# Patient Record
Sex: Female | Born: 1975 | Race: Black or African American | Hispanic: No | Marital: Married | Smoking: Current some day smoker
Health system: Southern US, Community
[De-identification: ages and names within clinical notes are randomized; demographics above are authoritative.]

## PROBLEM LIST (undated history)

## (undated) ENCOUNTER — Inpatient Hospital Stay (HOSPITAL_COMMUNITY): Payer: Self-pay

## (undated) DIAGNOSIS — R4781 Slurred speech: Secondary | ICD-10-CM

## (undated) DIAGNOSIS — R739 Hyperglycemia, unspecified: Secondary | ICD-10-CM

## (undated) DIAGNOSIS — O24419 Gestational diabetes mellitus in pregnancy, unspecified control: Secondary | ICD-10-CM

## (undated) DIAGNOSIS — I639 Cerebral infarction, unspecified: Secondary | ICD-10-CM

## (undated) DIAGNOSIS — Z8632 Personal history of gestational diabetes: Secondary | ICD-10-CM

## (undated) DIAGNOSIS — L729 Follicular cyst of the skin and subcutaneous tissue, unspecified: Secondary | ICD-10-CM

## (undated) DIAGNOSIS — I10 Essential (primary) hypertension: Secondary | ICD-10-CM

## (undated) HISTORY — PX: TUBAL LIGATION: SHX77

## (undated) HISTORY — DX: Slurred speech: R47.81

## (undated) HISTORY — DX: Hyperglycemia, unspecified: R73.9

## (undated) HISTORY — DX: Cerebral infarction, unspecified: I63.9

## (undated) HISTORY — DX: Gestational diabetes mellitus in pregnancy, unspecified control: O24.419

---

## 2006-02-25 ENCOUNTER — Other Ambulatory Visit: Payer: Self-pay

## 2012-05-23 DIAGNOSIS — K219 Gastro-esophageal reflux disease without esophagitis: Secondary | ICD-10-CM | POA: Insufficient documentation

## 2012-05-23 DIAGNOSIS — E559 Vitamin D deficiency, unspecified: Secondary | ICD-10-CM | POA: Insufficient documentation

## 2012-09-05 ENCOUNTER — Emergency Department (HOSPITAL_COMMUNITY): Payer: Medicaid Other

## 2012-09-05 ENCOUNTER — Emergency Department (HOSPITAL_COMMUNITY)
Admission: EM | Admit: 2012-09-05 | Discharge: 2012-09-05 | Disposition: A | Discharge status: Home / Self Care | Payer: Medicaid Other | Attending: Emergency Medicine | Admitting: Emergency Medicine

## 2012-09-05 ENCOUNTER — Encounter (HOSPITAL_COMMUNITY): Payer: Self-pay | Admitting: Family Medicine

## 2012-09-05 DIAGNOSIS — N72 Inflammatory disease of cervix uteri: Secondary | ICD-10-CM

## 2012-09-05 DIAGNOSIS — O2390 Unspecified genitourinary tract infection in pregnancy, unspecified trimester: Secondary | ICD-10-CM | POA: Insufficient documentation

## 2012-09-05 DIAGNOSIS — O2690 Pregnancy related conditions, unspecified, unspecified trimester: Secondary | ICD-10-CM | POA: Insufficient documentation

## 2012-09-05 DIAGNOSIS — Z3490 Encounter for supervision of normal pregnancy, unspecified, unspecified trimester: Secondary | ICD-10-CM

## 2012-09-05 LAB — POCT I-STAT, CHEM 8
Chloride: 105 mEq/L (ref 96–112)
HCT: 40 % (ref 36.0–46.0)
Potassium: 4.4 mEq/L (ref 3.5–5.1)
Sodium: 137 mEq/L (ref 135–145)

## 2012-09-05 LAB — CBC
HCT: 38.9 % (ref 36.0–46.0)
MCH: 29.5 pg (ref 26.0–34.0)
MCV: 88.4 fL (ref 78.0–100.0)
RBC: 4.4 MIL/uL (ref 3.87–5.11)
WBC: 6.3 10*3/uL (ref 4.0–10.5)

## 2012-09-05 LAB — URINALYSIS, ROUTINE W REFLEX MICROSCOPIC
Hgb urine dipstick: NEGATIVE
Ketones, ur: 15 mg/dL — AB
Specific Gravity, Urine: 1.031 — ABNORMAL HIGH (ref 1.005–1.030)
Urobilinogen, UA: 1 mg/dL (ref 0.0–1.0)

## 2012-09-05 LAB — URINE MICROSCOPIC-ADD ON

## 2012-09-05 LAB — WET PREP, GENITAL
Trich, Wet Prep: NONE SEEN
Yeast Wet Prep HPF POC: NONE SEEN

## 2012-09-05 LAB — HCG, QUANTITATIVE, PREGNANCY: hCG, Beta Chain, Quant, S: 23216 m[IU]/mL — ABNORMAL HIGH (ref <5)

## 2012-09-05 MED ORDER — AZITHROMYCIN 250 MG PO TABS
1000.0000 mg | ORAL_TABLET | Freq: Once | ORAL | Status: AC
  Administered 2012-09-05: 1000 mg via ORAL
  Filled 2012-09-05: qty 4

## 2012-09-05 MED ORDER — CEFTRIAXONE SODIUM 250 MG IJ SOLR
250.0000 mg | Freq: Once | INTRAMUSCULAR | Status: AC
  Administered 2012-09-05: 250 mg via INTRAMUSCULAR
  Filled 2012-09-05: qty 250

## 2012-09-05 MED ORDER — LIDOCAINE HCL (PF) 1 % IJ SOLN
INTRAMUSCULAR | Status: AC
  Filled 2012-09-05: qty 5

## 2012-09-05 NOTE — ED Provider Notes (Signed)
History     CSN: [id number]  Arrival date & time 09/05/12  1224   First MD Initiated Contact with Patient 09/05/12 1253      Chief Complaint  Patient presents with  . Abdominal Cramping  . Pelvic Pain    (Consider location/radiation/quality/duration/timing/severity/associated sxs/prior treatment) Patient is a 36 y.o. female presenting with cramps. The history is provided by the patient.  Abdominal Cramping The primary symptoms of the illness include abdominal pain. The primary symptoms of the illness do not include fever, nausea, vomiting, diarrhea, dysuria, vaginal discharge or vaginal bleeding. The current episode started more than 2 days ago. The onset of the illness was gradual. The problem has been gradually worsening.  The patient has not had a change in bowel habit. Symptoms associated with the illness do not include chills, anorexia, constipation, urgency, hematuria, frequency or back pain.  Pt states pain is in lower abdomen, cramping. States missed period last month. Last normal menstrual cycle Aug 18th. Did not take any medications for this. Nothing makes pain better or worse.   History reviewed. No pertinent past medical history.  History reviewed. No pertinent past surgical history.  History reviewed. No pertinent family history.  History  Substance Use Topics  . Smoking status: Not on file  . Smokeless tobacco: Not on file  . Alcohol Use: Not on file    OB History    Grav Para Term Preterm Abortions TAB SAB Ect Mult Living                  Review of Systems  Constitutional: Negative for fever and chills.  Respiratory: Negative.   Cardiovascular: Negative.   Gastrointestinal: Positive for abdominal pain. Negative for nausea, vomiting, diarrhea, constipation and anorexia.  Genitourinary: Positive for pelvic pain. Negative for dysuria, urgency, frequency, hematuria, vaginal bleeding, vaginal discharge and difficulty urinating.  Musculoskeletal: Negative for  back pain.  Neurological: Negative for dizziness, weakness and headaches.  Hematological: Negative.     Allergies  Review of patient's allergies indicates no known allergies.  Home Medications  No current outpatient prescriptions on file.  BP 129/72  Pulse 73  Temp 97.1 F (36.2 C) (Oral)  Resp 20  Ht 6' (1.829 m)  Wt 270 lb (122.471 kg)  BMI 36.62 kg/m2  SpO2 97%  LMP 07/22/2012  Physical Exam  Nursing note and vitals reviewed. Constitutional: She is oriented to person, place, and time. She appears well-developed and well-nourished. No distress.  HENT:  Head: Normocephalic.  Eyes: Conjunctivae normal are normal.  Neck: Neck supple.  Cardiovascular: Normal rate, regular rhythm and normal heart sounds.   Pulmonary/Chest: Effort normal and breath sounds normal. No respiratory distress. She has no wheezes. She has no rales.  Abdominal: Soft. Bowel sounds are normal. She exhibits no distension. There is no rebound and no guarding.       Suprapubic tenderness  Genitourinary:       Normal external genitalia. Normal vaginal canal with white chin discharge. Cerivix normal, close. No CMT. Uterine tenderness and bilateral adnexal tenderness present.  Musculoskeletal: She exhibits no edema.  Neurological: She is alert and oriented to person, place, and time.  Skin: Skin is warm and dry.  Psychiatric: She has a normal mood and affect.    ED Course  Procedures (including critical care time)  Results for orders placed during the hospital encounter of 09/05/12  URINALYSIS, ROUTINE W REFLEX MICROSCOPIC      Component Value Range   Color, Urine YELLOW  YELLOW  APPearance CLOUDY (*) CLEAR   Specific Gravity, Urine 1.031 (*) 1.005 - 1.030   pH 6.0  5.0 - 8.0   Glucose, UA NEGATIVE  NEGATIVE mg/dL   Hgb urine dipstick NEGATIVE  NEGATIVE   Bilirubin Urine SMALL (*) NEGATIVE   Ketones, ur 15 (*) NEGATIVE mg/dL   Protein, ur 30 (*) NEGATIVE mg/dL   Urobilinogen, UA 1.0  0.0 - 1.0  mg/dL   Nitrite NEGATIVE  NEGATIVE   Leukocytes, UA NEGATIVE  NEGATIVE  POCT PREGNANCY, URINE      Component Value Range   Preg Test, Ur POSITIVE (*) NEGATIVE  CBC      Component Value Range   WBC 6.3  4.0 - 10.5 K/uL   RBC 4.40  3.87 - 5.11 MIL/uL   Hemoglobin 13.0  12.0 - 15.0 g/dL   HCT 16.1  09.6 - 04.5 %   MCV 88.4  78.0 - 100.0 fL   MCH 29.5  26.0 - 34.0 pg   MCHC 33.4  30.0 - 36.0 g/dL   RDW 40.9  81.1 - 91.4 %   Platelets 283  150 - 400 K/uL  ABO/RH      Component Value Range   ABO/RH(D) B POS     No rh immune globuloin NOT A RH IMMUNE GLOBULIN CANDIDATE, PT RH POSITIVE    WET PREP, GENITAL      Component Value Range   Yeast Wet Prep HPF POC NONE SEEN  NONE SEEN   Trich, Wet Prep NONE SEEN  NONE SEEN   Clue Cells Wet Prep HPF POC NONE SEEN  NONE SEEN   WBC, Wet Prep HPF POC MANY (*) NONE SEEN  URINE MICROSCOPIC-ADD ON      Component Value Range   Squamous Epithelial / LPF RARE  RARE   WBC, UA 0-2  <3 WBC/hpf   RBC / HPF 0-2  <3 RBC/hpf   Bacteria, UA FEW (*) RARE   Crystals CA OXALATE CRYSTALS (*) NEGATIVE   Urine-Other MUCOUS PRESENT    POCT I-STAT, CHEM 8      Component Value Range   Sodium 137  135 - 145 mEq/L   Potassium 4.4  3.5 - 5.1 mEq/L   Chloride 105  96 - 112 mEq/L   BUN 11  6 - 23 mg/dL   Creatinine, Ser 7.82  0.50 - 1.10 mg/dL   Glucose, Bld 84  70 - 99 mg/dL   Calcium, Ion 9.56  2.13 - 1.23 mmol/L   TCO2 22  0 - 100 mmol/L   Hemoglobin 13.6  12.0 - 15.0 g/dL   HCT 08.6  57.8 - 46.9 %    3:19 PM Pt with positive pregnancy test. She is having some abdominal cramping. Estimated gestation about 7wks. Will get [place] to rule out ectopic pregnancy. Pt's vs normal. She is in no distress. No vaginal bleeding.   US Ob Comp Less 14 Wks  09/05/2012  *RADIOLOGY REPORT*  Clinical Data: Early pregnancy.  Evaluate for ectopic.  OBSTETRIC <14 WK [place] AND TRANSVAGINAL OB US  Technique:  Both transabdominal and transvaginal ultrasound examinations were performed for  complete evaluation of the gestation as well as the maternal uterus, adnexal regions, and pelvic cul-de-sac.  Transvaginal technique was performed to assess early pregnancy.  Comparison:  None  Intrauterine gestational sac:  Visualized/normal in shape. Yolk sac: Visualized Embryo: Visualized Cardiac Activity: Visualized Heart Rate: 118 bpm  CRL: 6.9 mm  6 w  4 d  [place] EDC: 04/27/2013  Maternal uterus/adnexae: Both ovaries are within normal limits.  The left ovary contains a probable corpus luteum.  No adnexal mass identified.  No free fluid is seen.  IMPRESSION: Living intrauterine pregnancy with estimated gestational age of [redacted] weeks 4 days and an EDC of 04/27/2013.  Ultrasound for fetal anatomic evaluation at 18 to [redacted] weeks gestational age is recommended.   Original Report Authenticated By: Britta Mccreedy, M.D.    US Ob Transvaginal  09/05/2012  *RADIOLOGY REPORT*  Clinical Data: Early pregnancy.  Evaluate for ectopic.  OBSTETRIC <14 WK [place] AND TRANSVAGINAL OB US  Technique:  Both transabdominal and transvaginal ultrasound examinations were performed for complete evaluation of the gestation as well as the maternal uterus, adnexal regions, and pelvic cul-de-sac.  Transvaginal technique was performed to assess early pregnancy.  Comparison:  None  Intrauterine gestational sac:  Visualized/normal in shape. Yolk sac: Visualized Embryo: Visualized Cardiac Activity: Visualized Heart Rate: 118 bpm  CRL: 6.9 mm  6 w  4 d         [place] EDC: 04/27/2013  Maternal uterus/adnexae: Both ovaries are within normal limits.  The left ovary contains a probable corpus luteum.  No adnexal mass identified.  No free fluid is seen.  IMPRESSION: Living intrauterine pregnancy with estimated gestational age of [redacted] weeks 4 days and an EDC of 04/27/2013.  Ultrasound for fetal anatomic evaluation at 18 to [redacted] weeks gestational age is recommended.   Original Report Authenticated By: Britta Mccreedy, M.D.    4:02 PM Pt's [place] back showing a single  living intrauterine pregnancy at 6wk 4d, no other findings. Will treat for possible cervicitis. Follow up for further prenatal care with GYN.      1. Pregnancy   2. Cervicitis       MDM  Pt with lower abdominal cramping. Positive urine pregnancy. No vaginal bleeding on exam. Cervix closed. US obtained to r/o ectopic pregnancy, and appears normal with single living intrauterine pregnancy at 6wks 4 days. UA negative for infection. VS normal. Pt to d/c home with follow up outpatient.         Lottie Mussel, PA 09/05/12 1612

## 2012-09-05 NOTE — ED Notes (Signed)
Per pt sts lower abdominal pain and cramping. Denies vaginal discharge, bleeding, urinary symptoms. Denies N,V,D. sts LMP August 18th

## 2012-09-05 NOTE — ED Notes (Signed)
Pt transported to US via stretcher.  

## 2012-09-05 NOTE — ED Notes (Signed)
Phlebotomy at the bedside  

## 2012-09-05 NOTE — ED Provider Notes (Signed)
Medical screening examination/treatment/procedure(s) were performed by non-physician practitioner and as supervising physician I was immediately available for consultation/collaboration.  Cheri Guppy, MD 09/05/12 [phone]

## 2012-09-06 LAB — GC/CHLAMYDIA PROBE AMP, GENITAL: GC Probe Amp, Genital: NEGATIVE

## 2012-09-28 ENCOUNTER — Encounter (HOSPITAL_COMMUNITY): Payer: Self-pay

## 2012-09-28 ENCOUNTER — Inpatient Hospital Stay (HOSPITAL_COMMUNITY): Payer: Medicaid Other

## 2012-09-28 ENCOUNTER — Inpatient Hospital Stay (HOSPITAL_COMMUNITY)
Admission: AD | Admit: 2012-09-28 | Discharge: 2012-09-28 | Disposition: A | Discharge status: Home / Self Care | Payer: Medicaid Other | Source: Ambulatory Visit | Attending: Obstetrics & Gynecology | Admitting: Obstetrics & Gynecology

## 2012-09-28 DIAGNOSIS — N760 Acute vaginitis: Secondary | ICD-10-CM

## 2012-09-28 DIAGNOSIS — R109 Unspecified abdominal pain: Secondary | ICD-10-CM | POA: Insufficient documentation

## 2012-09-28 DIAGNOSIS — M549 Dorsalgia, unspecified: Secondary | ICD-10-CM | POA: Insufficient documentation

## 2012-09-28 DIAGNOSIS — B9689 Other specified bacterial agents as the cause of diseases classified elsewhere: Secondary | ICD-10-CM | POA: Insufficient documentation

## 2012-09-28 DIAGNOSIS — O2390 Unspecified genitourinary tract infection in pregnancy, unspecified trimester: Secondary | ICD-10-CM | POA: Insufficient documentation

## 2012-09-28 DIAGNOSIS — A499 Bacterial infection, unspecified: Secondary | ICD-10-CM | POA: Insufficient documentation

## 2012-09-28 DIAGNOSIS — Z9851 Tubal ligation status: Secondary | ICD-10-CM | POA: Insufficient documentation

## 2012-09-28 DIAGNOSIS — Z331 Pregnant state, incidental: Secondary | ICD-10-CM

## 2012-09-28 LAB — CBC
Hemoglobin: 12.4 g/dL (ref 12.0–15.0)
MCHC: 33.3 g/dL (ref 30.0–36.0)
RBC: 4.21 MIL/uL (ref 3.87–5.11)
WBC: 5.5 10*3/uL (ref 4.0–10.5)

## 2012-09-28 LAB — URINALYSIS, ROUTINE W REFLEX MICROSCOPIC
Ketones, ur: NEGATIVE mg/dL
Leukocytes, UA: NEGATIVE
Nitrite: NEGATIVE
Specific Gravity, Urine: 1.025 (ref 1.005–1.030)
pH: 6.5 (ref 5.0–8.0)

## 2012-09-28 LAB — WET PREP, GENITAL
Trich, Wet Prep: NONE SEEN
Yeast Wet Prep HPF POC: NONE SEEN

## 2012-09-28 MED ORDER — METRONIDAZOLE 500 MG PO TABS: 500.0000 mg | ORAL_TABLET | Freq: Two times a day (BID) | ORAL | Status: DC

## 2012-09-28 NOTE — MAU Provider Note (Signed)
History     CSN: [id number]  Arrival date and time: 09/28/12 1036   None     Chief Complaint  Patient presents with  . Abdominal Pain  . Back Pain   HPI Darlene Bennett is 36 y.o. [phone] [redacted]w[redacted]d weeks presenting with lower abdominal and back pain that began last night.  Hx of BTL 2007.  LMP 07/22/12.  Denies nausea, vomiting, diarrhea, constipation, abnormal bleeding or discharge.  Plans care at [facility]    History reviewed. No pertinent past medical history.  No past surgical history on file.  No family history on file.  History  Substance Use Topics  . Smoking status: Not on file  . Smokeless tobacco: Not on file  . Alcohol Use:     Allergies: No Known Allergies  Prescriptions prior to admission  Medication Sig Dispense Refill  . Prenatal Vit-Fe Fumarate-FA (PRENATAL MULTIVITAMIN) TABS Take 1 tablet by mouth daily.        Review of Systems  Constitutional: Negative.   HENT: Negative.   Gastrointestinal: Positive for abdominal pain (cramping). Negative for nausea, vomiting, diarrhea and constipation.  Genitourinary: Negative.        Neg for vaginal bleeding or discharge   Physical Exam   Blood pressure 139/76, pulse 74, temperature 98.5 F (36.9 C), temperature source Oral, resp. rate 18, height 5' 10.5" (1.791 m), weight 128.549 kg (283 lb 6.4 oz), last menstrual period 07/22/2012, SpO2 99.00%.  Physical Exam  Constitutional: She is oriented to person, place, and time. She appears well-developed and well-nourished. No distress.  HENT:  Head: Normocephalic.  Neck: Normal range of motion.  Cardiovascular: Normal rate.   Respiratory: Effort normal.  GI: Soft. She exhibits no distension and no mass. There is no tenderness. There is no rebound and no guarding.  Genitourinary: There is no tenderness or lesion on the right labia. There is no tenderness or lesion on the left labia. Uterus is enlarged (9-10 week size). Uterus is not tender. Right adnexum displays no mass,  no tenderness and no fullness. Left adnexum displays no mass, no tenderness and no fullness. No erythema or bleeding around the vagina. Vaginal discharge (small amount of white discharge with odor) found.  Neurological: She is alert and oriented to person, place, and time.  Skin: Skin is warm and dry.  Psychiatric: She has a normal mood and affect. Her behavior is normal.   Results for orders placed during the hospital encounter of 09/28/12 (from the past 24 hour(s))  URINALYSIS, ROUTINE W REFLEX MICROSCOPIC     Status: Normal   Collection Time   09/28/12 10:55 AM      Component Value Range   Color, Urine YELLOW  YELLOW   APPearance CLEAR  CLEAR   Specific Gravity, Urine 1.025  1.005 - 1.030   pH 6.5  5.0 - 8.0   Glucose, UA NEGATIVE  NEGATIVE mg/dL   Hgb urine dipstick NEGATIVE  NEGATIVE   Bilirubin Urine NEGATIVE  NEGATIVE   Ketones, ur NEGATIVE  NEGATIVE mg/dL   Protein, ur NEGATIVE  NEGATIVE mg/dL   Urobilinogen, UA 0.2  0.0 - 1.0 mg/dL   Nitrite NEGATIVE  NEGATIVE   Leukocytes, UA NEGATIVE  NEGATIVE  CBC     Status: Normal   Collection Time   09/28/12 11:27 AM      Component Value Range   WBC 5.5  4.0 - 10.5 K/uL   RBC 4.21  3.87 - 5.11 MIL/uL   Hemoglobin 12.4  12.0 - 15.0  g/dL   HCT 40.9  81.1 - 91.4 %   MCV 88.4  78.0 - 100.0 fL   MCH 29.5  26.0 - 34.0 pg   MCHC 33.3  30.0 - 36.0 g/dL   RDW 78.2  95.6 - 21.3 %   Platelets 277  150 - 400 K/uL  WET PREP, GENITAL     Status: Abnormal   Collection Time   09/28/12  1:35 PM      Component Value Range   Yeast Wet Prep HPF POC NONE SEEN  NONE SEEN   Trich, Wet Prep NONE SEEN  NONE SEEN   Clue Cells Wet Prep HPF POC FEW (*) NONE SEEN   WBC, Wet Prep HPF POC FEW (*) NONE SEEN     BLOOD TYPE FROM PREVIOUS RECORD IS B POSITIVE   MAU Course  Procedures  GC/CHL culture to lab  MDM   Assessment and Plan  A: Viable Intrauterine pregnancy at [redacted]w[redacted]d      Bacterial Vaginosis  P:  Flagyl 500mg  po bid X 1 week       Encouraged patient to begin prenatal care with doctor of her choice    Tylenol prn for cramping  Bernardine Langworthy,EVE M 09/28/2012, 2:06 PM

## 2012-09-28 NOTE — MAU Note (Signed)
Patient states she started having low back and lower abdominal pain last night that continues off and on. Denies bleeding, discharge, nausea or vomiting.

## 2012-09-29 LAB — GC/CHLAMYDIA PROBE AMP, GENITAL: GC Probe Amp, Genital: NEGATIVE

## 2012-10-02 ENCOUNTER — Other Ambulatory Visit (HOSPITAL_COMMUNITY): Payer: Self-pay | Admitting: Nurse Practitioner

## 2012-10-02 DIAGNOSIS — Z3682 Encounter for antenatal screening for nuchal translucency: Secondary | ICD-10-CM

## 2012-10-02 LAB — OB RESULTS CONSOLE HEPATITIS B SURFACE ANTIGEN: Hepatitis B Surface Ag: NEGATIVE

## 2012-10-02 LAB — OB RESULTS CONSOLE HGB/HCT, BLOOD: HCT: 40 %

## 2012-10-02 LAB — OB RESULTS CONSOLE RPR: RPR: NONREACTIVE

## 2012-10-02 LAB — OB RESULTS CONSOLE ANTIBODY SCREEN: Antibody Screen: NEGATIVE

## 2012-10-02 LAB — OB RESULTS CONSOLE ABO/RH: RH Type: POSITIVE

## 2012-10-02 LAB — OB RESULTS CONSOLE PLATELET COUNT: Platelets: 272 10*3/uL

## 2012-10-02 LAB — OB RESULTS CONSOLE HIV ANTIBODY (ROUTINE TESTING): HIV: NONREACTIVE

## 2012-10-11 DIAGNOSIS — O09529 Supervision of elderly multigravida, unspecified trimester: Secondary | ICD-10-CM

## 2012-10-11 DIAGNOSIS — O09899 Supervision of other high risk pregnancies, unspecified trimester: Secondary | ICD-10-CM

## 2012-10-18 ENCOUNTER — Ambulatory Visit (HOSPITAL_COMMUNITY): Admission: RE | Admit: 2012-10-18 | Payer: Medicaid Other | Source: Ambulatory Visit

## 2012-10-18 ENCOUNTER — Ambulatory Visit (HOSPITAL_COMMUNITY)
Admission: RE | Admit: 2012-10-18 | Discharge: 2012-10-18 | Disposition: A | Discharge status: Home / Self Care | Payer: Medicaid Other | Source: Ambulatory Visit | Attending: Nurse Practitioner | Admitting: Nurse Practitioner

## 2012-10-18 ENCOUNTER — Encounter (HOSPITAL_COMMUNITY): Payer: Self-pay

## 2012-10-18 DIAGNOSIS — O09299 Supervision of pregnancy with other poor reproductive or obstetric history, unspecified trimester: Secondary | ICD-10-CM | POA: Insufficient documentation

## 2012-10-18 DIAGNOSIS — IMO0002 Reserved for concepts with insufficient information to code with codable children: Secondary | ICD-10-CM

## 2012-10-18 DIAGNOSIS — Z8751 Personal history of pre-term labor: Secondary | ICD-10-CM | POA: Insufficient documentation

## 2012-10-18 DIAGNOSIS — Z3682 Encounter for antenatal screening for nuchal translucency: Secondary | ICD-10-CM

## 2012-10-18 DIAGNOSIS — O3510X0 Maternal care for (suspected) chromosomal abnormality in fetus, unspecified, not applicable or unspecified: Secondary | ICD-10-CM | POA: Insufficient documentation

## 2012-10-18 DIAGNOSIS — O09529 Supervision of elderly multigravida, unspecified trimester: Secondary | ICD-10-CM | POA: Insufficient documentation

## 2012-10-18 DIAGNOSIS — Z3689 Encounter for other specified antenatal screening: Secondary | ICD-10-CM

## 2012-10-18 DIAGNOSIS — O351XX0 Maternal care for (suspected) chromosomal abnormality in fetus, not applicable or unspecified: Secondary | ICD-10-CM | POA: Insufficient documentation

## 2012-10-18 NOTE — Progress Notes (Signed)
[facility]  [age] yr old [phone] at [redacted]w[redacted]d with likely chronic hypertension, history of preeclampsia, and previous preterm delivery, for first trimester screen.  Findings: 1. Single intrauterine pregnancy. 2. Fetal crown rump length is consistent with dating. 3. Normal uterus; no adnexal masses seen. 4. Evaluation of fetal anatomy is limited by early gestational age. 5. Normal nuchal translucency measuring 2.49mm. 6. The nasal bone is visualized.  Recommendations: 1. Advanced maternal age:  - meet with genetic counselor; see separate report - after counseling patient declined all aneuploidy screening 2. Hypertension: - no consult requested - recommend fetal growth every 4 weeks starting at [redacted] weeks gestation - recommend antenatal testing starting at [redacted] weeks gestation 3. History of preeclampsia: - no consult requested - recommend obtain baseline 24 hour urine, AST, ALT, CBC, uric acid - recommend close surveillance for the development of signs/symptoms of preeclampsia 4. Possible history of growth restricted baby: - patient is unsure - based on the records appears may have had 4lb14oz baby at 37 weeks - given history of preeclampsia and fetal growth restriction may consider low dose aspirin in this pregnancy to reduce the recurrence risks 5. Recommend fetal anatomic survey at 18-[redacted] weeks gestation  Eulis Foster, MD

## 2012-10-18 NOTE — Progress Notes (Signed)
Genetic Counseling  High-Risk Gestation Note  Appointment Date:  10/18/2012 Referred By: Arvella Nigh, NP Date of Birth:  15-Jul-1976 Partner:  Lonzo Candy     Pregnancy History: [id number] Estimated Date of Delivery: 04/28/13 Estimated Gestational Age: [redacted]w[redacted]d Attending: Eulis Foster, MD   Darlene Bennett and her partner, Mr. Lonzo Candy, were seen for genetic counseling regarding a maternal age of 36 y.o.Marland Kitchen  They were counseled regarding maternal age and the association with risk for chromosome conditions due to nondisjunction with aging of the ova.   We reviewed chromosomes, nondisjunction, and the associated 1 in 67 risk for fetal aneuploidy at [redacted]w[redacted]d gestation related to a maternal age of 36 y.o. at delivery.  They were counseled that the risk for aneuploidy decreases as gestational age increases, accounting for those pregnancies which spontaneously abort.  We specifically discussed Down syndrome (trisomy 14), trisomies 13 and 6, and sex chromosome aneuploidies (47,XXX and 47,XXY) including the common features and prognoses of each.     We reviewed other available screening options including first trimester screening, noninvasive prenatal testing (NIPT), Quad screening, and detailed ultrasound.  Specifically, we discussed that NIPT analyzes cell free fetal DNA found in the maternal circulation. This test is not diagnostic for chromosome conditions, but can provide information regarding the presence or absence of extra fetal DNA for chromosomes 13, 18, 21, X, and Y, and missing fetal DNA for chromosome X and Y (Turner syndrome). Thus, it would not identify or rule out all genetic conditions. The reported detection rate is greater than 99% for Trisomy 21, greater than 98% for Trisomy 18, and is approximately 80% (8 out of 10) for Trisomy 13. The false positive rate is reported to be less than 0.1% for any of these conditions.  In addition, we discussed that ~50-80% of fetuses with Down syndrome and  up to 90-95% of fetuses with trisomy 18/13, when well visualized, have detectable anomalies or soft markers by detailed ultrasound (~18+ weeks gestation).   This couple was also counseled regarding diagnostic testing via chorionic villus sampling (CVS) and amniocentesis.  We reviewed the approximate 1 in 100 risk for complications with CVS and the approximate 1 in 300-500 risk for complications with amniocentesis, including spontaneous pregnancy loss. After consideration of all the options, they elected to proceed with ultrasound only and declined additional screening for aneuploidy at this time including First trimester screening, NIPT, and Quad screening stating that they were comfortable with the age related chance for fetal chromosome conditions at this time. The couple declined CVS and amniocentesis given the associated risk of complications. Ultrasound performed at the time of today's visit visualized nuchal translucency measurement within normal range. Complete ultrasound results reported separately. Detailed ultrasound was scheduled for 12/03/12.   Ms. Claretha Townshend was provided with written information regarding sickle cell anemia (SCA) including the carrier frequency and incidence in the African-American population, the availability of carrier testing and prenatal diagnosis if indicated.  In addition, we discussed that hemoglobinopathies are routinely screened for as part of the [place] newborn screening panel.  She was unsure if this screening had been performed in the past and declined hemoglobin electrophoresis today.    Both family histories were reviewed and found to be noncontributory for birth defects, mental retardation, and known genetic conditions. Without further information regarding the provided family history, an accurate genetic risk cannot be calculated. Further genetic counseling is warranted if more information is obtained.  Ms. Cleere denied exposure to environmental toxins or chemical  agents.  She denied the use of alcohol or street drugs. She reported smoking approximately 1 cigarette per day. The associations of smoking in pregnancy were reviewed and cessation encouraged. She denied significant viral illnesses during the course of her pregnancy. Her medical and surgical histories were contributory for two previous infants with low birth weight.     I counseled this couple regarding the above risks and available options.  The approximate face-to-face time with the genetic counselor was 40 minutes.    Quinn Plowman, MS,  Certified Genetic Counselor 10/18/2012

## 2012-10-19 DIAGNOSIS — O09899 Supervision of other high risk pregnancies, unspecified trimester: Secondary | ICD-10-CM | POA: Insufficient documentation

## 2012-10-22 DIAGNOSIS — Z8632 Personal history of gestational diabetes: Secondary | ICD-10-CM | POA: Insufficient documentation

## 2012-10-22 DIAGNOSIS — O09529 Supervision of elderly multigravida, unspecified trimester: Secondary | ICD-10-CM | POA: Insufficient documentation

## 2012-10-22 DIAGNOSIS — O09299 Supervision of pregnancy with other poor reproductive or obstetric history, unspecified trimester: Secondary | ICD-10-CM | POA: Insufficient documentation

## 2012-10-25 ENCOUNTER — Encounter: Payer: Self-pay | Admitting: Obstetrics & Gynecology

## 2012-10-25 ENCOUNTER — Ambulatory Visit (INDEPENDENT_AMBULATORY_CARE_PROVIDER_SITE_OTHER): Payer: Medicaid Other | Admitting: Obstetrics & Gynecology

## 2012-10-25 VITALS — BP 133/84 | Temp 98.3°F | Wt 294.1 lb

## 2012-10-25 DIAGNOSIS — O0990 Supervision of high risk pregnancy, unspecified, unspecified trimester: Secondary | ICD-10-CM

## 2012-10-25 DIAGNOSIS — O09899 Supervision of other high risk pregnancies, unspecified trimester: Secondary | ICD-10-CM

## 2012-10-25 DIAGNOSIS — O09219 Supervision of pregnancy with history of pre-term labor, unspecified trimester: Secondary | ICD-10-CM

## 2012-10-25 LAB — COMPREHENSIVE METABOLIC PANEL
AST: 33 U/L (ref 0–37)
Albumin: 3.8 g/dL (ref 3.5–5.2)
BUN: 9 mg/dL (ref 6–23)
Calcium: 9.8 mg/dL (ref 8.4–10.5)
Chloride: 104 mEq/L (ref 96–112)
Glucose, Bld: 77 mg/dL (ref 70–99)
Potassium: 4.7 mEq/L (ref 3.5–5.3)
Sodium: 138 mEq/L (ref 135–145)
Total Protein: 7.1 g/dL (ref 6.0–8.3)

## 2012-10-25 LAB — POCT URINALYSIS DIP (DEVICE)
Leukocytes, UA: NEGATIVE
Protein, ur: 100 mg/dL — AB
Urobilinogen, UA: 0.2 mg/dL (ref 0.0–1.0)

## 2012-10-25 NOTE — Progress Notes (Signed)
Pulse- 87 Weight gain 11-20 lbs New ob packet given, cessation material given

## 2012-10-25 NOTE — Patient Instructions (Addendum)
Sterilization Information, Female Female sterilization is a procedure to permanently prevent pregnancy. There are different ways to perform sterilization, but all either block or close the fallopian tubes so that your eggs cannot reach your uterus. If your egg cannot reach your uterus, sperm cannot fertilize the egg, and you cannot get pregnant.  Sterilization is performed by a surgical procedure. Sometimes these procedures are performed in a hospital while a patient is asleep. Sometimes they can be done in a clinic setting with the patient awake. The fallopian tubes can be surgically cut, tied, or sealed through a procedure called tubal ligation. The fallopian tubes can also be closed with clips or rings. Sterilization can also be done by placing a tiny coil into each fallopian tube, which causes scar tissue to grow inside the tube. The scar tissue then blocks the tubes.  Discuss sterilization with your caregiver to answer any concerns you or your partner may have. You may want to ask what type of sterilization your caregiver performs. Some caregivers may not perform all the various options. Sterilization is permanent and should only be done if you are sure you do not want children or do not want any more children. Having a sterilization reversed may not be successful.  STERILIZATION PROCEDURES  Laparoscopic sterilization. This is a surgical method performed at a time other than right after childbirth. Two incisions are made in the lower abdomen. A thin, lighted tube (laparoscope) is inserted into one of the incisions and is used to perform the procedure. The fallopian tubes are closed with a ring or a clip. An instrument that uses heat could be used to seal the tubes closed (electrocautery).   Mini-laparotomy. This is a surgical method done 1 or 2 days after giving birth. Typically, a small incision is made just below the belly button (umbilicus) and the fallopian tubes are exposed. The tubes can then be  sealed, tied, or cut.   Hysteroscopic sterilization. This is performed at a time other than right after childbirth. A tiny, spring-like coil is inserted through the cervix and uterus and placed into the fallopian tubes. The coil causes scaring and blocks the tubes. Other forms of contraception should be used for 3 months after the procedure to allow the scar tissue to form completely. Additionally, it is required hysterosalpingography be done 3 months later to ensure that the procedure was successful. Hysterosalpingography is a procedure that uses X-rays to look at your uterus and fallopian tubes after a material to make them show up better has been inserted. IS STERILIZATION SAFE? Sterilization is considered safe with very rare complications. Risks depend on the type of procedure you have. As with any surgical procedure, there are risks. Some risks of sterilization by any means include:   Bleeding.  Infection.  Reaction to anesthesia medicine.  Injury to surrounding organs. Risks specific to having hysteroscopic coils placed include:  The coils may not be placed correctly the first time.   The coils may move out of place.   The tubes may not get completely blocked after 3 months.   Injury to surrounding organs when placing the coil.  HOW EFFECTIVE IS FEMALE STERILIZATION? Sterilization is nearly 100% effective, but it can fail. Depending on the type of sterilization, the rate of failure can be as high as 3%. After hysteroscopic sterilization with placement of fallopian tube coils, you will need back-up birth control for 3 months after the procedure. Sterilization is effective for a lifetime.  BENEFITS OF STERILIZATION  It does   not affect your hormones, and therefore will not affect your menstrual periods, sexual desire, or performance.   It is effective for a lifetime.   It is safe.   You do not need to worry about getting pregnant. Keep in mind that if you had the  hysteroscopic placement procedure, you must wait 3 months after the procedure (or until your caregiver confirms) before pregnancy is not considered possible.   There are no side effects unlike other types of birth control (contraception).  DRAWBACKS OF STERILIZATION  You must be sure you do not want children or any more children. The procedure is permanent.   It does not provide protection against sexually transmitted infections (STIs).   The tubes can grow back together. If this happens, there is a risk of pregnancy. There is also an increased risk (50%) of pregnancy being an ectopic pregnancy. This is a pregnancy that happens outside of the uterus. Document Released: 05/09/2008 Document Revised: 05/22/2012 Document Reviewed: 03/08/2012 [facility] Patient Information 2013 [place], [place]. Vasovasostomy The sterility from a vasectomy should be thought of as permanent. However, vasovasostomy is a reversal procedure that may restore fertility in some men. It can also relieve chronic pain from vasectomies. This pain occurs very rarely. The procedure may offer some chance for pregnancy even in women over 35. PROCEDURE   The procedure involves sewing together the two tiny ends of two tubes. Each tube has a pinhead-sized opening. This would be the same as you splicing your garden hose, but a very small one.  It can usually be done on an outpatient basis. Patients can usually return to work within one to two weeks. It is more difficult and expensive than a vasectomy. It is even costlier if it involves connecting the vas to the epididymis (another one of the tubes which carries the sperm). This procedure usually takes about three hours.  Reversal surgery is usually not covered by health insurance.  The results may not be known for some time. The surgeon may view the surgical site using magnification instruments. This is called microscopic vasovasostomy. Microscopic techniques may improve the chance  of a reversal's success. Macroscopic vasovasostomy has a slightly lower success rate. However pregnancy rates can still be over 50% with a macroscopic procedure. A macroscopic procedure:  Is less expensive.  Has a shorter operating time. The microscopic approach is preferable for repeat surgery when an initial procedure failed. SUCCESS IS MORE LIKELY WHEN:  The section removed during vasectomy was not long.  The original procedure was performed on straight sections of the vas deferens.  The pieces being joined together are of equal size.  The vasectomy was recent.  The female partner does not change female partners after the procedure. CAUSES OF FAILURE  Various factors play a role in the failure of reversal surgery.  If the sperm count does not recover, it is often due to blockage from scarring in the epididymis. This may be corrected with a second procedure.  The body develops immune system responses that kill its own sperm after a vasectomy. This cannot be reversed.  Immune system responses may injure sperm DNA, contributing to infertility.  Inadequate technology available or specialty care not available.  Remarriage seems to make the procedure less likely to be successful. FREEZING SPERM PRIOR TO VASECTOMY Freezing sperm prior to vasectomy or during vasovasostomy may help if the reversal process fails. This sperm can be used later with artificial methods. Men should discuss these options with their doctor. REVERSAL SURGERY VERSUS  ASSISTED REPRODUCTION  Vasovasostomy is still a better choice than assisted reproductive technologies (ART). Success rates with reversal surgeries are improving. The costs are lower than with ART. In addition, the reversal process does not pose a risk for multiple births. Even for men who have failed reversal procedure, a repeat procedure appears to be less expensive than starting fertility treatments. ART may be a better approach than reversal for men  whose immune system attacks sperm due to vasectomy.  Document Released: 02/11/2003 Document Revised: 02/13/2012 Document Reviewed: 06/11/2008 [facility] Patient Information 2013 [place], [place].

## 2012-10-25 NOTE — Progress Notes (Signed)
Nutrition Note: 1st visit consult Pt with h/o GDM & preeclampsia in previous pregnancies & obesity.  Pt has gained 21.1# @ [redacted]w[redacted]d, which is > expected. Pt reports eating 'all throughout day' & is unsure of how many times. Pt reports drinking water, juice & milk daily and tea & Pepsi occ. Pt reports taking PNV. Pt reports no N&V but heartburn occ. Pt reports NKFA. Pt reports walking some days. Provided pt with verbal & written education on general nutrition during pregnancy & disc tips to decrease heartburn. Disc BF & importance of exclusive BF for as long as she can before introducing formula. Disc wt gain goals of 11-20# or 0.5#/wk. Pt agrees to cont PNV & try to decrease soda/ tea. Pt receives [facility] & plans to BF. F/u if referred Blondell Reveal, MS, RD, LDN

## 2012-10-25 NOTE — Progress Notes (Signed)
Referred from [facility] due to SGA babies. Had GDM and preeclampsia in the past. Negative 1 hr GTT this preg.  Subjective:    Darlene Bennett is a [id number] [redacted]w[redacted]d being seen today for her first obstetrical visit.  Her obstetrical history is significant for advanced maternal age, obesity and history of severe preeclampsia, LBW, gestational diabetes. Failed BTL last pregnancy. Patient does intend to breast feed. Pregnancy history fully reviewed.  Patient reports no complaints.  Filed Vitals:   10/25/12 0918  BP: 133/84  Temp: 98.3 F (36.8 C)  Weight: 294 lb 1.6 oz (133.403 kg)    HISTORY: OB History    Grav Para Term Preterm Abortions TAB SAB Ect Mult Living   4 3 2 1  0 0 0 0 0 3     # Outc Date GA Lbr Len/2nd Wgt Sex Del Anes PTL Lv   1 TRM 2/90 [redacted]w[redacted]d  7lb(3.175kg) F SVD EPI  Yes   2 PRE 3/00 [redacted]w[redacted]d 14:00 4lb5.5oz(1.97kg) M SVD EPI Yes Yes   Comments: Gestational diabetic   3 TRM 3/07 [redacted]w[redacted]d 15:00 5lb0.2oz(2.274kg) M SVD EPI No    4 CUR              Past Medical History  Diagnosis Date  . History of gestational diabetes in prior pregnancy, currently pregnant   . Gestational diabetes    History reviewed. No pertinent past surgical history. Family History  Problem Relation Age of Onset  . Heart disease Mother   . Hypertension Mother   . Diabetes Mother   . Hypertension Father   . Diabetes Father      Exam    Uterus:  Fundal Height: 14 cm  Pelvic Exam:                               System:     Skin: normal coloration and turgor, no rashes    Neurologic: oriented, normal   Extremities: normal strength, tone, and muscle mass       Mouth/Teeth mucous membranes moist, pharynx normal without lesions and dental hygiene good   Neck supple       Respiratory:  appears well, vitals normal, no respiratory distress, acyanotic, normal RR   Abdomen: soft, non-tender; bowel sounds normal; no masses,  no organomegaly          Assessment:    Pregnancy: [id number] Patient Active  Problem List  Diagnosis  . History of preterm delivery, currently pregnant  . AMA (advanced maternal age) multigravida 35+  . History of gestational diabetes in prior pregnancy, currently pregnant  . Supervision of high-risk pregnancy  H/O LBW Normal NT , declines further testing    Plan:     Initial labs drawn. Prenatal vitamins. Problem list reviewed and updated. Genetic Screening discussed First Screen: declined.  Ultrasound discussed; fetal survey: requested.  Follow up in 3 weeks. 50% of 30 min visit spent on counseling and coordination of care.  CMP and 24 hr urine   Darlene Bennett 10/25/2012

## 2012-10-27 LAB — PROTEIN, URINE, 24 HOUR: Protein, Urine: 17 mg/dL

## 2012-11-15 ENCOUNTER — Ambulatory Visit (INDEPENDENT_AMBULATORY_CARE_PROVIDER_SITE_OTHER): Payer: Medicaid Other | Admitting: Obstetrics & Gynecology

## 2012-11-15 VITALS — BP 138/83 | Temp 97.4°F | Wt 299.8 lb

## 2012-11-15 DIAGNOSIS — O09529 Supervision of elderly multigravida, unspecified trimester: Secondary | ICD-10-CM

## 2012-11-15 DIAGNOSIS — O09219 Supervision of pregnancy with history of pre-term labor, unspecified trimester: Secondary | ICD-10-CM

## 2012-11-15 DIAGNOSIS — O09899 Supervision of other high risk pregnancies, unspecified trimester: Secondary | ICD-10-CM

## 2012-11-15 DIAGNOSIS — O09299 Supervision of pregnancy with other poor reproductive or obstetric history, unspecified trimester: Secondary | ICD-10-CM | POA: Insufficient documentation

## 2012-11-15 DIAGNOSIS — O10019 Pre-existing essential hypertension complicating pregnancy, unspecified trimester: Secondary | ICD-10-CM

## 2012-11-15 DIAGNOSIS — O0990 Supervision of high risk pregnancy, unspecified, unspecified trimester: Secondary | ICD-10-CM

## 2012-11-15 DIAGNOSIS — I10 Essential (primary) hypertension: Secondary | ICD-10-CM | POA: Insufficient documentation

## 2012-11-15 LAB — POCT URINALYSIS DIP (DEVICE)
Ketones, ur: NEGATIVE mg/dL
Leukocytes, UA: NEGATIVE
Nitrite: NEGATIVE
Protein, ur: NEGATIVE mg/dL

## 2012-11-15 MED ORDER — ASPIRIN 81 MG PO TBEC: 81.0000 mg | DELAYED_RELEASE_TABLET | Freq: Every day | ORAL | Status: DC

## 2012-11-15 NOTE — Progress Notes (Signed)
p-87 

## 2012-11-15 NOTE — Progress Notes (Signed)
Discussed all recommendations of MFM to patient, patient wants to start Aspirin 81 mg po daily, this was prescribed.  No other complaints or concerns. Preeclampsia and obstetric precautions reviewed.

## 2012-11-15 NOTE — Patient Instructions (Signed)

## 2012-12-03 ENCOUNTER — Ambulatory Visit (HOSPITAL_COMMUNITY)
Admission: RE | Admit: 2012-12-03 | Discharge: 2012-12-03 | Disposition: A | Discharge status: Home / Self Care | Payer: Medicaid Other | Source: Ambulatory Visit | Attending: Obstetrics & Gynecology | Admitting: Obstetrics & Gynecology

## 2012-12-03 VITALS — BP 134/82 | HR 89 | Wt 297.0 lb

## 2012-12-03 DIAGNOSIS — O99210 Obesity complicating pregnancy, unspecified trimester: Secondary | ICD-10-CM | POA: Insufficient documentation

## 2012-12-03 DIAGNOSIS — IMO0002 Reserved for concepts with insufficient information to code with codable children: Secondary | ICD-10-CM

## 2012-12-03 DIAGNOSIS — Z3689 Encounter for other specified antenatal screening: Secondary | ICD-10-CM

## 2012-12-03 DIAGNOSIS — O09299 Supervision of pregnancy with other poor reproductive or obstetric history, unspecified trimester: Secondary | ICD-10-CM | POA: Insufficient documentation

## 2012-12-03 DIAGNOSIS — Z1389 Encounter for screening for other disorder: Secondary | ICD-10-CM | POA: Insufficient documentation

## 2012-12-03 DIAGNOSIS — Z8751 Personal history of pre-term labor: Secondary | ICD-10-CM | POA: Insufficient documentation

## 2012-12-03 DIAGNOSIS — Z363 Encounter for antenatal screening for malformations: Secondary | ICD-10-CM | POA: Insufficient documentation

## 2012-12-03 DIAGNOSIS — E669 Obesity, unspecified: Secondary | ICD-10-CM | POA: Insufficient documentation

## 2012-12-03 DIAGNOSIS — I10 Essential (primary) hypertension: Secondary | ICD-10-CM

## 2012-12-03 DIAGNOSIS — O10019 Pre-existing essential hypertension complicating pregnancy, unspecified trimester: Secondary | ICD-10-CM

## 2012-12-03 DIAGNOSIS — O358XX0 Maternal care for other (suspected) fetal abnormality and damage, not applicable or unspecified: Secondary | ICD-10-CM | POA: Insufficient documentation

## 2012-12-03 DIAGNOSIS — O09529 Supervision of elderly multigravida, unspecified trimester: Secondary | ICD-10-CM | POA: Insufficient documentation

## 2012-12-03 DIAGNOSIS — O09899 Supervision of other high risk pregnancies, unspecified trimester: Secondary | ICD-10-CM

## 2012-12-03 NOTE — Progress Notes (Signed)
Darlene Bennett  was seen today for an ultrasound appointment.  See full report in [facility].  Impression: Single IUP at 19 1/7 weeks Normal detailed fetal anatomy; somewhat limited views of the fetal heart were obtained due to fetal position (ductus, aortic arch) No markers associated with Down syndrome noted Normal amniotic fluid volume  Recommendations: Recommend follow-up ultrasound examination in 4 weeks for interval growth and to reevaluate fetal heart anatomy  Alpha Gula, MD

## 2012-12-05 NOTE — L&D Delivery Note (Signed)
Delivery Note At 1:11 PM a viable female was delivered via Vaginal, Spontaneous Delivery (Presentation: Occiput Anterior).  APGAR: 8, 9; weight .   Placenta status: Intact, Spontaneous.  Cord: 3 vessels with the following complications: loose nuchal, delivered through.  Cord pH: n/a  Anesthesia: Epidural  Episiotomy: None Lacerations: None Suture Repair: n/a Est. Blood Loss (mL): 250 cc  Mom to postpartum.  Baby to nursery-stable.  Levert Feinstein 04/10/2013, 1:33 PM

## 2012-12-05 NOTE — L&D Delivery Note (Signed)
Attended delivery and I agree with note  04/10/2013 4:15 PM Adam Phenix, MD

## 2012-12-06 ENCOUNTER — Ambulatory Visit (INDEPENDENT_AMBULATORY_CARE_PROVIDER_SITE_OTHER): Payer: Medicaid Other | Admitting: Obstetrics & Gynecology

## 2012-12-06 VITALS — BP 134/82 | Temp 97.6°F | Wt 295.8 lb

## 2012-12-06 DIAGNOSIS — O09299 Supervision of pregnancy with other poor reproductive or obstetric history, unspecified trimester: Secondary | ICD-10-CM

## 2012-12-06 DIAGNOSIS — O10019 Pre-existing essential hypertension complicating pregnancy, unspecified trimester: Secondary | ICD-10-CM

## 2012-12-06 DIAGNOSIS — Z8632 Personal history of gestational diabetes: Secondary | ICD-10-CM

## 2012-12-06 DIAGNOSIS — O09529 Supervision of elderly multigravida, unspecified trimester: Secondary | ICD-10-CM

## 2012-12-06 DIAGNOSIS — I10 Essential (primary) hypertension: Secondary | ICD-10-CM

## 2012-12-06 DIAGNOSIS — O0990 Supervision of high risk pregnancy, unspecified, unspecified trimester: Secondary | ICD-10-CM

## 2012-12-06 LAB — POCT URINALYSIS DIP (DEVICE)
Hgb urine dipstick: NEGATIVE
Leukocytes, UA: NEGATIVE
Nitrite: NEGATIVE
Protein, ur: NEGATIVE mg/dL
Urobilinogen, UA: 0.2 mg/dL (ref 0.0–1.0)
pH: 7 (ref 5.0–8.0)

## 2012-12-06 NOTE — Patient Instructions (Signed)
Return to clinic for any obstetric concerns or go to [facility] for evaluation  

## 2012-12-06 NOTE — Progress Notes (Signed)
Pulse: 88

## 2012-12-06 NOTE — Progress Notes (Signed)
Normal anatomy scan but inadequate cardiac visualization, scheduled for rescan at 23 weeks. No other complaints or concerns.   Obstetric precautions reviewed.

## 2012-12-27 ENCOUNTER — Ambulatory Visit (INDEPENDENT_AMBULATORY_CARE_PROVIDER_SITE_OTHER): Payer: Medicaid Other | Admitting: Obstetrics & Gynecology

## 2012-12-27 VITALS — BP 130/84 | Wt 294.6 lb

## 2012-12-27 DIAGNOSIS — O09899 Supervision of other high risk pregnancies, unspecified trimester: Secondary | ICD-10-CM

## 2012-12-27 DIAGNOSIS — O09219 Supervision of pregnancy with history of pre-term labor, unspecified trimester: Secondary | ICD-10-CM

## 2012-12-27 LAB — POCT URINALYSIS DIP (DEVICE)
Leukocytes, UA: NEGATIVE
Protein, ur: 30 mg/dL — AB
Urobilinogen, UA: 1 mg/dL (ref 0.0–1.0)
pH: 6.5 (ref 5.0–8.0)

## 2012-12-27 NOTE — Progress Notes (Signed)
BP nml today.  Not on meds.  Has f/u US next week with MFM to follow up anatomy.  Discussed brith control (pt had failed BTL at [facility]).  Maybe vasectomy for FOB.

## 2012-12-27 NOTE — Progress Notes (Signed)
Pulse 83 Has a tingling feeling in her hands in the morning. Concerned with why she doesn't feel movement yet.

## 2012-12-27 NOTE — Patient Instructions (Signed)
Sterilization Information, Female Female sterilization is a procedure to permanently prevent pregnancy. There are different ways to perform sterilization, but all either block or close the fallopian tubes so that your eggs cannot reach your uterus. If your egg cannot reach your uterus, sperm cannot fertilize the egg, and you cannot get pregnant.  Sterilization is performed by a surgical procedure. Sometimes these procedures are performed in a hospital while a patient is asleep. Sometimes they can be done in a clinic setting with the patient awake. The fallopian tubes can be surgically cut, tied, or sealed through a procedure called tubal ligation. The fallopian tubes can also be closed with clips or rings. Sterilization can also be done by placing a tiny coil into each fallopian tube, which causes scar tissue to grow inside the tube. The scar tissue then blocks the tubes.  Discuss sterilization with your caregiver to answer any concerns you or your partner may have. You may want to ask what type of sterilization your caregiver performs. Some caregivers may not perform all the various options. Sterilization is permanent and should only be done if you are sure you do not want children or do not want any more children. Having a sterilization reversed may not be successful.  STERILIZATION PROCEDURES  Laparoscopic sterilization. This is a surgical method performed at a time other than right after childbirth. Two incisions are made in the lower abdomen. A thin, lighted tube (laparoscope) is inserted into one of the incisions and is used to perform the procedure. The fallopian tubes are closed with a ring or a clip. An instrument that uses heat could be used to seal the tubes closed (electrocautery).   Mini-laparotomy. This is a surgical method done 1 or 2 days after giving birth. Typically, a small incision is made just below the belly button (umbilicus) and the fallopian tubes are exposed. The tubes can then be  sealed, tied, or cut.   Hysteroscopic sterilization. This is performed at a time other than right after childbirth. A tiny, spring-like coil is inserted through the cervix and uterus and placed into the fallopian tubes. The coil causes scaring and blocks the tubes. Other forms of contraception should be used for 3 months after the procedure to allow the scar tissue to form completely. Additionally, it is required hysterosalpingography be done 3 months later to ensure that the procedure was successful. Hysterosalpingography is a procedure that uses X-rays to look at your uterus and fallopian tubes after a material to make them show up better has been inserted. IS STERILIZATION SAFE? Sterilization is considered safe with very rare complications. Risks depend on the type of procedure you have. As with any surgical procedure, there are risks. Some risks of sterilization by any means include:   Bleeding.  Infection.  Reaction to anesthesia medicine.  Injury to surrounding organs. Risks specific to having hysteroscopic coils placed include:  The coils may not be placed correctly the first time.   The coils may move out of place.   The tubes may not get completely blocked after 3 months.   Injury to surrounding organs when placing the coil.  HOW EFFECTIVE IS FEMALE STERILIZATION? Sterilization is nearly 100% effective, but it can fail. Depending on the type of sterilization, the rate of failure can be as high as 3%. After hysteroscopic sterilization with placement of fallopian tube coils, you will need back-up birth control for 3 months after the procedure. Sterilization is effective for a lifetime.  BENEFITS OF STERILIZATION  It does   not affect your hormones, and therefore will not affect your menstrual periods, sexual desire, or performance.   It is effective for a lifetime.   It is safe.   You do not need to worry about getting pregnant. Keep in mind that if you had the  hysteroscopic placement procedure, you must wait 3 months after the procedure (or until your caregiver confirms) before pregnancy is not considered possible.   There are no side effects unlike other types of birth control (contraception).  DRAWBACKS OF STERILIZATION  You must be sure you do not want children or any more children. The procedure is permanent.   It does not provide protection against sexually transmitted infections (STIs).   The tubes can grow back together. If this happens, there is a risk of pregnancy. There is also an increased risk (50%) of pregnancy being an ectopic pregnancy. This is a pregnancy that happens outside of the uterus. Document Released: 05/09/2008 Document Revised: 05/22/2012 Document Reviewed: 03/08/2012 [facility] Patient Information 2013 [place], [place]. Contraception Choices Contraception (birth control) is the use of any methods or devices to prevent pregnancy. Below are some methods to help avoid pregnancy. HORMONAL METHODS   Contraceptive implant. This is a thin, plastic tube containing progesterone hormone. It does not contain estrogen hormone. Your caregiver inserts the tube in the inner part of the upper arm. The tube can remain in place for up to 3 years. After 3 years, the implant must be removed. The implant prevents the ovaries from releasing an egg (ovulation), thickens the cervical mucus which prevents sperm from entering the uterus, and thins the lining of the inside of the uterus.  Progesterone-only injections. These injections are given every 3 months by your caregiver to prevent pregnancy. This synthetic progesterone hormone stops the ovaries from releasing eggs. It also thickens cervical mucus and changes the uterine lining. This makes it harder for sperm to survive in the uterus.  Birth control pills. These pills contain estrogen and progesterone hormone. They work by stopping the egg from forming in the ovary (ovulation). Birth control  pills are prescribed by a caregiver.Birth control pills can also be used to treat heavy periods.  Minipill. This type of birth control pill contains only the progesterone hormone. They are taken every day of each month and must be prescribed by your caregiver.  Birth control patch. The patch contains hormones similar to those in birth control pills. It must be changed once a week and is prescribed by a caregiver.  Vaginal ring. The ring contains hormones similar to those in birth control pills. It is left in the vagina for 3 weeks, removed for 1 week, and then a new one is put back in place. The patient must be comfortable inserting and removing the ring from the vagina.A caregiver's prescription is necessary.  Emergency contraception. Emergency contraceptives prevent pregnancy after unprotected sexual intercourse. This pill can be taken right after sex or up to 5 days after unprotected sex. It is most effective the sooner you take the pills after having sexual intercourse. Emergency contraceptive pills are available without a prescription. Check with your pharmacist. Do not use emergency contraception as your only form of birth control. BARRIER METHODS   Female condom. This is a thin sheath (latex or rubber) that is worn over the penis during sexual intercourse. It can be used with spermicide to increase effectiveness.  Female condom. This is a soft, loose-fitting sheath that is put into the vagina before sexual intercourse.  Diaphragm. This is  a soft, latex, dome-shaped barrier that must be fitted by a caregiver. It is inserted into the vagina, along with a spermicidal jelly. It is inserted before intercourse. The diaphragm should be left in the vagina for 6 to 8 hours after intercourse.  Cervical cap. This is a round, soft, latex or plastic cup that fits over the cervix and must be fitted by a caregiver. The cap can be left in place for up to 48 hours after intercourse.  Sponge. This is a soft,  circular piece of polyurethane foam. The sponge has spermicide in it. It is inserted into the vagina after wetting it and before sexual intercourse.  Spermicides. These are chemicals that kill or block sperm from entering the cervix and uterus. They come in the form of creams, jellies, suppositories, foam, or tablets. They do not require a prescription. They are inserted into the vagina with an applicator before having sexual intercourse. The process must be repeated every time you have sexual intercourse. INTRAUTERINE CONTRACEPTION  Intrauterine device (IUD). This is a T-shaped device that is put in a woman's uterus during a menstrual period to prevent pregnancy. There are 2 types:  Copper IUD. This type of IUD is wrapped in copper wire and is placed inside the uterus. Copper makes the uterus and fallopian tubes produce a fluid that kills sperm. It can stay in place for 10 years.  Hormone IUD. This type of IUD contains the hormone progestin (synthetic progesterone). The hormone thickens the cervical mucus and prevents sperm from entering the uterus, and it also thins the uterine lining to prevent implantation of a fertilized egg. The hormone can weaken or kill the sperm that get into the uterus. It can stay in place for 5 years. PERMANENT METHODS OF CONTRACEPTION  Female tubal ligation. This is when the woman's fallopian tubes are surgically sealed, tied, or blocked to prevent the egg from traveling to the uterus.  Female sterilization. This is when the female has the tubes that carry sperm tied off (vasectomy).This blocks sperm from entering the vagina during sexual intercourse. After the procedure, the man can still ejaculate fluid (semen). NATURAL PLANNING METHODS  Natural family planning. This is not having sexual intercourse or using a barrier method (condom, diaphragm, cervical cap) on days the woman could become pregnant.  Calendar method. This is keeping track of the length of each menstrual  cycle and identifying when you are fertile.  Ovulation method. This is avoiding sexual intercourse during ovulation.  Symptothermal method. This is avoiding sexual intercourse during ovulation, using a thermometer and ovulation symptoms.  Post-ovulation method. This is timing sexual intercourse after you have ovulated. Regardless of which type or method of contraception you choose, it is important that you use condoms to protect against the transmission of sexually transmitted diseases (STDs). Talk with your caregiver about which form of contraception is most appropriate for you. Document Released: 11/21/2005 Document Revised: 02/13/2012 Document Reviewed: 03/30/2011 [facility] Patient Information 2013 [place], [place].

## 2012-12-31 ENCOUNTER — Ambulatory Visit (HOSPITAL_COMMUNITY)
Admission: RE | Admit: 2012-12-31 | Discharge: 2012-12-31 | Disposition: A | Discharge status: Home / Self Care | Payer: Medicaid Other | Source: Ambulatory Visit | Attending: Obstetrics & Gynecology | Admitting: Obstetrics & Gynecology

## 2012-12-31 ENCOUNTER — Encounter (HOSPITAL_COMMUNITY): Payer: Self-pay

## 2012-12-31 VITALS — BP 126/84 | HR 113 | Wt 294.0 lb

## 2012-12-31 DIAGNOSIS — O10019 Pre-existing essential hypertension complicating pregnancy, unspecified trimester: Secondary | ICD-10-CM

## 2012-12-31 DIAGNOSIS — IMO0002 Reserved for concepts with insufficient information to code with codable children: Secondary | ICD-10-CM

## 2012-12-31 DIAGNOSIS — O09899 Supervision of other high risk pregnancies, unspecified trimester: Secondary | ICD-10-CM

## 2012-12-31 DIAGNOSIS — I10 Essential (primary) hypertension: Secondary | ICD-10-CM

## 2012-12-31 DIAGNOSIS — E669 Obesity, unspecified: Secondary | ICD-10-CM | POA: Insufficient documentation

## 2012-12-31 DIAGNOSIS — O09529 Supervision of elderly multigravida, unspecified trimester: Secondary | ICD-10-CM

## 2012-12-31 DIAGNOSIS — Z8751 Personal history of pre-term labor: Secondary | ICD-10-CM | POA: Insufficient documentation

## 2012-12-31 DIAGNOSIS — O09299 Supervision of pregnancy with other poor reproductive or obstetric history, unspecified trimester: Secondary | ICD-10-CM

## 2013-01-17 ENCOUNTER — Encounter: Payer: Medicaid Other | Admitting: Obstetrics & Gynecology

## 2013-01-23 ENCOUNTER — Telehealth: Payer: Self-pay | Admitting: General Practice

## 2013-01-23 NOTE — Telephone Encounter (Signed)
Patient called and left message stating she would like a nurse to call her back. Called patient back and patient states she has been having tingling in her hands for the past couple weeks. Upon chart review patient voiced this concern at last prenatal visit, but don't see where the provider made a note. Told patient I didn't think it was anything urgent and that she should bring it up to the provider herself at her prenatal visit next week on 2/27. Patient verbalized understanding and had no further questions

## 2013-01-24 ENCOUNTER — Encounter (HOSPITAL_COMMUNITY): Payer: Self-pay

## 2013-01-24 ENCOUNTER — Inpatient Hospital Stay (HOSPITAL_COMMUNITY)
Admission: AD | Admit: 2013-01-24 | Discharge: 2013-01-24 | Disposition: A | Discharge status: Home / Self Care | Payer: Medicaid Other | Source: Ambulatory Visit | Attending: Obstetrics & Gynecology | Admitting: Obstetrics & Gynecology

## 2013-01-24 DIAGNOSIS — G5601 Carpal tunnel syndrome, right upper limb: Secondary | ICD-10-CM | POA: Diagnosis present

## 2013-01-24 DIAGNOSIS — O09899 Supervision of other high risk pregnancies, unspecified trimester: Secondary | ICD-10-CM

## 2013-01-24 DIAGNOSIS — O99891 Other specified diseases and conditions complicating pregnancy: Secondary | ICD-10-CM | POA: Insufficient documentation

## 2013-01-24 DIAGNOSIS — O09529 Supervision of elderly multigravida, unspecified trimester: Secondary | ICD-10-CM

## 2013-01-24 DIAGNOSIS — O09299 Supervision of pregnancy with other poor reproductive or obstetric history, unspecified trimester: Secondary | ICD-10-CM

## 2013-01-24 DIAGNOSIS — O10019 Pre-existing essential hypertension complicating pregnancy, unspecified trimester: Secondary | ICD-10-CM | POA: Insufficient documentation

## 2013-01-24 DIAGNOSIS — G5600 Carpal tunnel syndrome, unspecified upper limb: Secondary | ICD-10-CM | POA: Insufficient documentation

## 2013-01-24 DIAGNOSIS — R209 Unspecified disturbances of skin sensation: Secondary | ICD-10-CM | POA: Insufficient documentation

## 2013-01-24 NOTE — MAU Note (Signed)
Pt states r hand has numbness and tingling intermittently. At times will feel whole hand numbness, wrist pain earlier, no hx carpal tunnel. Denies abnormal vaginal discharge or bleeding.

## 2013-01-24 NOTE — MAU Provider Note (Signed)
History     CSN: [id number]  Arrival date and time: 01/24/13 1738   None     Chief Complaint  Patient presents with  . Numbness   HPI  37 year old F [id number] @ [redacted]w[redacted]d by LMP who is seen in high risk clinic for AMA, chronic HTN and history of pre-eclampsia who presents with right hand numbness and tingling. It has been present for about one month and is worsening. It effects her volar thumb, pointer and middle fingers. It is not associated with swelling or erythema of the hand. She denies a history of trauma or surgery to the RUE. The patient notes regular fetal movement and denies vaginal bleeding, LOF and contractions.    OB History   Grav Para Term Preterm Abortions TAB SAB Ect Mult Living   4 3 2 1  0 0 0 0 0 3      Patient Active Problem List  Diagnosis  . History of preterm delivery, currently pregnant  . AMA (advanced maternal age) multigravida 35+  . History of gestational diabetes in prior pregnancy, currently pregnant  . Supervision of high-risk pregnancy  . Benign essential hypertension antepartum  . Chronic hypertension  . Hx of preeclampsia, prior pregnancy, currently pregnant     Past Medical History  Diagnosis Date  . History of gestational diabetes in prior pregnancy, currently pregnant   . Gestational diabetes     Past Surgical History  Procedure Laterality Date  . Tubal ligation      Family History  Problem Relation Age of Onset  . Heart disease Mother   . Hypertension Mother   . Diabetes Mother   . Hypertension Father   . Diabetes Father     History  Substance Use Topics  . Smoking status: Current Every Day Smoker    Types: Cigarettes  . Smokeless tobacco: Never Used  . Alcohol Use: No    Allergies: No Known Allergies  Prescriptions prior to admission  Medication Sig Dispense Refill  . acetaminophen (TYLENOL) 500 MG tablet Take 500 mg by mouth every 6 (six) hours as needed.      Marland Kitchen aspirin 81 MG EC tablet Take 1 tablet (81 mg total)  by mouth daily. Swallow whole.  30 tablet  12  . Prenatal Vit-Fe Fumarate-FA (PRENATAL MULTIVITAMIN) TABS Take 1 tablet by mouth daily.        Review of Systems  Constitutional: Negative.   HENT: Negative.   Eyes: Negative.   Respiratory: Negative.   Cardiovascular: Negative.   Gastrointestinal: Negative.   Neurological: Positive for tingling.   Physical Exam   Blood pressure 129/77, pulse 99, temperature 98 F (36.7 C), temperature source Oral, resp. rate 18, height 6' (1.829 m), weight 135.796 kg (299 lb 6 oz), last menstrual period 07/22/2012.  Physical Exam  Constitutional: She is oriented to person, place, and time. She appears well-developed and well-nourished.  Obese; poor hygiene   HENT:  Head: Normocephalic and atraumatic.  Eyes: Pupils are equal, round, and reactive to light.  Cardiovascular: Normal rate and regular rhythm.   Respiratory: Effort normal and breath sounds normal.  GI: Soft. There is no tenderness.  Gravid  Neurological: She is alert and oriented to person, place, and time. She has normal strength.  Positive Phalen's sign of right wrist; no edema or erythema of right wrist; no atrophy of thenar comparment    MAU Course  Procedures  MDM Toco - no contractions FHM - Cat I tracing  Assessment and Plan  37 year old F [phone] @ [redacted]w[redacted]d by LMP who is seen in high risk clinic for AMA, chronic HTN and history of pre-eclampsia who presents with right hand numbness and tingling that is carpal tunnel syndrome. The patient was explained the diagnosis and will use a wrist splint on the right. Regular follow up in high risk clinic.    Mat Carne 01/24/2013, 8:25 PM   I have seen and examined this patient and agree the above assessment. CRESENZO-DISHMAN,Hema Lanza 01/25/2013 12:58 AM

## 2013-01-31 ENCOUNTER — Ambulatory Visit (INDEPENDENT_AMBULATORY_CARE_PROVIDER_SITE_OTHER): Payer: Medicaid Other | Admitting: Obstetrics & Gynecology

## 2013-01-31 ENCOUNTER — Other Ambulatory Visit: Payer: Self-pay | Admitting: Obstetrics & Gynecology

## 2013-01-31 VITALS — BP 134/84 | Temp 97.4°F | Wt 292.7 lb

## 2013-01-31 DIAGNOSIS — O09529 Supervision of elderly multigravida, unspecified trimester: Secondary | ICD-10-CM

## 2013-01-31 LAB — CBC
MCH: 30.3 pg (ref 26.0–34.0)
MCHC: 35.8 g/dL (ref 30.0–36.0)
MCV: 84.6 fL (ref 78.0–100.0)
Platelets: 325 10*3/uL (ref 150–400)

## 2013-01-31 LAB — POCT URINALYSIS DIP (DEVICE)
Ketones, ur: NEGATIVE mg/dL
Leukocytes, UA: NEGATIVE
Protein, ur: 30 mg/dL — AB
Specific Gravity, Urine: 1.025 (ref 1.005–1.030)
Urobilinogen, UA: 0.2 mg/dL (ref 0.0–1.0)
pH: 7 (ref 5.0–8.0)

## 2013-01-31 LAB — HIV ANTIBODY (ROUTINE TESTING W REFLEX): HIV: NONREACTIVE

## 2013-01-31 MED ORDER — PANTOPRAZOLE SODIUM 40 MG PO TBEC: 40.0000 mg | DELAYED_RELEASE_TABLET | Freq: Every day | ORAL | Status: DC

## 2013-01-31 NOTE — Progress Notes (Signed)
Heartburn, good movement.Rx Protonix

## 2013-01-31 NOTE — Patient Instructions (Signed)
Pregnancy - Third Trimester  The third trimester of pregnancy (the last 3 months) is a period of the most rapid growth for you and your baby. The baby approaches a length of 20 inches and a weight of 6 to 10 pounds. The baby is adding on fat and getting ready for life outside your body. While inside, babies have periods of sleeping and waking, suck their thumbs, and hiccups. You can often feel small contractions of the uterus. This is false labor. It is also called Braxton-Hicks contractions. This is like a practice for labor. The usual problems in this stage of pregnancy include more difficulty breathing, swelling of the hands and feet from water retention, and having to urinate more often because of the uterus and baby pressing on your bladder.   PRENATAL EXAMS  · Blood work may continue to be done during prenatal exams. These tests are done to check on your health and the probable health of your baby. Blood work is used to follow your blood levels (hemoglobin). Anemia (low hemoglobin) is common during pregnancy. Iron and vitamins are given to help prevent this. You may also continue to be checked for diabetes. Some of the past blood tests may be done again.  · The size of the uterus is measured during each visit. This makes sure your baby is growing properly according to your pregnancy dates.  · Your blood pressure is checked every prenatal visit. This is to make sure you are not getting toxemia.  · Your urine is checked every prenatal visit for infection, diabetes and protein.  · Your weight is checked at each visit. This is done to make sure gains are happening at the suggested rate and that you and your baby are growing normally.  · Sometimes, an ultrasound is performed to confirm the position and the proper growth and development of the baby. This is a test done that bounces harmless sound waves off the baby so your caregiver can more accurately determine due dates.  · Discuss the type of pain medication and  anesthesia you will have during your labor and delivery.  · Discuss the possibility and anesthesia if a Cesarean Section might be necessary.  · Inform your caregiver if there is any mental or physical violence at home.  Sometimes, a specialized non-stress test, contraction stress test and biophysical profile are done to make sure the baby is not having a problem. Checking the amniotic fluid surrounding the baby is called an amniocentesis. The amniotic fluid is removed by sticking a needle into the belly (abdomen). This is sometimes done near the end of pregnancy if an early delivery is required. In this case, it is done to help make sure the baby's lungs are mature enough for the baby to live outside of the womb. If the lungs are not mature and it is unsafe to deliver the baby, an injection of cortisone medication is given to the mother 1 to 2 days before the delivery. This helps the baby's lungs mature and makes it safer to deliver the baby.  CHANGES OCCURING IN THE THIRD TRIMESTER OF PREGNANCY  Your body goes through many changes during pregnancy. They vary from person to person. Talk to your caregiver about changes you notice and are concerned about.  · During the last trimester, you have probably had an increase in your appetite. It is normal to have cravings for certain foods. This varies from person to person and pregnancy to pregnancy.  · You may begin to   get stretch marks on your hips, abdomen, and breasts. These are normal changes in the body during pregnancy. There are no exercises or medications to take which prevent this change.  · Constipation may be treated with a stool softener or adding bulk to your diet. Drinking lots of fluids, fiber in vegetables, fruits, and whole grains are helpful.  · Exercising is also helpful. If you have been very active up until your pregnancy, most of these activities can be continued during your pregnancy. If you have been less active, it is helpful to start an exercise  program such as walking. Consult your caregiver before starting exercise programs.  · Avoid all smoking, alcohol, un-prescribed drugs, herbs and "street drugs" during your pregnancy. These chemicals affect the formation and growth of the baby. Avoid chemicals throughout the pregnancy to ensure the delivery of a healthy infant.  · Backache, varicose veins and hemorrhoids may develop or get worse.  · You will tire more easily in the third trimester, which is normal.  · The baby's movements may be stronger and more often.  · You may become short of breath easily.  · Your belly button may stick out.  · A yellow discharge may leak from your breasts called colostrum.  · You may have a bloody mucus discharge. This usually occurs a few days to a week before labor begins.  HOME CARE INSTRUCTIONS   · Keep your caregiver's appointments. Follow your caregiver's instructions regarding medication use, exercise, and diet.  · During pregnancy, you are providing food for you and your baby. Continue to eat regular, well-balanced meals. Choose foods such as meat, fish, milk and other low fat dairy products, vegetables, fruits, and whole-grain breads and cereals. Your caregiver will tell you of the ideal weight gain.  · A physical sexual relationship may be continued throughout pregnancy if there are no other problems such as early (premature) leaking of amniotic fluid from the membranes, vaginal bleeding, or belly (abdominal) pain.  · Exercise regularly if there are no restrictions. Check with your caregiver if you are unsure of the safety of your exercises. Greater weight gain will occur in the last 2 trimesters of pregnancy. Exercising helps:  · Control your weight.  · Get you in shape for labor and delivery.  · You lose weight after you deliver.  · Rest a lot with legs elevated, or as needed for leg cramps or low back pain.  · Wear a good support or jogging bra for breast tenderness during pregnancy. This may help if worn during  sleep. Pads or tissues may be used in the bra if you are leaking colostrum.  · Do not use hot tubs, steam rooms, or saunas.  · Wear your seat belt when driving. This protects you and your baby if you are in an accident.  · Avoid raw meat, cat litter boxes and soil used by cats. These carry germs that can cause birth defects in the baby.  · It is easier to loose urine during pregnancy. Tightening up and strengthening the pelvic muscles will help with this problem. You can practice stopping your urination while you are going to the bathroom. These are the same muscles you need to strengthen. It is also the muscles you would use if you were trying to stop from passing gas. You can practice tightening these muscles up 10 times a set and repeating this about 3 times per day. Once you know what muscles to tighten up, do not perform these   exercises during urination. It is more likely to cause an infection by backing up the urine.  · Ask for help if you have financial, counseling or nutritional needs during pregnancy. Your caregiver will be able to offer counseling for these needs as well as refer you for other special needs.  · Make a list of emergency phone numbers and have them available.  · Plan on getting help from family or friends when you go home from the hospital.  · Make a trial run to the hospital.  · Take prenatal classes with the father to understand, practice and ask questions about the labor and delivery.  · Prepare the baby's room/nursery.  · Do not travel out of the city unless it is absolutely necessary and with the advice of your caregiver.  · Wear only low or no heal shoes to have better balance and prevent falling.  MEDICATIONS AND DRUG USE IN PREGNANCY  · Take prenatal vitamins as directed. The vitamin should contain 1 milligram of folic acid. Keep all vitamins out of reach of children. Only a couple vitamins or tablets containing iron may be fatal to a baby or young child when ingested.  · Avoid use  of all medications, including herbs, over-the-counter medications, not prescribed or suggested by your caregiver. Only take over-the-counter or prescription medicines for pain, discomfort, or fever as directed by your caregiver. Do not use aspirin, ibuprofen (Motrin®, Advil®, Nuprin®) or naproxen (Aleve®) unless OK'd by your caregiver.  · Let your caregiver also know about herbs you may be using.  · Alcohol is related to a number of birth defects. This includes fetal alcohol syndrome. All alcohol, in any form, should be avoided completely. Smoking will cause low birth rate and premature babies.  · Street/illegal drugs are very harmful to the baby. They are absolutely forbidden. A baby born to an addicted mother will be addicted at birth. The baby will go through the same withdrawal an adult does.  SEEK MEDICAL CARE IF:  You have any concerns or worries during your pregnancy. It is better to call with your questions if you feel they cannot wait, rather than worry about them.  DECISIONS ABOUT CIRCUMCISION  You may or may not know the sex of your baby. If you know your baby is a boy, it may be time to think about circumcision. Circumcision is the removal of the foreskin of the penis. This is the skin that covers the sensitive end of the penis. There is no proven medical need for this. Often this decision is made on what is popular at the time or based upon religious beliefs and social issues. You can discuss these issues with your caregiver or pediatrician.  SEEK IMMEDIATE MEDICAL CARE IF:   · An unexplained oral temperature above 102° F (38.9° C) develops, or as your caregiver suggests.  · You have leaking of fluid from the vagina (birth canal). If leaking membranes are suspected, take your temperature and tell your caregiver of this when you call.  · There is vaginal spotting, bleeding or passing clots. Tell your caregiver of the amount and how many pads are used.  · You develop a bad smelling vaginal discharge with  a change in the color from clear to white.  · You develop vomiting that lasts more than 24 hours.  · You develop chills or fever.  · You develop shortness of breath.  · You develop burning on urination.  · You loose more than 2 pounds of weight   or gain more than 2 pounds of weight or as suggested by your caregiver.  · You notice sudden swelling of your face, hands, and feet or legs.  · You develop belly (abdominal) pain. Round ligament discomfort is a common non-cancerous (benign) cause of abdominal pain in pregnancy. Your caregiver still must evaluate you.  · You develop a severe headache that does not go away.  · You develop visual problems, blurred or double vision.  · If you have not felt your baby move for more than 1 hour. If you think the baby is not moving as much as usual, eat something with sugar in it and lie down on your left side for an hour. The baby should move at least 4 to 5 times per hour. Call right away if your baby moves less than that.  · You fall, are in a car accident or any kind of trauma.  · There is mental or physical violence at home.  Document Released: 11/15/2001 Document Revised: 02/13/2012 Document Reviewed: 05/20/2009  [employer]® Patient Information ©2013 [employer].

## 2013-01-31 NOTE — Progress Notes (Signed)
Pulse- 82  Edema-legs

## 2013-02-01 LAB — GLUCOSE TOLERANCE, 1 HOUR (50G) W/O FASTING: Glucose, 1 Hour GTT: 175 mg/dL — ABNORMAL HIGH (ref 70–140)

## 2013-02-06 ENCOUNTER — Telehealth: Payer: Self-pay | Admitting: General Practice

## 2013-02-06 NOTE — Telephone Encounter (Signed)
Patient called back, I informed her of results and appt time. Patient said she would be there. Reminded patient nothing to eat or drink after midnight and to plan to be here for 3 hours. Patient verbalized understanding & had no further questions

## 2013-02-06 NOTE — Telephone Encounter (Signed)
Called patient, no answer- left message to call us back at the clinics for appt information

## 2013-02-06 NOTE — Telephone Encounter (Signed)
Message copied by Kathee Delton on Wed Feb 06, 2013  1:55 PM ------      Message from: Odelia Gage A      Created: Tue Feb 05, 2013 10:28 AM      Regarding:  3 Hr appt        Appointment is for 02/12/13 @ 8:15                  ----- Message -----         From: Adam Phenix, MD         Sent: 02/04/2013   9:03 AM           To: [facility] Admin Pool            abnl 1 hr, needs 3 hr GTT       ------

## 2013-02-07 ENCOUNTER — Other Ambulatory Visit: Payer: Self-pay | Admitting: Obstetrics & Gynecology

## 2013-02-07 DIAGNOSIS — O09529 Supervision of elderly multigravida, unspecified trimester: Secondary | ICD-10-CM

## 2013-02-11 ENCOUNTER — Ambulatory Visit (HOSPITAL_COMMUNITY)
Admission: RE | Admit: 2013-02-11 | Discharge: 2013-02-11 | Disposition: A | Discharge status: Home / Self Care | Payer: Medicaid Other | Source: Ambulatory Visit | Attending: Obstetrics & Gynecology | Admitting: Obstetrics & Gynecology

## 2013-02-11 VITALS — BP 123/83 | HR 106 | Wt 292.0 lb

## 2013-02-11 DIAGNOSIS — O10019 Pre-existing essential hypertension complicating pregnancy, unspecified trimester: Secondary | ICD-10-CM | POA: Insufficient documentation

## 2013-02-11 DIAGNOSIS — O09299 Supervision of pregnancy with other poor reproductive or obstetric history, unspecified trimester: Secondary | ICD-10-CM | POA: Insufficient documentation

## 2013-02-11 DIAGNOSIS — E669 Obesity, unspecified: Secondary | ICD-10-CM | POA: Insufficient documentation

## 2013-02-11 DIAGNOSIS — Z8751 Personal history of pre-term labor: Secondary | ICD-10-CM | POA: Insufficient documentation

## 2013-02-11 DIAGNOSIS — I10 Essential (primary) hypertension: Secondary | ICD-10-CM

## 2013-02-11 DIAGNOSIS — O09529 Supervision of elderly multigravida, unspecified trimester: Secondary | ICD-10-CM

## 2013-02-11 NOTE — Progress Notes (Addendum)
[facility] ultrasound  37 yr old [phone] at [redacted]w[redacted]d with chronic hypertension, history of preeclampsia, and previous preterm delivery, for fetal growth ultrasound.  Findings: 1. Single intrauterine pregnancy. 2. Estimated fetal weight is in the 54th%. 3. Posterior placenta without evidence of previa. 4. Normal amniotic fluid volume. 5. The limited anatomy survey is normal.  Recommendations: 1. Advanced maternal age:  - previously counseled - patient declined all aneuploidy screening 2. Hypertension: - well controlled off of medication - recommend fetal growth every 4 weeks  - recommend antenatal testing starting at [redacted] weeks gestation - recommend delivery by estimated due date but not prior to 38 weeks in the absence of other complications 3. History of preeclampsia: - no consult requested - normal baseline labs - recommend close surveillance for the development of signs/symptoms of preeclampsia 4. Possible history of growth restricted baby: - patient is unsure - based on the records appears may have had 4lb14oz baby at 37 weeks - given history of preeclampsia and fetal growth restriction may consider low dose aspirin in this pregnancy to reduce the recurrence risks 5. Had elevated 1 hour glucola: is getting 3 hr gtt tomorrow  Eulis Foster, MD

## 2013-02-12 ENCOUNTER — Other Ambulatory Visit: Payer: Medicaid Other

## 2013-02-12 DIAGNOSIS — R7309 Other abnormal glucose: Secondary | ICD-10-CM

## 2013-02-13 LAB — GLUCOSE TOLERANCE, 3 HOURS
Glucose Tolerance, Fasting: 130 mg/dL — ABNORMAL HIGH (ref 70–104)
Glucose, GTT - 3 Hour: 183 mg/dL — ABNORMAL HIGH (ref 70–144)

## 2013-02-14 ENCOUNTER — Encounter: Payer: Self-pay | Admitting: Obstetrics & Gynecology

## 2013-02-14 ENCOUNTER — Encounter: Payer: Self-pay | Admitting: Advanced Practice Midwife

## 2013-02-14 ENCOUNTER — Ambulatory Visit (INDEPENDENT_AMBULATORY_CARE_PROVIDER_SITE_OTHER): Payer: Medicaid Other | Admitting: Advanced Practice Midwife

## 2013-02-14 VITALS — BP 138/83 | Temp 96.8°F | Wt 293.5 lb

## 2013-02-14 DIAGNOSIS — O24419 Gestational diabetes mellitus in pregnancy, unspecified control: Secondary | ICD-10-CM

## 2013-02-14 DIAGNOSIS — O10019 Pre-existing essential hypertension complicating pregnancy, unspecified trimester: Secondary | ICD-10-CM

## 2013-02-14 HISTORY — DX: Gestational diabetes mellitus in pregnancy, unspecified control: O24.419

## 2013-02-14 LAB — POCT URINALYSIS DIP (DEVICE)
Glucose, UA: NEGATIVE mg/dL
Ketones, ur: NEGATIVE mg/dL
Specific Gravity, Urine: 1.025 (ref 1.005–1.030)
Urobilinogen, UA: 0.2 mg/dL (ref 0.0–1.0)

## 2013-02-14 NOTE — Progress Notes (Signed)
Pulse- 85  Edema-ankles

## 2013-02-14 NOTE — Patient Instructions (Signed)
Gestational Diabetes Mellitus Gestational diabetes mellitus (GDM) is diabetes that occurs only during pregnancy. This happens when the body cannot properly handle the glucose (sugar) that increases in the blood after eating. During pregnancy, insulin resistance (reduced sensitivity to insulin) occurs because of the release of hormones from the placenta. Usually, the pancreas of pregnant women produces enough insulin to overcome the resistance that occurs. However, in gestational diabetes, the insulin is there but it does not work effectively. If the resistance is severe enough that the pancreas does not produce enough insulin, extra glucose builds up in the blood.  WHO IS AT RISK FOR DEVELOPING GESTATIONAL DIABETES?  Women with a history of diabetes in the family.  Women over age 69.  Women who are overweight. Women in certain ethnic groups (Hispanic, African AmerPregnancy - Third Trimester The third trimester begins at the 28th week of pregnancy and ends at birth. It is important to follow your doctor's instructions. HOME CARE   Go to your doctor's visits.  Do not smoke.  Do not drink alcohol or use drugs.  Only take medicine as told by your doctor.  Take prenatal vitamins as told. The vitamin should contain 1 milligram of folic acid.  Exercise.  Eat healthy foods. Eat regular, well-balanced meals.  You can have sex (intercourse) if there are no other problems with the pregnancy.  Do not use hot tubs, steam rooms, or saunas.  Wear a seat belt while driving.  Avoid raw meat, uncooked cheese, and litter boxes and soil used by cats.  Rest with your legs raised (elevated).  Make a list of emergency phone numbers. Keep this list with you.  Arrange for help when you come back home after delivering the baby.  Make a trial run to the hospital.  Take prenatal classes.  Prepare the baby's nursery.  Do not travel out of the city. If you absolutely have to, get permission from  your doctor first.  Wear flat shoes. Do not wear high heels. GET HELP RIGHT AWAY IF:   You have a temperature by mouth above 102 F (38.9 C), not controlled by medicine.  You have not felt the baby move for more than 1 hour. If you think the baby is not moving as much as normal, eat something with sugar in it or lie down on your left side for an hour. The baby should move at least 4 to 5 times per hour.  Fluid is coming from the vagina.  Blood is coming from the vagina. Light spotting is common, especially after sex (intercourse).  You have belly (abdominal) pain.  You have a bad smelling fluid (discharge) coming from the vagina. The fluid changes from clear to white.  You still feel sick to your stomach (nauseous).  You throw up (vomit) for more than 24 hours.  You have the chills.  You have shortness of breath.  You have a burning feeling when you pee (urinate).  You lose or gain more than 2 pounds (0.9 kilograms) of weight over a week, or as told by your doctor.  Your face, hands, feet, or legs get puffy (swell).  You have a bad headache that will not go away.  You start to have problems seeing (blurry or double vision).  You fall, are in a car accident, or have any kind of trauma.  There is mental or physical violence at home.  You have any concerns or worries during your pregnancy. MAKE SURE YOU:   Understand these instructions.  Will watch your condition.  Will get help right away if you are not doing well or get worse. Document Released: 02/15/2010 Document Revised: 02/13/2012 Document Reviewed: 02/15/2010 [facility] Patient Information 2013 [place], [place].  ican, Native American, [place] and [place] Islander). WHAT CAN HAPPEN TO THE BABY? If the mother's blood glucose is too high while she is pregnant, the extra sugar will travel through the umbilical cord to the baby. Some of the problems the baby may have are:  Large Baby - If the baby receives too much  sugar, the baby will gain more weight. This may cause the baby to be too large to be born normally (vaginally) and a Cesarean section (C-section) may be needed.  Low Blood Glucose (hypoglycemia)  The baby makes extra insulin, in response to the extra sugar its gets from its mother. When the baby is born and no longer needs this extra insulin, the baby's blood glucose level may drop.  Jaundice (yellow coloring of the skin and eyes)  This is fairly common in babies. It is caused from a build-up of the chemical called bilirubin. This is rarely serious, but is seen more often in babies whose mothers had gestational diabetes. RISKS TO THE MOTHER Women who have had gestational diabetes may be at higher risk for some problems, including:  Preeclampsia or toxemia, which includes problems with high blood pressure. Blood pressure and protein levels in the urine must be checked frequently.  Infections.  Cesarean section (C-section) for delivery.  Developing Type 2 diabetes later in life. About 30-50% will develop diabetes later, especially if obese. DIAGNOSIS  The hormones that cause insulin resistance are highest at about 24-28 weeks of pregnancy. If symptoms are experienced, they are much like symptoms you would normally expect during pregnancy.  GDM is often diagnosed using a two part method: 1. After 24-28 weeks of pregnancy, the woman drinks a glucose solution and takes a blood test. If the glucose level is high, a second test will be given. 2. Oral Glucose Tolerance Test (OGTT) which is 3 hours long  After not eating overnight, the blood glucose is checked. The woman drinks a glucose solution, and hourly blood glucose tests are taken. If the woman has risk factors for GDM, the caregiver may test earlier than 24 weeks of pregnancy. TREATMENT  Treatment of GDM is directed at keeping the mother's blood glucose level normal, and may include:  Meal planning.  Taking insulin or other medicine to  control your blood glucose level.  Exercise.  Keeping a daily record of the foods you eat.  Blood glucose monitoring and keeping a record of your blood glucose levels.  May monitor ketone levels in the urine, although this is no longer considered necessary in most pregnancies. HOME CARE INSTRUCTIONS  While you are pregnant:  Follow your caregiver's advice regarding your prenatal appointments, meal planning, exercise, medicines, vitamins, blood and other tests, and physical activities.  Keep a record of your meals, blood glucose tests, and the amount of insulin you are taking (if any). Show this to your caregiver at every prenatal visit.  If you have GDM, you may have problems with hypoglycemia (low blood glucose). You may suspect this if you become suddenly dizzy, feel shaky, and/or weak. If you think this is happening and you have a glucose meter, try to test your blood glucose level. Follow your caregiver's advice for when and how to treat your low blood glucose. Generally, the 15:15 rule is followed: Treat by consuming 15 grams  of carbohydrates, wait 15 minutes, and recheck blood glucose. Examples of 15 grams of carbohydrates are:  1 cup skim or low-fat milk.   cup juice.  3-4 glucose tablets.  5-6 hard candies.  1 small box raisins.   cup regular soda pop.  Practice good hygiene, to avoid infections.  Do not smoke. SEEK MEDICAL CARE IF:   You develop abnormal vaginal discharge, with or without itching.  You become weak and tired more than expected.  You seem to sweat a lot.  You have a sudden increase in weight, 5 pounds or more in one week.  You are losing weight, 3 pounds or more in a week.  Your blood glucose level is high, and you need instructions on what to do about it. SEEK IMMEDIATE MEDICAL CARE IF:   You develop a severe headache.  You faint or pass out.  You develop nausea and vomiting.  You become disoriented or confused.  You have a  convulsion.  You develop vision problems.  You develop stomach pain.  You develop vaginal bleeding.  You develop uterine contractions.  You have leaking or a gush of fluid from the vagina. AFTER YOU HAVE THE BABY:  Go to all of your follow-up appointments, and have blood tests as advised by your caregiver.  Maintain a healthy lifestyle, to prevent diabetes in the future. This includes:  Following a healthy meal plan.  Controlling your weight.  Getting enough exercise and proper rest.  Do not smoke.  Breastfeed your baby if you can. This will lower the chance of you and your baby developing diabetes later in life. For more information about diabetes, go to the American Diabetes Association at: PMFashions.com.cy. For more information about gestational diabetes, go to the [employer] at: RentRule.com.au. Document Released: 02/27/2001 Document Revised: 02/13/2012 Document Reviewed: 09/21/2009 [facility] Patient Information 2013 [place], [place].

## 2013-02-14 NOTE — Progress Notes (Signed)
Abnormal 3 hour GTT. GDM teaching Monday, prior GDM, diet controlled, will start diet now. Note given for light activity during community service. Has MFM f/u u/s on 4/7.

## 2013-02-16 ENCOUNTER — Encounter (HOSPITAL_COMMUNITY): Payer: Self-pay | Admitting: Obstetrics and Gynecology

## 2013-02-16 ENCOUNTER — Inpatient Hospital Stay (HOSPITAL_COMMUNITY)
Admission: AD | Admit: 2013-02-16 | Discharge: 2013-02-16 | Disposition: A | Discharge status: Home / Self Care | Payer: Medicaid Other | Source: Ambulatory Visit | Attending: Family Medicine | Admitting: Family Medicine

## 2013-02-16 DIAGNOSIS — O99810 Abnormal glucose complicating pregnancy: Secondary | ICD-10-CM | POA: Insufficient documentation

## 2013-02-16 DIAGNOSIS — O24419 Gestational diabetes mellitus in pregnancy, unspecified control: Secondary | ICD-10-CM

## 2013-02-16 DIAGNOSIS — O09893 Supervision of other high risk pregnancies, third trimester: Secondary | ICD-10-CM

## 2013-02-16 DIAGNOSIS — O368190 Decreased fetal movements, unspecified trimester, not applicable or unspecified: Secondary | ICD-10-CM | POA: Insufficient documentation

## 2013-02-16 DIAGNOSIS — Z3689 Encounter for other specified antenatal screening: Secondary | ICD-10-CM

## 2013-02-16 NOTE — MAU Note (Signed)
Darlene Bennett is [redacted]w[redacted]d; here with no fetal movement times 1 day. She felt her baby move last night but no movement today.

## 2013-02-16 NOTE — MAU Provider Note (Signed)
  History     CSN: [id number]  Arrival date and time: 02/16/13 0865   First Provider Initiated Contact with Patient 02/16/13 1850      Chief Complaint  Patient presents with  . Decreased Fetal Movement   HPI  Pt is a G4P2103 at 29.6 wks IUP here with report of decreased fetal movement today.  Current pt in [facility] clinic for history of IUGR, also newly diagnosed GDM, appt with Maggie on Monday.  Taking PO aspirin due to IUGR history.  No other complaints at this visit.  Last growth ultrasound: 02/11/13 54%.  Past Medical History  Diagnosis Date  . History of gestational diabetes in prior pregnancy, currently pregnant   . Gestational diabetes   . Gestational diabetes mellitus, antepartum 02/14/2013    Past Surgical History  Procedure Laterality Date  . Tubal ligation      2007    Family History  Problem Relation Age of Onset  . Heart disease Mother   . Hypertension Mother   . Diabetes Mother   . Hypertension Father   . Diabetes Father     History  Substance Use Topics  . Smoking status: Current Every Day Smoker    Types: Cigarettes  . Smokeless tobacco: Never Used  . Alcohol Use: No    Allergies: No Known Allergies  Prescriptions prior to admission  Medication Sig Dispense Refill  . aspirin 81 MG EC tablet Take 1 tablet (81 mg total) by mouth daily. Swallow whole.  30 tablet  12  . pantoprazole (PROTONIX) 40 MG tablet Take 1 tablet (40 mg total) by mouth daily.  30 tablet  3  . Prenatal Vit-Fe Fumarate-FA (PRENATAL MULTIVITAMIN) TABS Take 1 tablet by mouth daily.        Review of Systems  Constitutional: Negative.   HENT: Negative.   Eyes: Negative for blurred vision.  Gastrointestinal: Negative for abdominal pain.  Genitourinary:       No vaginal bleeding or leaking of fluid.    Neurological: Negative for headaches.   Physical Exam   Blood pressure 127/72, pulse 95, temperature 98 F (36.7 C), temperature source Oral, resp. rate 18, last menstrual period  07/22/2012.  Physical Exam  Constitutional: She is oriented to person, place, and time. She appears well-developed and well-nourished. No distress.  HENT:  Head: Normocephalic.  Neck: Normal range of motion. Neck supple.  Cardiovascular: Normal rate, regular rhythm and normal heart sounds.   Respiratory: Effort normal and breath sounds normal.  GI: Soft. There is no tenderness.  Genitourinary: No bleeding around the vagina.  Neurological: She is alert and oriented to person, place, and time.  Skin: Skin is warm and dry.  FHR 140's, +accels, +fetal movement heard on monitor and with palpation Toco - none   Consulted with Dr. Shawnie Pons > may discharge home, keep scheduled appt in high risk on Monday  MAU Course  Procedures  No results found for this or any previous visit (from the past 24 hour(s)).   Assessment and Plan  NST Reassuring  Plan: DC to home Keep appt on Monday  [facility] 02/16/2013, 7:06 PM

## 2013-02-17 NOTE — MAU Provider Note (Signed)
Chart reviewed and agree with management and plan.  

## 2013-02-18 ENCOUNTER — Encounter: Payer: Medicaid Other | Admitting: Obstetrics & Gynecology

## 2013-02-18 ENCOUNTER — Encounter: Payer: Medicaid Other | Attending: Obstetrics & Gynecology | Admitting: Dietician

## 2013-02-18 ENCOUNTER — Ambulatory Visit (INDEPENDENT_AMBULATORY_CARE_PROVIDER_SITE_OTHER): Payer: Medicaid Other | Admitting: Obstetrics & Gynecology

## 2013-02-18 DIAGNOSIS — Z713 Dietary counseling and surveillance: Secondary | ICD-10-CM | POA: Insufficient documentation

## 2013-02-18 DIAGNOSIS — O24919 Unspecified diabetes mellitus in pregnancy, unspecified trimester: Secondary | ICD-10-CM

## 2013-02-18 DIAGNOSIS — E119 Type 2 diabetes mellitus without complications: Secondary | ICD-10-CM

## 2013-02-18 DIAGNOSIS — O99810 Abnormal glucose complicating pregnancy: Secondary | ICD-10-CM | POA: Insufficient documentation

## 2013-02-18 MED ORDER — ACCU-CHEK FASTCLIX LANCETS MISC: 1.0000 | Freq: Four times a day (QID) | Status: DC

## 2013-02-18 MED ORDER — GLUCOSE BLOOD VI STRP: ORAL_STRIP | Status: DC

## 2013-02-18 NOTE — Progress Notes (Signed)
Diabetes Education:  Seen for DM education today.  This is her 4th child.  3 hr OGGT: Fasting:130, 1 hr: 294,  2 Hr: 207, 3 hr: 183.  Had history of GDM with her 37 year old.  Reviewed the diet and monitoring.  Provided handout Nutrition, Diabetes and Pregnancy and a Carb Counting Guide.  Provided an [employer] Lot: [id number] EXp: 04/03/2014.  Instructed to monitor fasting and 2 hr post-meal glucose levels.  On return demonstration, today, her post meal at 10:00 was 197 mg/dl.  Spoke with nursing and they will bring back to clinic in 1 week instead of 2 weeks.  MD to call in prescription for meter to the [facility].  Instructed to bring meter and glucose log to all clinic appointments.  Maggie Lucah Petta, RN, RD, CDE.

## 2013-02-25 ENCOUNTER — Encounter: Payer: Self-pay | Admitting: Obstetrics and Gynecology

## 2013-02-25 ENCOUNTER — Other Ambulatory Visit: Payer: Self-pay | Admitting: Obstetrics and Gynecology

## 2013-02-25 ENCOUNTER — Ambulatory Visit (INDEPENDENT_AMBULATORY_CARE_PROVIDER_SITE_OTHER): Payer: Medicaid Other | Admitting: Obstetrics and Gynecology

## 2013-02-25 VITALS — BP 134/90 | Temp 97.6°F | Wt 293.0 lb

## 2013-02-25 DIAGNOSIS — O09529 Supervision of elderly multigravida, unspecified trimester: Secondary | ICD-10-CM

## 2013-02-25 DIAGNOSIS — O10019 Pre-existing essential hypertension complicating pregnancy, unspecified trimester: Secondary | ICD-10-CM

## 2013-02-25 DIAGNOSIS — G5601 Carpal tunnel syndrome, right upper limb: Secondary | ICD-10-CM

## 2013-02-25 DIAGNOSIS — O10013 Pre-existing essential hypertension complicating pregnancy, third trimester: Secondary | ICD-10-CM

## 2013-02-25 DIAGNOSIS — O09523 Supervision of elderly multigravida, third trimester: Secondary | ICD-10-CM

## 2013-02-25 DIAGNOSIS — O09299 Supervision of pregnancy with other poor reproductive or obstetric history, unspecified trimester: Secondary | ICD-10-CM

## 2013-02-25 DIAGNOSIS — G5600 Carpal tunnel syndrome, unspecified upper limb: Secondary | ICD-10-CM

## 2013-02-25 DIAGNOSIS — O0993 Supervision of high risk pregnancy, unspecified, third trimester: Secondary | ICD-10-CM

## 2013-02-25 DIAGNOSIS — O99810 Abnormal glucose complicating pregnancy: Secondary | ICD-10-CM

## 2013-02-25 DIAGNOSIS — O24419 Gestational diabetes mellitus in pregnancy, unspecified control: Secondary | ICD-10-CM

## 2013-02-25 DIAGNOSIS — O09293 Supervision of pregnancy with other poor reproductive or obstetric history, third trimester: Secondary | ICD-10-CM

## 2013-02-25 DIAGNOSIS — I10 Essential (primary) hypertension: Secondary | ICD-10-CM

## 2013-02-25 LAB — POCT URINALYSIS DIP (DEVICE)
Bilirubin Urine: NEGATIVE
Glucose, UA: NEGATIVE mg/dL
Hgb urine dipstick: NEGATIVE
Specific Gravity, Urine: 1.025 (ref 1.005–1.030)
pH: 7 (ref 5.0–8.0)

## 2013-02-25 MED ORDER — GLYBURIDE 2.5 MG PO TABS: 2.5000 mg | ORAL_TABLET | Freq: Two times a day (BID) | ORAL | Status: DC

## 2013-02-25 NOTE — Progress Notes (Signed)
Diabetes Education:  Seen today for review of testing schedule and dietary intervention.  Has obtained her strips and lancets but has only checked her glucose and recorded it for 8 times since her visit last Monday.    Fasting levels : 132, 133, 146.  2hr pc BK: 147, 167,   No PC lunch readings, and 2 hr PC dinner: 142, 175, 126.  Reviewed need for testing, the times to test and use of her meter and lancing device.  Need to f/u at next visit.  To stat a pill in the AM and at [facility] she tells me.  Darlene Kenae Lindquist, RN, RD, CDE.

## 2013-02-25 NOTE — Patient Instructions (Signed)
Contraception Choices Birth control (contraception) can stop pregnancy from happening. Different types of birth control work in different ways. Some can:  Make the mucus in the cervix thick. This makes it hard for sperm to get into the uterus.  Thin the lining of the uterus. This makes it hard for an egg to attach to the wall of the uterus.  Stop the ovaries from releasing an egg.  Block the sperm from reaching the egg. Certain types of surgery can stop pregnancy from happening. For women, the sugery closes the fallopian tubes (tubal ligation). For men, the surgery stops sperm from releasing during sex (vasectomy). HORMONAL BIRTH CONTROL Hormonal birth control stops pregnancy by putting hormones into your body. Types of birth control include:  A small tube put under the skin of the upper arm (implant). The tube can stay in place for 3 years.  Shots given every 3 months.  Pills taken every day or once after sex (intercourse).  Patches that are changed once a week.  A ring put into the vagina (vaginal ring). The ring is left in place for 3 weeks and removed for 1 week. Then, a new ring is put in the vagina. BARRIER BIRTH CONTROL  Barrier birth control blocks sperm from reaching the egg. Types of birth control include:   A thin covering worn on the penis (female condom) during sex.  A soft, loose covering put into the vagina (female condom) before sex.  A rubber bowl that sits over the cervix (diaphragm). The bowl must be made for you. The bowl is put into the vagina before sex. The bowl is left in place for 6 to 8 hours after sex.  A small, soft cup that fits over the cervix (cervical cap). The cup must be made for you. The cup can be left in place for 48 hours after sex.  A sponge that is put into the vagina before sex.  A chemical that kills or blocks sperm from getting into the cervix and uterus (spermicide). The chemical may be a cream, jelly, foam, or pill. INTRAUTERINE (IUD)  BIRTH CONTROL  IUD birth control is a small, T-shaped piece of plastic. The plastic is put inside the uterus. There are 2 types of IUD:  Copper IUD. The IUD is covered in copper wire. The copper makes a fluid that kills sperm. It can stay in place for 10 years.  Hormone IUD. The hormone stops pregnancy from happening. It can stay in place for 5 years. NATURAL FAMILY PLANNING BIRTH CONTROL  Natural family planning means not having sex or using barrier birth control when the woman is fertile. A woman can:  Use a calendar to keep track of when she is fertile.  Use a thermometer to measure her body temperature. Protect yourself against sexual diseases no matter what type of birth control you use. Talk to your doctor about which type of birth control is best for you. Document Released: 09/18/2009 Document Revised: 02/13/2012 Document Reviewed: 03/30/2011 [employer] Patient Information 2013 [place], [place].

## 2013-02-25 NOTE — Progress Notes (Signed)
Patient doing well without complaints. cbg f 123-146 2hr pp 147-197 2hr p [id number]. Patient admits to not always adhering to dietWill start glyburide 2.5mg  BID. Patient to meet Maggie today. Will start twice weekly NST next visit

## 2013-02-26 ENCOUNTER — Encounter: Payer: Medicaid Other | Admitting: Dietician

## 2013-03-04 ENCOUNTER — Ambulatory Visit (INDEPENDENT_AMBULATORY_CARE_PROVIDER_SITE_OTHER): Payer: Medicaid Other | Admitting: Family Medicine

## 2013-03-04 ENCOUNTER — Other Ambulatory Visit: Payer: Self-pay | Admitting: Obstetrics and Gynecology

## 2013-03-04 ENCOUNTER — Encounter: Payer: Medicaid Other | Admitting: Obstetrics and Gynecology

## 2013-03-04 VITALS — BP 132/86 | Temp 96.8°F | Wt 285.6 lb

## 2013-03-04 DIAGNOSIS — O99810 Abnormal glucose complicating pregnancy: Secondary | ICD-10-CM

## 2013-03-04 DIAGNOSIS — O24419 Gestational diabetes mellitus in pregnancy, unspecified control: Secondary | ICD-10-CM

## 2013-03-04 DIAGNOSIS — O10019 Pre-existing essential hypertension complicating pregnancy, unspecified trimester: Secondary | ICD-10-CM

## 2013-03-04 DIAGNOSIS — O10013 Pre-existing essential hypertension complicating pregnancy, third trimester: Secondary | ICD-10-CM

## 2013-03-04 LAB — POCT URINALYSIS DIP (DEVICE)
Glucose, UA: NEGATIVE mg/dL
Ketones, ur: NEGATIVE mg/dL
Nitrite: NEGATIVE
Protein, ur: 100 mg/dL — AB
Specific Gravity, Urine: 1.025 (ref 1.005–1.030)
Urobilinogen, UA: 1 mg/dL (ref 0.0–1.0)
pH: 7 (ref 5.0–8.0)

## 2013-03-04 MED ORDER — INSULIN REGULAR HUMAN 100 UNIT/ML IJ SOLN: 20.0000 [IU] | Freq: Two times a day (BID) | INTRAMUSCULAR | Status: DC

## 2013-03-04 MED ORDER — INSULIN NPH (HUMAN) (ISOPHANE) 100 UNIT/ML ~~LOC~~ SUSP: 20.0000 [IU] | Freq: Two times a day (BID) | SUBCUTANEOUS | Status: DC

## 2013-03-04 MED ORDER — "INSULIN SYRINGE 31G X 5/16"" 1 ML MISC": 1.0000 [IU] | Freq: Three times a day (TID) | Status: DC

## 2013-03-04 NOTE — Patient Instructions (Addendum)
Insulin NPH -cloudy 50 u in the morning and 20 units at bedtime. Insulin Regular-clear 30 u in the morning and 20 units prior to dinner. Check BS, take insulin and eat in morning. Always eat within 30 mins of taking insulin. Gestational Diabetes Mellitus Gestational diabetes mellitus (GDM) is diabetes that occurs only during pregnancy. This happens when the body cannot properly handle the glucose (sugar) that increases in the blood after eating. During pregnancy, insulin resistance (reduced sensitivity to insulin) occurs because of the release of hormones from the placenta. Usually, the pancreas of pregnant women produces enough insulin to overcome the resistance that occurs. However, in gestational diabetes, the insulin is there but it does not work effectively. If the resistance is severe enough that the pancreas does not produce enough insulin, extra glucose builds up in the blood.  WHO IS AT RISK FOR DEVELOPING GESTATIONAL DIABETES?  Women with a history of diabetes in the family.  Women over age 15.  Women who are overweight.  Women in certain ethnic groups (Hispanic, African American, Native American, [place] and [place] Islander). WHAT CAN HAPPEN TO THE BABY? If the mother's blood glucose is too high while she is pregnant, the extra sugar will travel through the umbilical cord to the baby. Some of the problems the baby may have are:  Large Baby - If the baby receives too much sugar, the baby will gain more weight. This may cause the baby to be too large to be born normally (vaginally) and a Cesarean section (C-section) may be needed.  Low Blood Glucose (hypoglycemia)  The baby makes extra insulin, in response to the extra sugar its gets from its mother. When the baby is born and no longer needs this extra insulin, the baby's blood glucose level may drop.  Jaundice (yellow coloring of the skin and eyes)  This is fairly common in babies. It is caused from a build-up of the chemical called  bilirubin. This is rarely serious, but is seen more often in babies whose mothers had gestational diabetes. RISKS TO THE MOTHER Women who have had gestational diabetes may be at higher risk for some problems, including:  Preeclampsia or toxemia, which includes problems with high blood pressure. Blood pressure and protein levels in the urine must be checked frequently.  Infections.  Cesarean section (C-section) for delivery.  Developing Type 2 diabetes later in life. About 30-50% will develop diabetes later, especially if obese. DIAGNOSIS  The hormones that cause insulin resistance are highest at about 24-28 weeks of pregnancy. If symptoms are experienced, they are much like symptoms you would normally expect during pregnancy.  GDM is often diagnosed using a two part method: 1. After 24-28 weeks of pregnancy, the woman drinks a glucose solution and takes a blood test. If the glucose level is high, a second test will be given. 2. Oral Glucose Tolerance Test (OGTT) which is 3 hours long  After not eating overnight, the blood glucose is checked. The woman drinks a glucose solution, and hourly blood glucose tests are taken. If the woman has risk factors for GDM, the caregiver may test earlier than 24 weeks of pregnancy. TREATMENT  Treatment of GDM is directed at keeping the mother's blood glucose level normal, and may include:  Meal planning.  Taking insulin or other medicine to control your blood glucose level.  Exercise.  Keeping a daily record of the foods you eat.  Blood glucose monitoring and keeping a record of your blood glucose levels.  May monitor ketone  levels in the urine, although this is no longer considered necessary in most pregnancies. HOME CARE INSTRUCTIONS  While you are pregnant:  Follow your caregiver's advice regarding your prenatal appointments, meal planning, exercise, medicines, vitamins, blood and other tests, and physical activities.  Keep a record of your  meals, blood glucose tests, and the amount of insulin you are taking (if any). Show this to your caregiver at every prenatal visit.  If you have GDM, you may have problems with hypoglycemia (low blood glucose). You may suspect this if you become suddenly dizzy, feel shaky, and/or weak. If you think this is happening and you have a glucose meter, try to test your blood glucose level. Follow your caregiver's advice for when and how to treat your low blood glucose. Generally, the 15:15 rule is followed: Treat by consuming 15 grams of carbohydrates, wait 15 minutes, and recheck blood glucose. Examples of 15 grams of carbohydrates are:  1 cup skim or low-fat milk.   cup juice.  3-4 glucose tablets.  5-6 hard candies.  1 small box raisins.   cup regular soda pop.  Practice good hygiene, to avoid infections.  Do not smoke. SEEK MEDICAL CARE IF:   You develop abnormal vaginal discharge, with or without itching.  You become weak and tired more than expected.  You seem to sweat a lot.  You have a sudden increase in weight, 5 pounds or more in one week.  You are losing weight, 3 pounds or more in a week.  Your blood glucose level is high, and you need instructions on what to do about it. SEEK IMMEDIATE MEDICAL CARE IF:   You develop a severe headache.  You faint or pass out.  You develop nausea and vomiting.  You become disoriented or confused.  You have a convulsion.  You develop vision problems.  You develop stomach pain.  You develop vaginal bleeding.  You develop uterine contractions.  You have leaking or a gush of fluid from the vagina. AFTER YOU HAVE THE BABY:  Go to all of your follow-up appointments, and have blood tests as advised by your caregiver.  Maintain a healthy lifestyle, to prevent diabetes in the future. This includes:  Following a healthy meal plan.  Controlling your weight.  Getting enough exercise and proper rest.  Do not  smoke.  Breastfeed your baby if you can. This will lower the chance of you and your baby developing diabetes later in life. For more information about diabetes, go to the American Diabetes Association at: PMFashions.com.cy. For more information about gestational diabetes, go to the [employer] at: RentRule.com.au. Document Released: 02/27/2001 Document Revised: 02/13/2012 Document Reviewed: 09/21/2009 [facility] Patient Information 2013 [place], [place].  Breastfeeding Deciding to breastfeed is one of the best choices you can make for you and your baby. The information that follows gives a brief overview of the benefits of breastfeeding as well as common topics surrounding breastfeeding. BENEFITS OF BREASTFEEDING For the baby  The first milk (colostrum) helps the baby's digestive system function better.   There are antibodies in the mother's milk that help the baby fight off infections.   The baby has a lower incidence of asthma, allergies, and sudden infant death syndrome (SIDS).   The nutrients in breast milk are better for the baby than infant formulas, and breast milk helps the baby's brain grow better.   Babies who breastfeed have less gas, colic, and constipation.  For the mother  Breastfeeding helps develop  a very special bond between the mother and her baby.   Breastfeeding is convenient, always available at the correct temperature, and costs nothing.   Breastfeeding burns calories in the mother and helps her lose weight that was gained during pregnancy.   Breastfeeding makes the uterus contract back down to normal size faster and slows bleeding following delivery.   Breastfeeding mothers have a lower risk of developing breast cancer.  BREASTFEEDING FREQUENCY  A healthy, full-term baby may breastfeed as often as every hour or space his or her feedings to every 3 hours.   Watch your baby for signs of hunger.  Nurse your baby if he or she shows signs of hunger. How often you nurse will vary from baby to baby.   Nurse as often as the baby requests, or when you feel the need to reduce the fullness of your breasts.   Awaken the baby if it has been 3 4 hours since the last feeding.   Frequent feeding will help the mother make more milk and will help prevent problems, such as sore nipples and engorgement of the breasts.  BABY'S POSITION AT THE BREAST  Whether lying down or sitting, be sure that the baby's tummy is facing your tummy.   Support the breast with 4 fingers underneath the breast and the thumb above. Make sure your fingers are well away from the nipple and baby's mouth.   Stroke the baby's lips gently with your finger or nipple.   When the baby's mouth is open wide enough, place all of your nipple and as much of the areola as possible into your baby's mouth.   Pull the baby in close so the tip of the nose and the baby's cheeks touch the breast during the feeding.  FEEDINGS AND SUCTION  The length of each feeding varies from baby to baby and from feeding to feeding.   The baby must suck about 2 3 minutes for your milk to get to him or her. This is called a "let down." For this reason, allow the baby to feed on each breast as long as he or she wants. Your baby will end the feeding when he or she has received the right balance of nutrients.   To break the suction, put your finger into the corner of the baby's mouth and slide it between his or her gums before removing your breast from his or her mouth. This will help prevent sore nipples.  HOW TO TELL WHETHER YOUR BABY IS GETTING ENOUGH BREAST MILK. Wondering whether or not your baby is getting enough milk is a common concern among mothers. You can be assured that your baby is getting enough milk if:   Your baby is actively sucking and you hear swallowing.   Your baby seems relaxed and satisfied after a feeding.   Your baby  nurses at least 8 12 times in a 24 hour time period. Nurse your baby until he or she unlatches or falls asleep at the first breast (at least 10 20 minutes), then offer the second side.   Your baby is wetting 5 6 disposable diapers (6 8 cloth diapers) in a 24 hour period by 8 60 days of age.   Your baby is having at least 3 4 stools every 24 hours for the first 6 weeks. The stool should be soft and yellow.   Your baby should gain 4 7 ounces per week after he or she is 42 days old.   Your breasts  feel softer after nursing.  REDUCING BREAST ENGORGEMENT  In the first week after your baby is born, you may experience signs of breast engorgement. When breasts are engorged, they feel heavy, warm, full, and may be tender to the touch. You can reduce engorgement if you:   Nurse frequently, every 2 3 hours. Mothers who breastfeed early and often have fewer problems with engorgement.   Place light ice packs on your breasts for 10 20 minutes between feedings. This reduces swelling. Wrap the ice packs in a lightweight towel to protect your skin. Bags of frozen vegetables work well for this purpose.   Take a warm shower or apply warm, moist heat to your breast for 5 10 minutes just before each feeding. This increases circulation and helps the milk flow.   Gently massage your breast before and during the feeding. Using your finger tips, massage from the chest wall towards your nipple in a circular motion.   Make sure that the baby empties at least one breast at every feeding before switching sides.   Use a breast pump to empty the breasts if your baby is sleepy or not nursing well. You may also want to pump if you are returning to work oryou feel you are getting engorged.   Avoid bottle feeds, pacifiers, or supplemental feedings of water or juice in place of breastfeeding. Breast milk is all the food your baby needs. It is not necessary for your baby to have water or formula. In fact, to help  your breasts make more milk, it is best not to give your baby supplemental feedings during the early weeks.   Be sure the baby is latched on and positioned properly while breastfeeding.   Wear a supportive bra, avoiding underwire styles.   Eat a balanced diet with enough fluids.   Rest often, relax, and take your prenatal vitamins to prevent fatigue, stress, and anemia.  If you follow these suggestions, your engorgement should improve in 24 48 hours. If you are still experiencing difficulty, call your lactation consultant or caregiver.  CARING FOR YOURSELF Take care of your breasts  Bathe or shower daily.   Avoid using soap on your nipples.   Start feedings on your left breast at one feeding and on your right breast at the next feeding.   You will notice an increase in your milk supply 2 5 days after delivery. You may feel some discomfort from engorgement, which makes your breasts very firm and often tender. Engorgement "peaks" out within 24 48 hours. In the meantime, apply warm moist towels to your breasts for 5 10 minutes before feeding. Gentle massage and expression of some milk before feeding will soften your breasts, making it easier for your baby to latch on.   Wear a well-fitting nursing bra, and air dry your nipples for a 3 after each feeding.   Only use cotton bra pads.   Only use pure lanolin on your nipples after nursing. You do not need to wash it off before feeding the baby again. Another option is to express a few drops of breast milk and gently massage it into your nipples.  Take care of yourself  Eat well-balanced meals and nutritious snacks.   Drinking milk, fruit juice, and water to satisfy your thirst (about 8 glasses a day).   Get plenty of rest.  Avoid foods that you notice affect the baby in a bad way.  SEEK MEDICAL CARE IF:   You have difficulty with breastfeeding  and need help.   You have a hard, red, sore area on your breast  that is accompanied by a fever.   Your baby is too sleepy to eat well or is having trouble sleeping.   Your baby is wetting less than 6 diapers a day, by 75 days of age.   Your baby's skin or white part of his or her eyes is more yellow than it was in the hospital.   You feel depressed.  Document Released: 11/21/2005 Document Revised: 05/22/2012 Document Reviewed: 02/19/2012 [facility] Patient Information 2013 [place], [place].

## 2013-03-04 NOTE — Progress Notes (Signed)
No book--reports BS are still too high--fasting was 140.  Medication is not working.  265 the other evening.  Will start insulin. NST reviewed and reactive. BP stable--will check urine culture.

## 2013-03-04 NOTE — Progress Notes (Signed)
Diabetes Education:  Seen today for insulin instruction.  Does not have glucose log or meter with her today.  Relates that "the numbers are always high.  This morning the fasting was 140 mg".  Reviewed the procedure for mixing insulins.  She is on NPH 50 units and Regular 30 units AC breakfast, 20 units of Regular AC dinner and 20 units NPH at HS.  She completed a return demonstration drawing up and mixing the insulins and giving herself an injection of sterile NaCl into her RT thigh.  Instructed to obtain insulin and syringes at pharmacy.  She can keep the insulin bottles that she is currently using in their boxes at room temperature and store the 2 other vials in the refrigeration.  Instructed to bring meter and glucose log to next appointment.  Reviewed the procedure for the treatment of low blood glucose.  Maggie Pearce Littlefield, RN, RD, CDE

## 2013-03-04 NOTE — Progress Notes (Signed)
Pulse: 104 Pt states that glyburide is not working blood sugars are still high.

## 2013-03-05 ENCOUNTER — Encounter: Payer: Medicaid Other | Attending: Obstetrics and Gynecology | Admitting: Dietician

## 2013-03-05 DIAGNOSIS — Z713 Dietary counseling and surveillance: Secondary | ICD-10-CM | POA: Insufficient documentation

## 2013-03-05 DIAGNOSIS — O99810 Abnormal glucose complicating pregnancy: Secondary | ICD-10-CM | POA: Insufficient documentation

## 2013-03-07 ENCOUNTER — Telehealth: Payer: Self-pay | Admitting: *Deleted

## 2013-03-07 LAB — CULTURE, OB URINE

## 2013-03-07 NOTE — Telephone Encounter (Addendum)
Message copied by Jill Side on Thu Mar 07, 2013  9:36 AM ------      Message from: Reva Bores      Created: Thu Mar 07, 2013  9:10 AM       Needs keflex 500 mg qid x 7 days ------ Called pt @ home tel #- not a valid #. Then called significant other tel # [phone] and left message to have Darlene Bennett call us and leave message of when she can be reached or if we can leave detailed information on the voice mail.  4/4  0930  Pt informed of UTI and Rx needed while at scheduled clinic visit today. Pt voiced understanding.

## 2013-03-08 ENCOUNTER — Ambulatory Visit (INDEPENDENT_AMBULATORY_CARE_PROVIDER_SITE_OTHER): Payer: Medicaid Other | Admitting: *Deleted

## 2013-03-08 VITALS — BP 137/83 | Wt 285.9 lb

## 2013-03-08 DIAGNOSIS — O99810 Abnormal glucose complicating pregnancy: Secondary | ICD-10-CM

## 2013-03-08 MED ORDER — CEPHALEXIN 500 MG PO CAPS: 500.0000 mg | ORAL_CAPSULE | Freq: Four times a day (QID) | ORAL | Status: AC

## 2013-03-08 NOTE — Progress Notes (Signed)
P - 97 

## 2013-03-09 NOTE — Progress Notes (Signed)
4/4 NST reviewed and reactive 

## 2013-03-11 ENCOUNTER — Encounter (HOSPITAL_COMMUNITY): Payer: Self-pay

## 2013-03-11 ENCOUNTER — Other Ambulatory Visit: Payer: Self-pay | Admitting: Family Medicine

## 2013-03-11 ENCOUNTER — Ambulatory Visit (HOSPITAL_COMMUNITY)
Admission: RE | Admit: 2013-03-11 | Discharge: 2013-03-11 | Disposition: A | Discharge status: Home / Self Care | Payer: Medicaid Other | Source: Ambulatory Visit | Attending: Obstetrics and Gynecology | Admitting: Obstetrics and Gynecology

## 2013-03-11 ENCOUNTER — Ambulatory Visit (INDEPENDENT_AMBULATORY_CARE_PROVIDER_SITE_OTHER): Payer: Medicaid Other | Admitting: Family Medicine

## 2013-03-11 VITALS — BP 129/81 | HR 82 | Wt 296.0 lb

## 2013-03-11 VITALS — BP 139/78 | Wt 296.0 lb

## 2013-03-11 DIAGNOSIS — O24419 Gestational diabetes mellitus in pregnancy, unspecified control: Secondary | ICD-10-CM

## 2013-03-11 DIAGNOSIS — O09299 Supervision of pregnancy with other poor reproductive or obstetric history, unspecified trimester: Secondary | ICD-10-CM | POA: Insufficient documentation

## 2013-03-11 DIAGNOSIS — O09523 Supervision of elderly multigravida, third trimester: Secondary | ICD-10-CM

## 2013-03-11 DIAGNOSIS — O99810 Abnormal glucose complicating pregnancy: Secondary | ICD-10-CM | POA: Insufficient documentation

## 2013-03-11 DIAGNOSIS — O09529 Supervision of elderly multigravida, unspecified trimester: Secondary | ICD-10-CM

## 2013-03-11 DIAGNOSIS — I10 Essential (primary) hypertension: Secondary | ICD-10-CM

## 2013-03-11 DIAGNOSIS — Z8751 Personal history of pre-term labor: Secondary | ICD-10-CM | POA: Insufficient documentation

## 2013-03-11 DIAGNOSIS — O10019 Pre-existing essential hypertension complicating pregnancy, unspecified trimester: Secondary | ICD-10-CM | POA: Insufficient documentation

## 2013-03-11 DIAGNOSIS — O1002 Pre-existing essential hypertension complicating childbirth: Secondary | ICD-10-CM

## 2013-03-11 DIAGNOSIS — O10013 Pre-existing essential hypertension complicating pregnancy, third trimester: Secondary | ICD-10-CM

## 2013-03-11 DIAGNOSIS — E669 Obesity, unspecified: Secondary | ICD-10-CM | POA: Insufficient documentation

## 2013-03-11 LAB — POCT URINALYSIS DIP (DEVICE)
Bilirubin Urine: NEGATIVE
Glucose, UA: NEGATIVE mg/dL
Hgb urine dipstick: NEGATIVE
Ketones, ur: NEGATIVE mg/dL
Specific Gravity, Urine: 1.025 (ref 1.005–1.030)
Urobilinogen, UA: 0.2 mg/dL (ref 0.0–1.0)

## 2013-03-11 MED ORDER — INSULIN NPH (HUMAN) (ISOPHANE) 100 UNIT/ML ~~LOC~~ SUSP: 22.0000 [IU] | Freq: Two times a day (BID) | SUBCUTANEOUS | Status: DC

## 2013-03-11 NOTE — Progress Notes (Signed)
FBS 85-108 2 hour pp 95-155 Increase NPH by 2 units in am to 52 and 2 units at hs to 22 NST reviewed and reactive.

## 2013-03-11 NOTE — Progress Notes (Signed)
Pulse: 97

## 2013-03-11 NOTE — Patient Instructions (Signed)
Gestational Diabetes Mellitus Gestational diabetes mellitus (GDM) is diabetes that occurs only during pregnancy. This happens when the body cannot properly handle the glucose (sugar) that increases in the blood after eating. During pregnancy, insulin resistance (reduced sensitivity to insulin) occurs because of the release of hormones from the placenta. Usually, the pancreas of pregnant women produces enough insulin to overcome the resistance that occurs. However, in gestational diabetes, the insulin is there but it does not work effectively. If the resistance is severe enough that the pancreas does not produce enough insulin, extra glucose builds up in the blood.  WHO IS AT RISK FOR DEVELOPING GESTATIONAL DIABETES?  Women with a history of diabetes in the family.  Women over age 25.  Women who are overweight.  Women in certain ethnic groups (Hispanic, African American, Native American, Asian and Pacific Islander). WHAT CAN HAPPEN TO THE BABY? If the mother's blood glucose is too high while she is pregnant, the extra sugar will travel through the umbilical cord to the baby. Some of the problems the baby may have are:  Large Baby - If the baby receives too much sugar, the baby will gain more weight. This may cause the baby to be too large to be born normally (vaginally) and a Cesarean section (C-section) may be needed.  Low Blood Glucose (hypoglycemia)  The baby makes extra insulin, in response to the extra sugar its gets from its mother. When the baby is born and no longer needs this extra insulin, the baby's blood glucose level may drop.  Jaundice (yellow coloring of the skin and eyes)  This is fairly common in babies. It is caused from a build-up of the chemical called bilirubin. This is rarely serious, but is seen more often in babies whose mothers had gestational diabetes. RISKS TO THE MOTHER Women who have had gestational diabetes may be at higher risk for some problems,  including:  Preeclampsia or toxemia, which includes problems with high blood pressure. Blood pressure and protein levels in the urine must be checked frequently.  Infections.  Cesarean section (C-section) for delivery.  Developing Type 2 diabetes later in life. About 30-50% will develop diabetes later, especially if obese. DIAGNOSIS  The hormones that cause insulin resistance are highest at about 24-28 weeks of pregnancy. If symptoms are experienced, they are much like symptoms you would normally expect during pregnancy.  GDM is often diagnosed using a two part method: 1. After 24-28 weeks of pregnancy, the woman drinks a glucose solution and takes a blood test. If the glucose level is high, a second test will be given. 2. Oral Glucose Tolerance Test (OGTT) which is 3 hours long  After not eating overnight, the blood glucose is checked. The woman drinks a glucose solution, and hourly blood glucose tests are taken. If the woman has risk factors for GDM, the caregiver may test earlier than 24 weeks of pregnancy. TREATMENT  Treatment of GDM is directed at keeping the mother's blood glucose level normal, and may include:  Meal planning.  Taking insulin or other medicine to control your blood glucose level.  Exercise.  Keeping a daily record of the foods you eat.  Blood glucose monitoring and keeping a record of your blood glucose levels.  May monitor ketone levels in the urine, although this is no longer considered necessary in most pregnancies. HOME CARE INSTRUCTIONS  While you are pregnant:  Follow your caregiver's advice regarding your prenatal appointments, meal planning, exercise, medicines, vitamins, blood and other tests, and physical   activities.  Keep a record of your meals, blood glucose tests, and the amount of insulin you are taking (if any). Show this to your caregiver at every prenatal visit.  If you have GDM, you may have problems with hypoglycemia (low blood glucose).  You may suspect this if you become suddenly dizzy, feel shaky, and/or weak. If you think this is happening and you have a glucose meter, try to test your blood glucose level. Follow your caregiver's advice for when and how to treat your low blood glucose. Generally, the 15:15 rule is followed: Treat by consuming 15 grams of carbohydrates, wait 15 minutes, and recheck blood glucose. Examples of 15 grams of carbohydrates are:  1 cup skim or low-fat milk.   cup juice.  3-4 glucose tablets.  5-6 hard candies.  1 small box raisins.   cup regular soda pop.  Practice good hygiene, to avoid infections.  Do not smoke. SEEK MEDICAL CARE IF:   You develop abnormal vaginal discharge, with or without itching.  You become weak and tired more than expected.  You seem to sweat a lot.  You have a sudden increase in weight, 5 pounds or more in one week.  You are losing weight, 3 pounds or more in a week.  Your blood glucose level is high, and you need instructions on what to do about it. SEEK IMMEDIATE MEDICAL CARE IF:   You develop a severe headache.  You faint or pass out.  You develop nausea and vomiting.  You become disoriented or confused.  You have a convulsion.  You develop vision problems.  You develop stomach pain.  You develop vaginal bleeding.  You develop uterine contractions.  You have leaking or a gush of fluid from the vagina. AFTER YOU HAVE THE BABY:  Go to all of your follow-up appointments, and have blood tests as advised by your caregiver.  Maintain a healthy lifestyle, to prevent diabetes in the future. This includes:  Following a healthy meal plan.  Controlling your weight.  Getting enough exercise and proper rest.  Do not smoke.  Breastfeed your baby if you can. This will lower the chance of you and your baby developing diabetes later in life. For more information about diabetes, go to the American Diabetes Association at:  www.americandiabetesassociation.org. For more information about gestational diabetes, go to the American Congress of Obstetricians and Gynecologists at: www.acog.org. Document Released: 02/27/2001 Document Revised: 02/13/2012 Document Reviewed: 09/21/2009 [employer] Patient Information 2013 [employer].  Breastfeeding Deciding to breastfeed is one of the best choices you can make for you and your baby. The information that follows gives a brief overview of the benefits of breastfeeding as well as common topics surrounding breastfeeding. BENEFITS OF BREASTFEEDING For the baby  The first milk (colostrum) helps the baby's digestive system function better.   There are antibodies in the mother's milk that help the baby fight off infections.   The baby has a lower incidence of asthma, allergies, and sudden infant death syndrome (SIDS).   The nutrients in breast milk are better for the baby than infant formulas, and breast milk helps the baby's brain grow better.   Babies who breastfeed have less gas, colic, and constipation.  For the mother  Breastfeeding helps develop a very special bond between the mother and her baby.   Breastfeeding is convenient, always available at the correct temperature, and costs nothing.   Breastfeeding burns calories in the mother and helps her lose weight that was gained during pregnancy.     Breastfeeding makes the uterus contract back down to normal size faster and slows bleeding following delivery.   Breastfeeding mothers have a lower risk of developing breast cancer.  BREASTFEEDING FREQUENCY  A healthy, full-term baby may breastfeed as often as every hour or space his or her feedings to every 3 hours.   Watch your baby for signs of hunger. Nurse your baby if he or she shows signs of hunger. How often you nurse will vary from baby to baby.   Nurse as often as the baby requests, or when you feel the need to reduce the fullness of your breasts.    Awaken the baby if it has been 3 4 hours since the last feeding.   Frequent feeding will help the mother make more milk and will help prevent problems, such as sore nipples and engorgement of the breasts.  BABY'S POSITION AT THE BREAST  Whether lying down or sitting, be sure that the baby's tummy is facing your tummy.   Support the breast with 4 fingers underneath the breast and the thumb above. Make sure your fingers are well away from the nipple and baby's mouth.   Stroke the baby's lips gently with your finger or nipple.   When the baby's mouth is open wide enough, place all of your nipple and as much of the areola as possible into your baby's mouth.   Pull the baby in close so the tip of the nose and the baby's cheeks touch the breast during the feeding.  FEEDINGS AND SUCTION  The length of each feeding varies from baby to baby and from feeding to feeding.   The baby must suck about 2 3 minutes for your milk to get to him or her. This is called a "let down." For this reason, allow the baby to feed on each breast as long as he or she wants. Your baby will end the feeding when he or she has received the right balance of nutrients.   To break the suction, put your finger into the corner of the baby's mouth and slide it between his or her gums before removing your breast from his or her mouth. This will help prevent sore nipples.  HOW TO TELL WHETHER YOUR BABY IS GETTING ENOUGH BREAST MILK. Wondering whether or not your baby is getting enough milk is a common concern among mothers. You can be assured that your baby is getting enough milk if:   Your baby is actively sucking and you hear swallowing.   Your baby seems relaxed and satisfied after a feeding.   Your baby nurses at least 8 12 times in a 24 hour time period. Nurse your baby until he or she unlatches or falls asleep at the first breast (at least 10 20 minutes), then offer the second side.   Your baby is wetting  5 6 disposable diapers (6 8 cloth diapers) in a 24 hour period by 5 6 days of age.   Your baby is having at least 3 4 stools every 24 hours for the first 6 weeks. The stool should be soft and yellow.   Your baby should gain 4 7 ounces per week after he or she is 4 days old.   Your breasts feel softer after nursing.  REDUCING BREAST ENGORGEMENT  In the first week after your baby is born, you may experience signs of breast engorgement. When breasts are engorged, they feel heavy, warm, full, and may be tender to the touch. You can reduce   engorgement if you:   Nurse frequently, every 2 3 hours. Mothers who breastfeed early and often have fewer problems with engorgement.   Place light ice packs on your breasts for 10 20 minutes between feedings. This reduces swelling. Wrap the ice packs in a lightweight towel to protect your skin. Bags of frozen vegetables work well for this purpose.   Take a warm shower or apply warm, moist heat to your breast for 5 10 minutes just before each feeding. This increases circulation and helps the milk flow.   Gently massage your breast before and during the feeding. Using your finger tips, massage from the chest wall towards your nipple in a circular motion.   Make sure that the baby empties at least one breast at every feeding before switching sides.   Use a breast pump to empty the breasts if your baby is sleepy or not nursing well. You may also want to pump if you are returning to work oryou feel you are getting engorged.   Avoid bottle feeds, pacifiers, or supplemental feedings of water or juice in place of breastfeeding. Breast milk is all the food your baby needs. It is not necessary for your baby to have water or formula. In fact, to help your breasts make more milk, it is best not to give your baby supplemental feedings during the early weeks.   Be sure the baby is latched on and positioned properly while breastfeeding.   Wear a supportive  bra, avoiding underwire styles.   Eat a balanced diet with enough fluids.   Rest often, relax, and take your prenatal vitamins to prevent fatigue, stress, and anemia.  If you follow these suggestions, your engorgement should improve in 24 48 hours. If you are still experiencing difficulty, call your lactation consultant or caregiver.  CARING FOR YOURSELF Take care of your breasts  Bathe or shower daily.   Avoid using soap on your nipples.   Start feedings on your left breast at one feeding and on your right breast at the next feeding.   You will notice an increase in your milk supply 2 5 days after delivery. You may feel some discomfort from engorgement, which makes your breasts very firm and often tender. Engorgement "peaks" out within 24 48 hours. In the meantime, apply warm moist towels to your breasts for 5 10 minutes before feeding. Gentle massage and expression of some milk before feeding will soften your breasts, making it easier for your baby to latch on.   Wear a well-fitting nursing bra, and air dry your nipples for a 3 4minutes after each feeding.   Only use cotton bra pads.   Only use pure lanolin on your nipples after nursing. You do not need to wash it off before feeding the baby again. Another option is to express a few drops of breast milk and gently massage it into your nipples.  Take care of yourself  Eat well-balanced meals and nutritious snacks.   Drinking milk, fruit juice, and water to satisfy your thirst (about 8 glasses a day).   Get plenty of rest.  Avoid foods that you notice affect the baby in a bad way.  SEEK MEDICAL CARE IF:   You have difficulty with breastfeeding and need help.   You have a hard, red, sore area on your breast that is accompanied by a fever.   Your baby is too sleepy to eat well or is having trouble sleeping.   Your baby is wetting less than   6 diapers a day, by 5 days of age.   Your baby's skin or white part of  his or her eyes is more yellow than it was in the hospital.   You feel depressed.  Document Released: 11/21/2005 Document Revised: 05/22/2012 Document Reviewed: 02/19/2012 [employer] Patient Information 2013 [employer].  

## 2013-03-11 NOTE — Progress Notes (Signed)
Growth [place] @ [facility] today

## 2013-03-15 ENCOUNTER — Ambulatory Visit (INDEPENDENT_AMBULATORY_CARE_PROVIDER_SITE_OTHER): Payer: Medicaid Other | Admitting: *Deleted

## 2013-03-15 VITALS — BP 138/73

## 2013-03-15 DIAGNOSIS — O99810 Abnormal glucose complicating pregnancy: Secondary | ICD-10-CM

## 2013-03-15 DIAGNOSIS — O09529 Supervision of elderly multigravida, unspecified trimester: Secondary | ICD-10-CM

## 2013-03-15 NOTE — Progress Notes (Signed)
P-84  

## 2013-03-15 NOTE — Progress Notes (Signed)
NST reviewed and reactive.  Dorisann Schwanke L. Harraway-Greb, M.D., FACOG    

## 2013-03-18 ENCOUNTER — Encounter: Payer: Self-pay | Admitting: Obstetrics & Gynecology

## 2013-03-18 ENCOUNTER — Ambulatory Visit (INDEPENDENT_AMBULATORY_CARE_PROVIDER_SITE_OTHER): Payer: Medicaid Other | Admitting: Obstetrics and Gynecology

## 2013-03-18 VITALS — BP 131/89 | Temp 95.9°F | Wt 293.0 lb

## 2013-03-18 DIAGNOSIS — O10019 Pre-existing essential hypertension complicating pregnancy, unspecified trimester: Secondary | ICD-10-CM

## 2013-03-18 DIAGNOSIS — O99810 Abnormal glucose complicating pregnancy: Secondary | ICD-10-CM

## 2013-03-18 DIAGNOSIS — O09529 Supervision of elderly multigravida, unspecified trimester: Secondary | ICD-10-CM

## 2013-03-18 DIAGNOSIS — O09523 Supervision of elderly multigravida, third trimester: Secondary | ICD-10-CM

## 2013-03-18 DIAGNOSIS — O24419 Gestational diabetes mellitus in pregnancy, unspecified control: Secondary | ICD-10-CM

## 2013-03-18 DIAGNOSIS — O10013 Pre-existing essential hypertension complicating pregnancy, third trimester: Secondary | ICD-10-CM

## 2013-03-18 LAB — POCT URINALYSIS DIP (DEVICE)
Glucose, UA: NEGATIVE mg/dL
Hgb urine dipstick: NEGATIVE
Ketones, ur: NEGATIVE mg/dL
Nitrite: NEGATIVE

## 2013-03-18 NOTE — Progress Notes (Signed)
NST reviewed and reactive. CBG for the most part well controlled; last 2 days with elevated values post prandial of 150-200. Patient reports has run out of NPH and is going to pick it up today. Fasting this am 126. 4/7 [place] EFW 62%tile. F/U growth ultrasound on 5/5. FM/PTL precautions reviewed

## 2013-03-18 NOTE — Progress Notes (Signed)
Pulse 81 Edema trace in feet.

## 2013-03-21 ENCOUNTER — Ambulatory Visit (INDEPENDENT_AMBULATORY_CARE_PROVIDER_SITE_OTHER): Payer: Medicaid Other | Admitting: *Deleted

## 2013-03-21 VITALS — BP 130/65

## 2013-03-21 DIAGNOSIS — O10013 Pre-existing essential hypertension complicating pregnancy, third trimester: Secondary | ICD-10-CM

## 2013-03-21 DIAGNOSIS — O24419 Gestational diabetes mellitus in pregnancy, unspecified control: Secondary | ICD-10-CM

## 2013-03-21 DIAGNOSIS — O09529 Supervision of elderly multigravida, unspecified trimester: Secondary | ICD-10-CM

## 2013-03-21 DIAGNOSIS — O99810 Abnormal glucose complicating pregnancy: Secondary | ICD-10-CM

## 2013-03-21 DIAGNOSIS — O10019 Pre-existing essential hypertension complicating pregnancy, unspecified trimester: Secondary | ICD-10-CM

## 2013-03-21 NOTE — Progress Notes (Signed)
NST performed today was reviewed and was found to be reactive.  Continue recommended antenatal testing and prenatal care.  

## 2013-03-21 NOTE — Progress Notes (Signed)
P - 97 

## 2013-03-25 ENCOUNTER — Ambulatory Visit (INDEPENDENT_AMBULATORY_CARE_PROVIDER_SITE_OTHER): Payer: Medicaid Other | Admitting: Obstetrics & Gynecology

## 2013-03-25 ENCOUNTER — Other Ambulatory Visit: Payer: Self-pay | Admitting: Obstetrics & Gynecology

## 2013-03-25 VITALS — BP 130/70 | Wt 295.8 lb

## 2013-03-25 DIAGNOSIS — O10013 Pre-existing essential hypertension complicating pregnancy, third trimester: Secondary | ICD-10-CM

## 2013-03-25 DIAGNOSIS — O9989 Other specified diseases and conditions complicating pregnancy, childbirth and the puerperium: Secondary | ICD-10-CM

## 2013-03-25 DIAGNOSIS — L299 Pruritus, unspecified: Secondary | ICD-10-CM

## 2013-03-25 DIAGNOSIS — O24419 Gestational diabetes mellitus in pregnancy, unspecified control: Secondary | ICD-10-CM

## 2013-03-25 DIAGNOSIS — O99810 Abnormal glucose complicating pregnancy: Secondary | ICD-10-CM

## 2013-03-25 DIAGNOSIS — O09523 Supervision of elderly multigravida, third trimester: Secondary | ICD-10-CM

## 2013-03-25 DIAGNOSIS — O09529 Supervision of elderly multigravida, unspecified trimester: Secondary | ICD-10-CM

## 2013-03-25 DIAGNOSIS — O10019 Pre-existing essential hypertension complicating pregnancy, unspecified trimester: Secondary | ICD-10-CM

## 2013-03-25 LAB — COMPREHENSIVE METABOLIC PANEL
ALT: 13 U/L (ref 0–35)
AST: 15 U/L (ref 0–37)
CO2: 19 mEq/L (ref 19–32)
Calcium: 9.1 mg/dL (ref 8.4–10.5)
Chloride: 105 mEq/L (ref 96–112)
Potassium: 4 mEq/L (ref 3.5–5.3)
Sodium: 132 mEq/L — ABNORMAL LOW (ref 135–145)
Total Protein: 6.5 g/dL (ref 6.0–8.3)

## 2013-03-25 LAB — POCT URINALYSIS DIP (DEVICE)
Bilirubin Urine: NEGATIVE
Glucose, UA: NEGATIVE mg/dL
Ketones, ur: NEGATIVE mg/dL
Leukocytes, UA: NEGATIVE
Nitrite: NEGATIVE
Protein, ur: 100 mg/dL — AB
Specific Gravity, Urine: 1.025 (ref 1.005–1.030)
Urobilinogen, UA: 0.2 mg/dL (ref 0.0–1.0)
pH: 6.5 (ref 5.0–8.0)

## 2013-03-25 LAB — CBC
Platelets: 359 10*3/uL (ref 150–400)
RBC: 4.17 MIL/uL (ref 3.87–5.11)
RDW: 13.9 % (ref 11.5–15.5)
WBC: 8.2 10*3/uL (ref 4.0–10.5)

## 2013-03-25 MED ORDER — HYDROCORTISONE 2.5 % EX OINT: TOPICAL_OINTMENT | Freq: Two times a day (BID) | CUTANEOUS | Status: DC

## 2013-03-25 MED ORDER — HYDROXYZINE HCL 25 MG PO TABS: 25.0000 mg | ORAL_TABLET | Freq: Four times a day (QID) | ORAL | Status: DC | PRN

## 2013-03-25 NOTE — Progress Notes (Signed)
Fasting CBGs elevated above 110 for 4/7 days.  Postprandials have many values in 130s and 140s.  Increased insulin to NPH 55/25 and Reg 35/25.  Will monitor response.   Patient reports diffuse pruritus, showed areas of excoriation on knuckles and ankles, no rash identified.  She has been using HC cream, prescribed Atarax as needed.  Will also check CMET, bile acids. NST performed today was reviewed and was found to be reactive, AFI 14.1 cm.  4/7 EFW 62%, scheduled for next growth scan in about 2 weeks. Continue recommended antenatal testing and prenatal care. No other complaints or concerns.  Fetal movement and labor precautions reviewed.  Pelvic cultures next visit.

## 2013-03-25 NOTE — Progress Notes (Signed)
P-83 

## 2013-03-25 NOTE — Patient Instructions (Signed)
Return to clinic for any obstetric concerns or go to [facility] for evaluation  

## 2013-03-27 LAB — BILE ACIDS, TOTAL: Bile Acids Total: 16 umol/L (ref 0–19)

## 2013-03-29 ENCOUNTER — Ambulatory Visit (INDEPENDENT_AMBULATORY_CARE_PROVIDER_SITE_OTHER): Payer: Medicaid Other | Admitting: *Deleted

## 2013-03-29 VITALS — BP 132/79

## 2013-03-29 DIAGNOSIS — O99810 Abnormal glucose complicating pregnancy: Secondary | ICD-10-CM

## 2013-03-29 DIAGNOSIS — K831 Obstruction of bile duct: Secondary | ICD-10-CM | POA: Insufficient documentation

## 2013-03-29 DIAGNOSIS — O26619 Liver and biliary tract disorders in pregnancy, unspecified trimester: Secondary | ICD-10-CM

## 2013-03-29 DIAGNOSIS — O10019 Pre-existing essential hypertension complicating pregnancy, unspecified trimester: Secondary | ICD-10-CM

## 2013-03-29 MED ORDER — URSODIOL 500 MG PO TABS: 500.0000 mg | ORAL_TABLET | Freq: Two times a day (BID) | ORAL | Status: DC

## 2013-03-29 NOTE — Addendum Note (Signed)
Addended by: Jaynie Collins A on: 03/29/2013 11:34 AM   Modules accepted: Orders

## 2013-03-29 NOTE — Progress Notes (Signed)
NST performed today was reviewed and was found to be reactive.  Continue recommended antenatal testing and prenatal care. Ursodiol prescribed, problem list updated for cholestasis.  Will discuss diagnosis and implications in detail at next visit.

## 2013-03-29 NOTE — Patient Instructions (Addendum)
  Cholestasis of Pregnancy Cholestasis is a stopping or restriction of bile flow from the gallbladder into the intestine. It can be mild to severe. This problem usually happens during the final trimester of pregnancy. This condition occurs because the liver is not getting rid of its bile. The bile is part of the breakdown products of red blood cells. When these breakdown products (bilirubin) become elevated in the liver and spill into the blood, they produce jaundice. Jaundice is a yellowing of the skin. It is often seen first in the whites of the eyes. As the bilirubin becomes more elevated, it causes itching and sometimes a rash of the skin. CAUSES   Having problems inside or outside of the liver (gallstones blocking the opening of the gallbladder).  During pregnancy, it may be due to the elevated estrogen in the blood.  A response to the estrogen in birth control pills.  Drug or alcohol use.  Certain types of anemia (low red blood cells) present before the pregnancy. Let your caregiver know if you have anemia.  Jaundice in pregnancy is also seen with severe toxemia, severe and continuous vomiting (hyperemesis gravidarum), and acute fatty liver of pregnancy.  Other causes of jaundice that should be ruled out are hepatitis, chronic liver disease (cirrhosis), autoimmune hepatitis, and severe infectious mononucleosis. This problem is usually harmless. However, it can cause early(premature) birth and threaten infant health. This type of jaundice is similar to jaundice of the newborn. If bilirubin levels become high enough, it can cause mental retardation in the baby. SYMPTOMS   Itching, mild to severe.  Jaundice.  Rash at times. DIAGNOSIS   Increase amount of bilirubin in the blood stream.  The patient has yellow skin and the white of their eyes (jaundice). TREATMENT  This problem disappears with delivery of the baby. Treatment is usually directed at controlling the itching. If the  problem is severe and the jaundice reaches dangerous levels, labor may be induced or a cesarean section may be performed. This does not usually cause permanent liver damage in the mother. However, the cholestasis may occur in future pregnancies. HOME CARE INSTRUCTIONS   Do not use drugs or alcohol.  Do not take any medication unless suggested by your caregiver.  Keep all follow-up appointments as directed. This is important for you and your baby's health.  Only use the creams and medications for the itching given to you by your caregiver. SEEK IMMEDIATE MEDICAL CARE IF:   An unexplained oral temperature above 102 F (38.9 C) develops, or as your caregiver suggests.  Symptoms are not controlled by the medications or measures suggested by your caregiver. You seem to be getting worse.  You develop nausea, vomiting, or abdominal pain.  You develop a severe headache.  You develop vision problems (blurred or double vision).  You develop uterine contractions.  You develop vaginal bleeding. Document Released: 11/18/2000 Document Revised: 02/13/2012 Document Reviewed: 06/11/2008 [facility] Patient Information 2013 [place], [place].

## 2013-03-29 NOTE — Progress Notes (Addendum)
P = 86   Pt reports fasting CBG today was 90. She still c/o some itching all over her body- labs reviewed from 4/21- no liver function impairment, bile acids level increased.  Discussed with Dr. Macon Large, she will order Ursodiol and patient will be informed of diagnosis, need for fetal testing and early delivery.

## 2013-04-01 ENCOUNTER — Ambulatory Visit (INDEPENDENT_AMBULATORY_CARE_PROVIDER_SITE_OTHER): Payer: Medicaid Other | Admitting: Obstetrics & Gynecology

## 2013-04-01 ENCOUNTER — Telehealth: Payer: Self-pay | Admitting: *Deleted

## 2013-04-01 ENCOUNTER — Other Ambulatory Visit: Payer: Self-pay | Admitting: Obstetrics & Gynecology

## 2013-04-01 VITALS — BP 125/88 | Temp 96.6°F | Wt 293.5 lb

## 2013-04-01 DIAGNOSIS — O10013 Pre-existing essential hypertension complicating pregnancy, third trimester: Secondary | ICD-10-CM

## 2013-04-01 DIAGNOSIS — O10019 Pre-existing essential hypertension complicating pregnancy, unspecified trimester: Secondary | ICD-10-CM

## 2013-04-01 DIAGNOSIS — O99810 Abnormal glucose complicating pregnancy: Secondary | ICD-10-CM

## 2013-04-01 DIAGNOSIS — O26613 Liver and biliary tract disorders in pregnancy, third trimester: Secondary | ICD-10-CM

## 2013-04-01 DIAGNOSIS — O26619 Liver and biliary tract disorders in pregnancy, unspecified trimester: Secondary | ICD-10-CM

## 2013-04-01 DIAGNOSIS — O24419 Gestational diabetes mellitus in pregnancy, unspecified control: Secondary | ICD-10-CM

## 2013-04-01 DIAGNOSIS — K831 Obstruction of bile duct: Secondary | ICD-10-CM

## 2013-04-01 LAB — POCT URINALYSIS DIP (DEVICE)
Ketones, ur: NEGATIVE mg/dL
Protein, ur: 100 mg/dL — AB
Urobilinogen, UA: 0.2 mg/dL (ref 0.0–1.0)
pH: 6.5 (ref 5.0–8.0)

## 2013-04-01 LAB — OB RESULTS CONSOLE GBS: GBS: NEGATIVE

## 2013-04-01 NOTE — Progress Notes (Signed)
IOL scheduled 04/08/13 at 730 pm. Lancet device given to patient per her request.

## 2013-04-01 NOTE — Telephone Encounter (Addendum)
Left message on voice mail of significant other Alinda Money). I stated that I am call ing with an important message regarding her appts for Friday 5/2. Please call back and state whether it is ok to leave detailed information on his voice mail.  (pt's tel # has been disconnected) **Pt needs to be informed that she will only need ultrasound appt on 5/2 @ 0930 in [facility]- NST @ clinic is not needed.  1540- pt returned the call and was informed of appt details for 5/2 by Vanessa [place]. Pt voiced understanding.

## 2013-04-01 NOTE — Progress Notes (Signed)
[place] for growth scheduled on 5/2 @ [facility].   Delivery plan to be discussed today.

## 2013-04-01 NOTE — Progress Notes (Signed)
Discussed diagnosis of cholestasis, already in testing, will induce at 37 weeks.  Has not picked up ursodiol yet, will pick up today. Did not have log today, patient reports 90s for CBGs, 110s for postprandial.  She will bring log book on Friday 04/05/13 for review.  NST performed today was reviewed and was found to be reactive.  Continue recommended antenatal testing and prenatal care.  Pelvic cultures done today. No other complaints or concerns.  Fetal movement and labor precautions reviewed.

## 2013-04-01 NOTE — Patient Instructions (Signed)
Cholestasis of Pregnancy Cholestasis is a stopping or restriction of bile flow from the gallbladder into the intestine. It can be mild to severe. This problem usually happens during the final trimester of pregnancy. This condition occurs because the liver is not getting rid of its bile. The bile is part of the breakdown products of red blood cells. When these breakdown products (bilirubin) become elevated in the liver and spill into the blood, they produce jaundice. Jaundice is a yellowing of the skin. It is often seen first in the whites of the eyes. As the bilirubin becomes more elevated, it causes itching and sometimes a rash of the skin. CAUSES   Having problems inside or outside of the liver (gallstones blocking the opening of the gallbladder).  During pregnancy, it may be due to the elevated estrogen in the blood.  A response to the estrogen in birth control pills.  Drug or alcohol use.  Certain types of anemia (low red blood cells) present before the pregnancy. Let your caregiver know if you have anemia.  Jaundice in pregnancy is also seen with severe toxemia, severe and continuous vomiting (hyperemesis gravidarum), and acute fatty liver of pregnancy.  Other causes of jaundice that should be ruled out are hepatitis, chronic liver disease (cirrhosis), autoimmune hepatitis, and severe infectious mononucleosis. This problem is usually harmless. However, it can cause early(premature) birth and threaten infant health. This type of jaundice is similar to jaundice of the newborn. If bilirubin levels become high enough, it can cause mental retardation in the baby. SYMPTOMS   Itching, mild to severe.  Jaundice.  Rash at times. DIAGNOSIS   Increase amount of bilirubin in the blood stream.  The patient has yellow skin and the white of their eyes (jaundice). TREATMENT  This problem disappears with delivery of the baby. Treatment is usually directed at controlling the itching. If the problem  is severe and the jaundice reaches dangerous levels, labor may be induced or a cesarean section may be performed. This does not usually cause permanent liver damage in the mother. However, the cholestasis may occur in future pregnancies. HOME CARE INSTRUCTIONS   Do not use drugs or alcohol.  Do not take any medication unless suggested by your caregiver.  Keep all follow-up appointments as directed. This is important for you and your baby's health.  Only use the creams and medications for the itching given to you by your caregiver. SEEK IMMEDIATE MEDICAL CARE IF:   An unexplained oral temperature above 102 F (38.9 C) develops, or as your caregiver suggests.  Symptoms are not controlled by the medications or measures suggested by your caregiver. You seem to be getting worse.  You develop nausea, vomiting, or abdominal pain.  You develop a severe headache.  You develop vision problems (blurred or double vision).  You develop uterine contractions.  You develop vaginal bleeding. Document Released: 11/18/2000 Document Revised: 02/13/2012 Document Reviewed: 06/11/2008 [facility] Patient Information 2013 [place], [place].

## 2013-04-02 ENCOUNTER — Telehealth (HOSPITAL_COMMUNITY): Payer: Self-pay | Admitting: *Deleted

## 2013-04-02 ENCOUNTER — Encounter (HOSPITAL_COMMUNITY): Payer: Self-pay | Admitting: *Deleted

## 2013-04-02 LAB — GC/CHLAMYDIA PROBE AMP
CT Probe RNA: NEGATIVE
GC Probe RNA: NEGATIVE

## 2013-04-02 NOTE — Telephone Encounter (Signed)
Preadmission screen  

## 2013-04-04 ENCOUNTER — Encounter: Payer: Self-pay | Admitting: Obstetrics & Gynecology

## 2013-04-04 LAB — CULTURE, BETA STREP (GROUP B ONLY)

## 2013-04-05 ENCOUNTER — Ambulatory Visit (HOSPITAL_COMMUNITY)
Admission: RE | Admit: 2013-04-05 | Discharge: 2013-04-05 | Disposition: A | Discharge status: Home / Self Care | Payer: Medicaid Other | Source: Ambulatory Visit | Attending: Obstetrics & Gynecology | Admitting: Obstetrics & Gynecology

## 2013-04-05 ENCOUNTER — Ambulatory Visit (HOSPITAL_COMMUNITY)
Admission: RE | Admit: 2013-04-05 | Discharge: 2013-04-05 | Disposition: A | Discharge status: Home / Self Care | Payer: Medicaid Other | Source: Ambulatory Visit | Attending: Family Medicine | Admitting: Family Medicine

## 2013-04-05 ENCOUNTER — Other Ambulatory Visit: Payer: Medicaid Other

## 2013-04-05 DIAGNOSIS — O09299 Supervision of pregnancy with other poor reproductive or obstetric history, unspecified trimester: Secondary | ICD-10-CM | POA: Insufficient documentation

## 2013-04-05 DIAGNOSIS — I10 Essential (primary) hypertension: Secondary | ICD-10-CM

## 2013-04-05 DIAGNOSIS — O24419 Gestational diabetes mellitus in pregnancy, unspecified control: Secondary | ICD-10-CM

## 2013-04-05 DIAGNOSIS — E669 Obesity, unspecified: Secondary | ICD-10-CM | POA: Insufficient documentation

## 2013-04-05 DIAGNOSIS — O09523 Supervision of elderly multigravida, third trimester: Secondary | ICD-10-CM

## 2013-04-05 DIAGNOSIS — O09529 Supervision of elderly multigravida, unspecified trimester: Secondary | ICD-10-CM | POA: Insufficient documentation

## 2013-04-05 DIAGNOSIS — O99810 Abnormal glucose complicating pregnancy: Secondary | ICD-10-CM | POA: Insufficient documentation

## 2013-04-05 DIAGNOSIS — Z8751 Personal history of pre-term labor: Secondary | ICD-10-CM | POA: Insufficient documentation

## 2013-04-08 ENCOUNTER — Encounter (HOSPITAL_COMMUNITY): Payer: Self-pay

## 2013-04-08 ENCOUNTER — Ambulatory Visit (HOSPITAL_COMMUNITY): Payer: Medicaid Other

## 2013-04-08 ENCOUNTER — Inpatient Hospital Stay (HOSPITAL_COMMUNITY)
Admission: RE | Admit: 2013-04-08 | Discharge: 2013-04-11 | DRG: 774 | Disposition: A | Discharge status: Home / Self Care | Payer: Medicaid Other | Source: Ambulatory Visit | Attending: Family Medicine | Admitting: Family Medicine

## 2013-04-08 VITALS — BP 115/69 | HR 68 | Temp 98.3°F | Resp 20 | Ht 72.0 in | Wt 293.0 lb

## 2013-04-08 DIAGNOSIS — K838 Other specified diseases of biliary tract: Secondary | ICD-10-CM | POA: Diagnosis present

## 2013-04-08 DIAGNOSIS — Z794 Long term (current) use of insulin: Secondary | ICD-10-CM

## 2013-04-08 DIAGNOSIS — O09529 Supervision of elderly multigravida, unspecified trimester: Secondary | ICD-10-CM

## 2013-04-08 DIAGNOSIS — O24419 Gestational diabetes mellitus in pregnancy, unspecified control: Secondary | ICD-10-CM

## 2013-04-08 DIAGNOSIS — O10013 Pre-existing essential hypertension complicating pregnancy, third trimester: Secondary | ICD-10-CM

## 2013-04-08 DIAGNOSIS — O09893 Supervision of other high risk pregnancies, third trimester: Secondary | ICD-10-CM

## 2013-04-08 DIAGNOSIS — O1002 Pre-existing essential hypertension complicating childbirth: Secondary | ICD-10-CM | POA: Diagnosis present

## 2013-04-08 DIAGNOSIS — O99814 Abnormal glucose complicating childbirth: Secondary | ICD-10-CM | POA: Diagnosis present

## 2013-04-08 DIAGNOSIS — O09293 Supervision of pregnancy with other poor reproductive or obstetric history, third trimester: Secondary | ICD-10-CM

## 2013-04-08 DIAGNOSIS — O26619 Liver and biliary tract disorders in pregnancy, unspecified trimester: Principal | ICD-10-CM | POA: Diagnosis present

## 2013-04-08 HISTORY — DX: Essential (primary) hypertension: I10

## 2013-04-08 LAB — CBC
MCH: 30.1 pg (ref 26.0–34.0)
MCHC: 34.8 g/dL (ref 30.0–36.0)
MCV: 86.3 fL (ref 78.0–100.0)
Platelets: 330 10*3/uL (ref 150–400)
RDW: 13.7 % (ref 11.5–15.5)
WBC: 8.7 10*3/uL (ref 4.0–10.5)

## 2013-04-08 LAB — GLUCOSE, CAPILLARY: Glucose-Capillary: 134 mg/dL — ABNORMAL HIGH (ref 70–99)

## 2013-04-08 LAB — TYPE AND SCREEN

## 2013-04-08 MED ORDER — PHENYLEPHRINE 40 MCG/ML (10ML) SYRINGE FOR IV PUSH (FOR BLOOD PRESSURE SUPPORT)
80.0000 ug | PREFILLED_SYRINGE | INTRAVENOUS | Status: DC | PRN
  Filled 2013-04-08: qty 5
  Filled 2013-04-08: qty 2

## 2013-04-08 MED ORDER — EPHEDRINE 5 MG/ML INJ
10.0000 mg | INTRAVENOUS | Status: DC | PRN
  Filled 2013-04-08: qty 2

## 2013-04-08 MED ORDER — SODIUM CHLORIDE 0.9 % IV SOLN: INTRAVENOUS | Status: DC

## 2013-04-08 MED ORDER — DIPHENHYDRAMINE HCL 50 MG/ML IJ SOLN: 12.5000 mg | INTRAMUSCULAR | Status: DC | PRN

## 2013-04-08 MED ORDER — SODIUM CHLORIDE 0.9 % IV SOLN
INTRAVENOUS | Status: DC
  Administered 2013-04-08: 0.7 [IU]/h via INTRAVENOUS
  Filled 2013-04-08: qty 1

## 2013-04-08 MED ORDER — LACTATED RINGERS IV SOLN
500.0000 mL | Freq: Once | INTRAVENOUS | Status: AC
  Administered 2013-04-10: 500 mL via INTRAVENOUS

## 2013-04-08 MED ORDER — DEXTROSE 50 % IV SOLN: 25.0000 mL | INTRAVENOUS | Status: DC | PRN

## 2013-04-08 MED ORDER — LIDOCAINE HCL (PF) 1 % IJ SOLN
30.0000 mL | INTRAMUSCULAR | Status: DC | PRN
  Filled 2013-04-08: qty 30

## 2013-04-08 MED ORDER — LACTATED RINGERS IV SOLN: 500.0000 mL | INTRAVENOUS | Status: DC | PRN

## 2013-04-08 MED ORDER — DEXTROSE-NACL 5-0.45 % IV SOLN
INTRAVENOUS | Status: DC
  Administered 2013-04-08 – 2013-04-10 (×5): via INTRAVENOUS

## 2013-04-08 MED ORDER — TERBUTALINE SULFATE 1 MG/ML IJ SOLN: 0.2500 mg | Freq: Once | INTRAMUSCULAR | Status: AC | PRN

## 2013-04-08 MED ORDER — FENTANYL 2.5 MCG/ML BUPIVACAINE 1/10 % EPIDURAL INFUSION (WH - ANES)
14.0000 mL/h | INTRAMUSCULAR | Status: DC | PRN
  Administered 2013-04-10: 14 mL/h via EPIDURAL
  Filled 2013-04-08: qty 125

## 2013-04-08 MED ORDER — CITRIC ACID-SODIUM CITRATE 334-500 MG/5ML PO SOLN
30.0000 mL | ORAL | Status: DC | PRN
  Administered 2013-04-08: 30 mL via ORAL
  Filled 2013-04-08: qty 15

## 2013-04-08 MED ORDER — NALBUPHINE SYRINGE 5 MG/0.5 ML
10.0000 mg | INJECTION | INTRAMUSCULAR | Status: DC | PRN
  Administered 2013-04-10: 10 mg via INTRAVENOUS
  Filled 2013-04-08 (×2): qty 1

## 2013-04-08 MED ORDER — PANTOPRAZOLE SODIUM 40 MG IV SOLR
40.0000 mg | Freq: Once | INTRAVENOUS | Status: AC
  Administered 2013-04-08: 40 mg via INTRAVENOUS
  Filled 2013-04-08: qty 40

## 2013-04-08 MED ORDER — PHENYLEPHRINE 40 MCG/ML (10ML) SYRINGE FOR IV PUSH (FOR BLOOD PRESSURE SUPPORT)
80.0000 ug | PREFILLED_SYRINGE | INTRAVENOUS | Status: DC | PRN
  Filled 2013-04-08: qty 2

## 2013-04-08 MED ORDER — ACETAMINOPHEN 325 MG PO TABS: 650.0000 mg | ORAL_TABLET | ORAL | Status: DC | PRN

## 2013-04-08 MED ORDER — INSULIN REGULAR BOLUS VIA INFUSION
0.0000 [IU] | Freq: Three times a day (TID) | INTRAVENOUS | Status: DC
  Filled 2013-04-08: qty 10

## 2013-04-08 MED ORDER — OXYCODONE-ACETAMINOPHEN 5-325 MG PO TABS: 1.0000 | ORAL_TABLET | ORAL | Status: DC | PRN

## 2013-04-08 MED ORDER — IBUPROFEN 600 MG PO TABS: 600.0000 mg | ORAL_TABLET | Freq: Four times a day (QID) | ORAL | Status: DC | PRN

## 2013-04-08 MED ORDER — OXYTOCIN BOLUS FROM INFUSION
500.0000 mL | INTRAVENOUS | Status: DC
  Administered 2013-04-10: 500 mL via INTRAVENOUS

## 2013-04-08 MED ORDER — FLEET ENEMA 7-19 GM/118ML RE ENEM: 1.0000 | ENEMA | RECTAL | Status: DC | PRN

## 2013-04-08 MED ORDER — OXYTOCIN 40 UNITS IN LACTATED RINGERS INFUSION - SIMPLE MED
1.0000 m[IU]/min | INTRAVENOUS | Status: DC
  Administered 2013-04-08: 1 m[IU]/min via INTRAVENOUS
  Filled 2013-04-08: qty 1000

## 2013-04-08 MED ORDER — LACTATED RINGERS IV SOLN
INTRAVENOUS | Status: DC
  Administered 2013-04-08: 20:00:00 via INTRAVENOUS
  Administered 2013-04-09: 20 mL/h via INTRAVENOUS

## 2013-04-08 MED ORDER — ONDANSETRON HCL 4 MG/2ML IJ SOLN: 4.0000 mg | Freq: Four times a day (QID) | INTRAMUSCULAR | Status: DC | PRN

## 2013-04-08 MED ORDER — EPHEDRINE 5 MG/ML INJ
10.0000 mg | INTRAVENOUS | Status: DC | PRN
  Filled 2013-04-08: qty 2
  Filled 2013-04-08: qty 4

## 2013-04-08 MED ORDER — OXYTOCIN 40 UNITS IN LACTATED RINGERS INFUSION - SIMPLE MED: 62.5000 mL/h | INTRAVENOUS | Status: DC

## 2013-04-08 MED ORDER — SODIUM CHLORIDE 0.9 % IV SOLN
INTRAVENOUS | Status: DC
  Filled 2013-04-08: qty 1

## 2013-04-08 NOTE — H&P (Signed)
Lovinia Fiero is a 37 y.o. female presenting for IOL for Cholestasis of pregnancy. She also has A2 GDM and chronic HTN.  Denies contractions, bleeding, loss of fluid. Baby moving normally. Seen in High risk clinic for GDM, Chronic HTN, AMA. On insulin NPH/regular. No BP medications. NO headache, vision changes, RUQ pain. Did not take insulin today.   Patient Active Problem List   Diagnosis Date Noted  . Cholestasis of pregnancy 03/29/2013  . Gestational diabetes mellitus, antepartum 02/14/2013  . Carpal tunnel syndrome of right wrist 01/24/2013  . Benign essential hypertension antepartum 11/15/2012  . Chronic hypertension 11/15/2012  . Hx of preeclampsia, prior pregnancy, currently pregnant 11/15/2012  . Supervision of high-risk pregnancy 10/25/2012  . AMA (advanced maternal age) multigravida 35+ 10/22/2012  . History of gestational diabetes in prior pregnancy, currently pregnant   . History of preterm delivery, currently pregnant 10/19/2012     Maternal Medical History:  Reason for admission: Nausea.    OB History   Grav Para Term Preterm Abortions TAB SAB Ect Mult Living   4 3 2 1  0 0 0 0 0 3     Past Medical History  Diagnosis Date  . History of gestational diabetes in prior pregnancy, currently pregnant   . Gestational diabetes mellitus, antepartum 02/14/2013  . Cholelithiasis   . Gestational diabetes     insulin  . Hypertension     preeclampsia with previous pregnancy   Past Surgical History  Procedure Laterality Date  . Tubal ligation      2007   Family History: family history includes Diabetes in her father and mother; Heart disease in her mother; and Hypertension in her father and mother. Social History:  reports that she quit smoking about 6 months ago. Her smoking use included Cigarettes. She smoked 0.00 packs per day. She has never used smokeless tobacco. She reports that she does not drink alcohol or use illicit drugs.   Prenatal Transfer Tool  Maternal  Diabetes: Yes:  Diabetes Type:  Insulin/Medication controlled Genetic Screening: Normal NT only. No further testing Maternal Ultrasounds/Referrals: Normal Fetal Ultrasounds or other Referrals:  None Maternal Substance Abuse:  No Significant Maternal Medications:  Meds include: Other: Ursodiol, hydroxyzine Significant Maternal Lab Results:  Lab values include: Group B Strep negative Other Comments:  A2 GDM, Chronic HTN (not on meds), cholestasis of pregnancy  Review of Systems  Constitutional: Negative for fever and chills.  Eyes: Negative for blurred vision and double vision.  Cardiovascular: Negative for chest pain.  Gastrointestinal: Negative for nausea, vomiting, abdominal pain and diarrhea.  Genitourinary: Negative for dysuria.  Neurological: Negative for headaches.        Dilation: 1.5 Effacement (%): 50 Station: -3 Exam by:: Dr Rosalene Billings Blood pressure 139/78, pulse 74, temperature 98.5 F (36.9 C), temperature source Oral, resp. rate 20, height 6' (1.829 m), last menstrual period 07/22/2012. Maternal Exam:  Abdomen: Fetal presentation: vertex  Introitus: Normal vulva. Vagina is positive for vaginal discharge (yellow/white).  Pelvis: adequate for delivery.   Cervix: Cervix evaluated by digital exam.   1-2/50/-2  Fetal Exam Fetal Monitor Review: Mode: ultrasound.   Baseline rate: 130.  Variability: moderate (6-25 bpm).   Pattern: accelerations present and no decelerations.    Fetal State Assessment: Category I - tracings are normal.     Physical Exam  Constitutional: She is oriented to person, place, and time. She appears well-developed and well-nourished. No distress.  HENT:  Head: Normocephalic and atraumatic.  Eyes: Conjunctivae and EOM are normal.  Neck: Normal range of motion. Neck supple.  Cardiovascular: Normal rate, regular rhythm and normal heart sounds.   Respiratory: Effort normal and breath sounds normal. No respiratory distress.  GI: Soft. There is no  tenderness. There is no rebound and no guarding.  Genitourinary: Vaginal discharge (yellow/white) found.  Musculoskeletal: Normal range of motion. She exhibits no edema and no tenderness.  Neurological: She is alert and oriented to person, place, and time.  Skin: Skin is warm and dry.  Psychiatric: She has a normal mood and affect.    Prenatal labs: ABO, Rh: B/Positive/-- (10/29 0000) Antibody: Negative (10/29 0000) Rubella: Immune (10/29 0000) RPR: NON REAC (02/27 1134)  HBsAg: Negative (10/29 0000)  HIV: NON REACTIVE (02/27 1134)  GBS: Negative (04/28 0000)   CBG on arrival 121 (2 hours after last PO intake)  Assessment/Plan: 37 y.o. [id number] at [redacted]w[redacted]d here for IOL for cholestasis of pregnancy, also with A2 GDM, Chronic HTN. - Glucose stabilizer for glucose control - Monitor blood pressure and treat for > 160/110 - CMP, CBC, urine protein/creatinine - Low dose pitocin for induction. Attempted foley bulb. Bishop's score (modified) 5-6 - Anticipate SVD  Napoleon Form 04/08/2013, 9:56 PM

## 2013-04-09 LAB — GLUCOSE, CAPILLARY
Glucose-Capillary: 96 mg/dL (ref 70–99)
Glucose-Capillary: 97 mg/dL (ref 70–99)
Glucose-Capillary: 104 mg/dL — ABNORMAL HIGH (ref 70–99)
Glucose-Capillary: 92 mg/dL (ref 70–99)
Glucose-Capillary: 108 mg/dL — ABNORMAL HIGH (ref 70–99)
Glucose-Capillary: 104 mg/dL — ABNORMAL HIGH (ref 70–99)
Glucose-Capillary: 83 mg/dL (ref 70–99)

## 2013-04-09 LAB — ABO/RH: ABO/RH(D): B POS

## 2013-04-09 MED ORDER — OXYTOCIN 40 UNITS IN LACTATED RINGERS INFUSION - SIMPLE MED: 1.0000 m[IU]/min | INTRAVENOUS | Status: DC

## 2013-04-09 MED ORDER — INSULIN NPH (HUMAN) (ISOPHANE) 100 UNIT/ML ~~LOC~~ SUSP
10.0000 [IU] | Freq: Two times a day (BID) | SUBCUTANEOUS | Status: DC
  Administered 2013-04-09 – 2013-04-10 (×3): 10 [IU] via SUBCUTANEOUS
  Filled 2013-04-09: qty 10

## 2013-04-09 MED ORDER — MISOPROSTOL 25 MCG QUARTER TABLET
25.0000 ug | ORAL_TABLET | ORAL | Status: DC | PRN
  Administered 2013-04-09: 25 ug via VAGINAL
  Filled 2013-04-09: qty 0.25
  Filled 2013-04-09: qty 1

## 2013-04-09 MED ORDER — TERBUTALINE SULFATE 1 MG/ML IJ SOLN: 0.2500 mg | Freq: Once | INTRAMUSCULAR | Status: AC | PRN

## 2013-04-09 MED ORDER — OXYTOCIN 40 UNITS IN LACTATED RINGERS INFUSION - SIMPLE MED
1.0000 m[IU]/min | INTRAVENOUS | Status: DC
  Administered 2013-04-09: 2 m[IU]/min via INTRAVENOUS
  Administered 2013-04-10: 32 m[IU]/min via INTRAVENOUS
  Filled 2013-04-09: qty 1000

## 2013-04-09 MED ORDER — INSULIN ASPART 100 UNIT/ML ~~LOC~~ SOLN: 0.0000 [IU] | SUBCUTANEOUS | Status: DC

## 2013-04-09 MED ORDER — SODIUM CHLORIDE 0.9 % IV SOLN
INTRAVENOUS | Status: DC | PRN
  Filled 2013-04-09: qty 1

## 2013-04-09 NOTE — Progress Notes (Addendum)
Diabetes Treatment Program Recommendations  ADA Standards of Care 2012 Diabetes in Pregnancy Target Glucose Ranges:  Fasting: 60 - 90 mg/dL Preprandial: 60 - 409 mg/dL 1 hr postprandial: Less than 140mg /dL (from first bite of meal) 2 hr postprandial: Less than 120 mg/dL (from first bit of meal)  RN reports patient been on GlucoStabilizer for 12 hrs, with total units of 10.2. Therefore when transitioning to subcutaneous insulin, recommend using 10 units NPH in the am and 10 units NPH at HS. In addition, please use correction scale of moderate q 4 hrs. (can increase to resistant if needed.)  Recommend starting NPH 10 units now and stop GS in 2 hrs.and use correction concurrently with discontinuation of drip. Spoke with Resident, Dr Fara Boros and got verbal order for NPH and Correction Novolog If GlucoStabilizer is restarted, Please use the Diabetic Pregnant Patient order set and enter the portion under GS on the order set.  Glucostabilizer order in chart as a PRN order when needed.

## 2013-04-09 NOTE — Progress Notes (Signed)
Darlene Bennett is a 37 y.o. [phone] at [redacted]w[redacted]d admitted for induction of labor due to cholestasis.  Also with A2DM on insulin and cHTN, controlled without medication.  Subjective: Feeling fine, no pain or contractions   Objective: BP 137/80  Pulse 68  Temp(Src) 98.2 F (36.8 C) (Oral)  Resp 18  Ht 6' (1.829 m)  Wt 132.904 kg (293 lb)  BMI 39.73 kg/m2  SpO2 97%  LMP 07/22/2012      FHT:  FHR: 130-140 bpm, variability: moderate,  accelerations:  Present,  decelerations:  Present had 2 late decels ~2 hours ago, no further decels with position change UC:   irregular SVE:   2-3/60/-2, mid Labs: Lab Results  Component Value Date   WBC 8.7 04/08/2013   HGB 12.3 04/08/2013   HCT 35.3* 04/08/2013   MCV 86.3 04/08/2013   PLT 330 04/08/2013    Assessment / Plan: IOL for cholestasis, s/p pit x12hr then cytotec x1, foley bulb placed, some blood return  Labor: not in active labor Preeclampsia:  BPs stable, monitor for elevation, initiate med if >160/110 Fetal Wellbeing:  Category I Pain Control:  Labor support without medications I/D:  n/a Anticipated MOD:  NSVD DM: diabetic coordinator on board and helping, off of GS now, given 10U NPH, will give additional 10U this PM; checking CBGs q4hr, could restart GS when in active labor if having elevated CBGs; most recent CBG 83  Lilit Cinelli 04/09/2013, 2:30 PM

## 2013-04-09 NOTE — Progress Notes (Signed)
Darlene Bennett is a 37 y.o. [phone] at [redacted]w[redacted]d admitted for induction of labor due to cholestasis.  Also with A2DM on insulin and cHTN, controlled without medication.  Subjective: Feeling fine, no pain or contractions, eating dinner   Objective: BP 126/76  Pulse 64  Temp(Src) 98.2 F (36.8 C) (Oral)  Resp 18  Ht 6' (1.829 m)  Wt 132.904 kg (293 lb)  BMI 39.73 kg/m2  SpO2 97%  LMP 07/22/2012      FHT:  FHR: 120 bpm, variability: moderate,  accelerations:  Present,  decelerations:  Absent UC:   Irregular, infrequent  SVE:    Dilation: 2.5 Effacement (%): 50 Cervical Position: Posterior Station: -3 Presentation: Vertex Exam by:: Roney Marion, CNM  Labs: Lab Results  Component Value Date   WBC 8.7 04/08/2013   HGB 12.3 04/08/2013   HCT 35.3* 04/08/2013   MCV 86.3 04/08/2013   PLT 330 04/08/2013    Assessment / Plan: IOL for cholestasis, s/p pit x12hr then cytotec x1,   Now with FB in place  Labor: not in active labor Preeclampsia:  BPs stable, monitor for elevation, initiate med if >160/110 Fetal Wellbeing:  Category I Pain Control:  Labor support without medications I/D:  n/a Anticipated MOD:  NSVD DM: diabetic coordinator on board and helping, off of GS now, given 10U NPH, will give additional 10U this PM; checking CBGs q4hr, could restart GS when in active labor if having elevated CBGs; most recent CBG 83 @1300   Darlene Bennett 04/09/2013, 5:49 PM

## 2013-04-09 NOTE — H&P (Signed)
Chart reviewed and agree with management and plan.  

## 2013-04-09 NOTE — Progress Notes (Signed)
Carel Tomassi is a 37 y.o. [phone] at [redacted]w[redacted]d admitted for induction of labor due to cholestasis.  Also with A2DM on insulin and cHTN, controlled without medication.  Subjective: Feeling fine, no pain or contractions despite being on pitocin x12 hours  Objective: BP 129/81  Pulse 71  Temp(Src) 97.8 F (36.6 C) (Oral)  Resp 18  Ht 6' (1.829 m)  Wt 132.904 kg (293 lb)  BMI 39.73 kg/m2  SpO2 97%  LMP 07/22/2012      FHT:  FHR: 120 bpm, variability: moderate,  accelerations:  Present,  decelerations:  Absent UC:   irregular SVE:   Dilation: 1.5 Effacement (%): 50 Station: -3 Exam by:: Alisi Lupien, MD  Labs: Lab Results  Component Value Date   WBC 8.7 04/08/2013   HGB 12.3 04/08/2013   HCT 35.3* 04/08/2013   MCV 86.3 04/08/2013   PLT 330 04/08/2013    Assessment / Plan: IOL for cholestasis, no cervical change on pitocin x12 hrs, will try cytotec x1 then hopefully FB and/or pit  Labor: no active Preeclampsia:  BPs stable, monitor for elevation, initial med if >160/110 Fetal Wellbeing:  Category I Pain Control:  Labor support without medications I/D:  n/a Anticipated MOD:  NSVD DM: currently on glucostabilizer with well controlled CBGs, most recently 92; home NPH was 52 qAM, 22 qhs but pt was not taking it; will continue glucostablizer for now, as no low CBGs.    Octaviano Mukai 04/09/2013, 9:37 AM

## 2013-04-09 NOTE — Progress Notes (Signed)
Darlene Bennett is a 37 y.o. [phone] at [redacted]w[redacted]d induction of labor due to cholestasis.  Also with A2DM on insulin and cHTN, controlled without medication.  Subjective: Foley bulb fell out, not feeling any contractions.  Did have large clot come out after foley bulb fell out.   Objective: BP 124/53  Pulse 80  Temp(Src) 98.1 F (36.7 C) (Oral)  Resp 18  Ht 6' (1.829 m)  Wt 132.904 kg (293 lb)  BMI 39.73 kg/m2  SpO2 97%  LMP 07/22/2012      FHT:  FHR: 130 bpm, variability: moderate,  accelerations:  Present,  decelerations:  Absent UC:   occasional SVE:   Dilation: 3 Effacement (%): 80 Station: -3 Exam by:: Dr. Fara Boros  Labs: Lab Results  Component Value Date   WBC 8.7 04/08/2013   HGB 12.3 04/08/2013   HCT 35.3* 04/08/2013   MCV 86.3 04/08/2013   PLT 330 04/08/2013    Assessment / Plan: IOL for cholestasis, s/p FB, now out, will restart pitocin   Labor: not active Preeclampsia:  BPs stable, monitor for elevation, initiate med if >160/110 Fetal Wellbeing:  Category I Pain Control:  Labor support without medications I/D:  n/a Anticipated MOD:  NSVD DM: diabetic coordinator on board and helping, off of GS now, given 10U NPH, will give additional 10U this PM; checking CBGs q4hr, could restart GS when in active labor if having elevated CBGs; most recent CBG 94   Vasili Fok 04/09/2013, 7:58 PM

## 2013-04-10 ENCOUNTER — Encounter (HOSPITAL_COMMUNITY): Payer: Self-pay | Admitting: Anesthesiology

## 2013-04-10 ENCOUNTER — Inpatient Hospital Stay (HOSPITAL_COMMUNITY): Payer: Medicaid Other | Admitting: Anesthesiology

## 2013-04-10 ENCOUNTER — Encounter (HOSPITAL_COMMUNITY): Payer: Self-pay

## 2013-04-10 DIAGNOSIS — O1002 Pre-existing essential hypertension complicating childbirth: Secondary | ICD-10-CM

## 2013-04-10 DIAGNOSIS — O99814 Abnormal glucose complicating childbirth: Secondary | ICD-10-CM

## 2013-04-10 DIAGNOSIS — O26619 Liver and biliary tract disorders in pregnancy, unspecified trimester: Secondary | ICD-10-CM

## 2013-04-10 DIAGNOSIS — K838 Other specified diseases of biliary tract: Secondary | ICD-10-CM

## 2013-04-10 LAB — GLUCOSE, CAPILLARY: Glucose-Capillary: 99 mg/dL (ref 70–99)

## 2013-04-10 MED ORDER — TETANUS-DIPHTH-ACELL PERTUSSIS 5-2.5-18.5 LF-MCG/0.5 IM SUSP: 0.5000 mL | Freq: Once | INTRAMUSCULAR | Status: DC

## 2013-04-10 MED ORDER — SIMETHICONE 80 MG PO CHEW: 80.0000 mg | CHEWABLE_TABLET | ORAL | Status: DC | PRN

## 2013-04-10 MED ORDER — BENZOCAINE-MENTHOL 20-0.5 % EX AERO: 1.0000 "application " | INHALATION_SPRAY | CUTANEOUS | Status: DC | PRN

## 2013-04-10 MED ORDER — WITCH HAZEL-GLYCERIN EX PADS: 1.0000 "application " | MEDICATED_PAD | CUTANEOUS | Status: DC | PRN

## 2013-04-10 MED ORDER — PRENATAL MULTIVITAMIN CH
1.0000 | ORAL_TABLET | Freq: Every day | ORAL | Status: DC
  Administered 2013-04-11: 1 via ORAL
  Filled 2013-04-10: qty 1

## 2013-04-10 MED ORDER — IBUPROFEN 600 MG PO TABS
600.0000 mg | ORAL_TABLET | Freq: Four times a day (QID) | ORAL | Status: DC
  Administered 2013-04-10 – 2013-04-11 (×3): 600 mg via ORAL
  Filled 2013-04-10 (×3): qty 1

## 2013-04-10 MED ORDER — LIDOCAINE HCL (PF) 1 % IJ SOLN
INTRAMUSCULAR | Status: DC | PRN
  Administered 2013-04-10 (×2): 5 mL

## 2013-04-10 MED ORDER — SENNOSIDES-DOCUSATE SODIUM 8.6-50 MG PO TABS
2.0000 | ORAL_TABLET | Freq: Every day | ORAL | Status: DC
  Administered 2013-04-10: 2 via ORAL

## 2013-04-10 MED ORDER — ONDANSETRON HCL 4 MG/2ML IJ SOLN: 4.0000 mg | INTRAMUSCULAR | Status: DC | PRN

## 2013-04-10 MED ORDER — ONDANSETRON HCL 4 MG PO TABS: 4.0000 mg | ORAL_TABLET | ORAL | Status: DC | PRN

## 2013-04-10 MED ORDER — LANOLIN HYDROUS EX OINT: TOPICAL_OINTMENT | CUTANEOUS | Status: DC | PRN

## 2013-04-10 MED ORDER — DIBUCAINE 1 % RE OINT: 1.0000 "application " | TOPICAL_OINTMENT | RECTAL | Status: DC | PRN

## 2013-04-10 MED ORDER — OXYCODONE-ACETAMINOPHEN 5-325 MG PO TABS: 1.0000 | ORAL_TABLET | ORAL | Status: DC | PRN

## 2013-04-10 MED ORDER — DIPHENHYDRAMINE HCL 25 MG PO CAPS: 25.0000 mg | ORAL_CAPSULE | Freq: Four times a day (QID) | ORAL | Status: DC | PRN

## 2013-04-10 MED ORDER — ZOLPIDEM TARTRATE 5 MG PO TABS: 5.0000 mg | ORAL_TABLET | Freq: Every evening | ORAL | Status: DC | PRN

## 2013-04-10 NOTE — Progress Notes (Signed)
Darlene Bennett is a 37 y.o. [phone] at [redacted]w[redacted]d   Subjective: Feels that ctx are getting stronger; rec'd Nubain 10mg  at approx 0530 with good relief  Objective: BP 124/70  Pulse 63  Temp(Src) 98.3 F (36.8 C) (Oral)  Resp 18  Ht 6' (1.829 m)  Wt 293 lb (132.904 kg)  BMI 39.73 kg/m2  SpO2 97%  LMP 07/22/2012    CBGs: 99-101  FHT:  FHR: 130 bpm, variability: moderate,  accelerations:  Abscent,  decelerations:  Absent 10x10 accels noted UC:   irregular, every 3-5 minutes per pt with Pitocin at 43mu/min SVE:   Dilation: 4.5 Effacement (%): 70 Station: -3 Exam by:: A.Davis,RN (exam from 0530)  Labs: Lab Results  Component Value Date   WBC 8.7 04/08/2013   HGB 12.3 04/08/2013   HCT 35.3* 04/08/2013   MCV 86.3 04/08/2013   PLT 330 04/08/2013    Assessment / Plan: Latent phase Decreased FHR variability due to meds A2 GDM- stable on NPH 10 BID CHTN- stable without meds  Continue to obs and examine cx as indicated  Darlene Bennett 04/10/2013, 6:43 AM

## 2013-04-10 NOTE — Progress Notes (Signed)
Darlene Bennett is a 36 y.o. [phone] at [redacted]w[redacted]d   Subjective: Not feeling contractions  Objective: BP 149/104  Pulse 77  Temp(Src) 98.4 F (36.9 C) (Oral)  Resp 18  Ht 6' (1.829 m)  Wt 293 lb (132.904 kg)  BMI 39.73 kg/m2  SpO2 97%  LMP 07/22/2012    CBGs: 90-101  FHT:  FHR: 120 bpm, variability: minimal to moderate,  accelerations:  Abscent,  decelerations:  Absent  10x10 accels noted UC:   irregular, every 3-5 minutes SVE:   Dilation: 5.5 Effacement (%): 90 Station: -3 Exam by:: Roney Marion, CNM forebag AROM this check. IUPC placed  Labs: Lab Results  Component Value Date   WBC 8.7 04/08/2013   HGB 12.3 04/08/2013   HCT 35.3* 04/08/2013   MCV 86.3 04/08/2013   PLT 330 04/08/2013    Assessment / Plan: Latent phase Decreased FHR variability due to meds, now improved A2 GDM- stable on NPH 10 BID CHTN- stable without meds, continue to monitor IUPC placed to help with titration of pitocin  Continue to obs and examine cx as indicated  Levert Feinstein 04/10/2013, 12:22 PM

## 2013-04-10 NOTE — Anesthesia Procedure Notes (Signed)
Epidural Patient location during procedure: OB Start time: 04/10/2013 12:25 PM  Staffing Anesthesiologist: Angus Seller., Harrell Gave. Performed by: anesthesiologist   Preanesthetic Checklist Completed: patient identified, site marked, surgical consent, pre-op evaluation, timeout performed, IV checked, risks and benefits discussed and monitors and equipment checked  Epidural Patient position: sitting Prep: site prepped and draped and DuraPrep Patient monitoring: continuous pulse ox and blood pressure Approach: midline Injection technique: LOR air and LOR saline  Needle:  Needle type: Tuohy  Needle gauge: 17 G Needle length: 9 cm and 9 Needle insertion depth: 8 cm Catheter type: closed end flexible Catheter size: 19 Gauge Catheter at skin depth: 14 cm Test dose: negative  Assessment Events: blood not aspirated, injection not painful, no injection resistance, negative IV test and no paresthesia  Additional Notes Patient identified.  Risk benefits discussed including failed block, incomplete pain control, headache, nerve damage, paralysis, blood pressure changes, nausea, vomiting, reactions to medication both toxic or allergic, and postpartum back pain.  Patient expressed understanding and wished to proceed.  All questions were answered.  Sterile technique used throughout procedure and epidural site dressed with sterile barrier dressing. No paresthesia or other complications noted.The patient did not experience any signs of intravascular injection such as tinnitus or metallic taste in mouth nor signs of intrathecal spread such as rapid motor block. Please see nursing notes for vital signs.

## 2013-04-10 NOTE — Anesthesia Preprocedure Evaluation (Signed)
Anesthesia Evaluation  Patient identified by MRN, date of birth, ID band Patient awake    Reviewed: Allergy & Precautions, H&P , Patient's Chart, lab work & pertinent test results  Airway Mallampati: III TM Distance: >3 FB Neck ROM: full    Dental no notable dental hx.    Pulmonary neg pulmonary ROS,  breath sounds clear to auscultation  Pulmonary exam normal       Cardiovascular negative cardio ROS  Rhythm:regular Rate:Normal     Neuro/Psych  Neuromuscular disease negative neurological ROS  negative psych ROS   GI/Hepatic negative GI ROS, Neg liver ROS,   Endo/Other  negative endocrine ROSdiabetesMorbid obesity  Renal/GU negative Renal ROS     Musculoskeletal   Abdominal   Peds  Hematology negative hematology ROS (+)   Anesthesia Other Findings History of gestational diabetes in prior pregnancy, currently pregnant     Gestational diabetes mellitus, antepartum 02/14/2013      Cholelithiasis     Gestational diabetes   insulin    Hypertension   preeclampsia with previous pregnancy    Reproductive/Obstetrics (+) Pregnancy                           Anesthesia Physical Anesthesia Plan  ASA: III  Anesthesia Plan: Epidural   Post-op Pain Management:    Induction:   Airway Management Planned:   Additional Equipment:   Intra-op Plan:   Post-operative Plan:   Informed Consent: I have reviewed the patients History and Physical, chart, labs and discussed the procedure including the risks, benefits and alternatives for the proposed anesthesia with the patient or authorized representative who has indicated his/her understanding and acceptance.     Plan Discussed with:   Anesthesia Plan Comments:         Anesthesia Quick Evaluation

## 2013-04-11 LAB — CBC
HCT: 31.2 % — ABNORMAL LOW (ref 36.0–46.0)
MCV: 86.2 fL (ref 78.0–100.0)
Platelets: 272 10*3/uL (ref 150–400)
RBC: 3.62 MIL/uL — ABNORMAL LOW (ref 3.87–5.11)
RDW: 13.8 % (ref 11.5–15.5)
WBC: 9.5 10*3/uL (ref 4.0–10.5)

## 2013-04-11 MED ORDER — IBUPROFEN 600 MG PO TABS: 600.0000 mg | ORAL_TABLET | Freq: Four times a day (QID) | ORAL | Status: DC

## 2013-04-11 MED ORDER — SENNOSIDES-DOCUSATE SODIUM 8.6-50 MG PO TABS: 2.0000 | ORAL_TABLET | Freq: Every day | ORAL | Status: DC

## 2013-04-11 NOTE — Anesthesia Postprocedure Evaluation (Signed)
  Anesthesia Post-op Note  Patient: Darlene Bennett  Procedure(s) Performed: * No procedures listed *  Patient Location: Mother/Baby  Anesthesia Type:Epidural  Level of Consciousness: awake  Airway and Oxygen Therapy: Patient Spontanous Breathing  Post-op Pain: none  Post-op Assessment: Patient's Cardiovascular Status Stable, Respiratory Function Stable, Patent Airway, No signs of Nausea or vomiting, Adequate PO intake, Pain level controlled, No headache, No backache, No residual numbness and No residual motor weakness  Post-op Vital Signs: Reviewed and stable  Complications: No apparent anesthesia complications

## 2013-04-11 NOTE — Progress Notes (Signed)
UR chart review completed.  

## 2013-04-11 NOTE — Discharge Summary (Signed)
Obstetric Discharge Summary  Darlene Bennett is a 37 y.o. [id number] admitted at 37w for IOL for cholestasis of pregnancy. She also had A2 GDM and Chronic HTN.  She underwent successful induction and normal SVD with no complications. Her postpartum course was unremarkable. She is breastfeeding and is considering Depo Provera shot until her husband can get a vasectomy. Her itching from cholestasis has completely resolved. Patient advised she will get 2 hour GTT at postpartum visit. Blood pressures are controlled postpartum.    Reason for Admission: induction of labor Prenatal Procedures: none Intrapartum Procedures: spontaneous vaginal delivery Postpartum Procedures: none Complications-Operative and Postpartum: none  Hemoglobin  Date Value Range Status  04/11/2013 10.7* 12.0 - 15.0 g/dL Final  45/40/9811 91.4   Final     HCT  Date Value Range Status  04/11/2013 31.2* 36.0 - 46.0 % Final  10/02/2012 40   Final   Filed Vitals:   04/10/13 1620 04/10/13 1703 04/10/13 2015 04/11/13 0550  BP: 130/79 138/82 128/83 115/69  Pulse: 83 84 84 68  Temp: 98.5 F (36.9 C)  98.4 F (36.9 C) 98.3 F (36.8 C)  TempSrc:   Oral Oral  Resp: 22 20 20 20   Height:      Weight:      SpO2: 98%  96%    Physical Exam:  General: alert, cooperative and no distress Lochia: appropriate Uterine Fundus: firm Incision: n/a DVT Evaluation: No evidence of DVT seen on physical exam. Negative Homan's sign. No cords or calf tenderness. No significant calf/ankle edema.  Discharge Diagnoses: Term Pregnancy-delivered and Cholestasis of pregnancy, A2 Gestational diabetes, Chronic hypertension  Discharge Information: Date: 04/11/2013 Activity: pelvic rest Diet: routine Medications: PNV, Ibuprofen and Colace Condition: stable Instructions: refer to practice specific booklet Discharge to: home Follow-up Information   Follow up with [facility] In 1 week. (for Depo shot. )    Contact information:   [address] [place] [address] [phone]      Follow up with [facility] In 6 weeks. (For postpartum visit and 2 hour glucose tolerance test)    Contact information:   [address] [place] [address] [phone]      Newborn Data: Live born female  Birth Weight: 5 lb 6.6 oz (2455 g) APGAR: 8, 9  Home with mother.  Napoleon Form 04/11/2013, 6:06 PM

## 2013-04-15 ENCOUNTER — Ambulatory Visit: Payer: Medicaid Other

## 2013-04-15 NOTE — Discharge Summary (Signed)
Attestation of Attending Supervision of Advanced Practitioner (CNM/NP)/fellow: Evaluation and management procedures were performed by the Advanced Practitioner under my supervision and collaboration.  I have reviewed the Advanced Practitioner's note and chart, and I agree with the management and plan.  HARRAWAY-Saetern, Erynne Kealey 11:00 AM

## 2013-05-10 ENCOUNTER — Ambulatory Visit: Payer: Medicaid Other | Admitting: Obstetrics and Gynecology

## 2013-05-10 ENCOUNTER — Encounter: Payer: Self-pay | Admitting: Obstetrics and Gynecology

## 2013-05-10 DIAGNOSIS — F17200 Nicotine dependence, unspecified, uncomplicated: Secondary | ICD-10-CM

## 2013-05-10 DIAGNOSIS — O1002 Pre-existing essential hypertension complicating childbirth: Secondary | ICD-10-CM

## 2013-05-10 DIAGNOSIS — Z3042 Encounter for surveillance of injectable contraceptive: Secondary | ICD-10-CM

## 2013-05-10 DIAGNOSIS — Z3049 Encounter for surveillance of other contraceptives: Secondary | ICD-10-CM

## 2013-05-10 MED ORDER — MEDROXYPROGESTERONE ACETATE 150 MG/ML IM SUSP
150.0000 mg | Freq: Once | INTRAMUSCULAR | Status: AC
  Administered 2013-05-10: 150 mg via INTRAMUSCULAR

## 2013-05-10 MED ORDER — MEDROXYPROGESTERONE ACETATE 150 MG/ML IM SUSP: 150.0000 mg | INTRAMUSCULAR | Status: DC

## 2013-05-10 MED ORDER — HYDROCHLOROTHIAZIDE 25 MG PO TABS: 25.0000 mg | ORAL_TABLET | Freq: Every day | ORAL | Status: DC

## 2013-05-10 NOTE — Patient Instructions (Signed)
Smoking Cessation Quitting smoking is important to your health and has many advantages. However, it is not always easy to quit since nicotine is a very addictive drug. Often times, people try 3 times or more before being able to quit. This document explains the best ways for you to prepare to quit smoking. Quitting takes hard work and a lot of effort, but you can do it. ADVANTAGES OF QUITTING SMOKING  You will live longer, feel better, and live better.  Your body will feel the impact of quitting smoking almost immediately.  Within 20 minutes, blood pressure decreases. Your pulse returns to its normal level.  After 8 hours, carbon monoxide levels in the blood return to normal. Your oxygen level increases.  After 24 hours, the chance of having a heart attack starts to decrease. Your breath, hair, and body stop smelling like smoke.  After 48 hours, damaged nerve endings begin to recover. Your sense of taste and smell improve.  After 72 hours, the body is virtually free of nicotine. Your bronchial tubes relax and breathing becomes easier.  After 2 to 12 weeks, lungs can hold more air. Exercise becomes easier and circulation improves.  The risk of having a heart attack, stroke, cancer, or lung disease is greatly reduced.  After 1 year, the risk of coronary heart disease is cut in half.  After 5 years, the risk of stroke falls to the same as a nonsmoker.  After 10 years, the risk of lung cancer is cut in half and the risk of other cancers decreases significantly.  After 15 years, the risk of coronary heart disease drops, usually to the level of a nonsmoker.  If you are pregnant, quitting smoking will improve your chances of having a healthy baby.  The people you live with, especially any children, will be healthier.  You will have extra money to spend on things other than cigarettes. QUESTIONS TO THINK ABOUT BEFORE ATTEMPTING TO QUIT You may want to talk about your answers with your  caregiver.  Why do you want to quit?  If you tried to quit in the past, what helped and what did not?  What will be the most difficult situations for you after you quit? How will you plan to handle them?  Who can help you through the tough times? Your family? Friends? A caregiver?  What pleasures do you get from smoking? What ways can you still get pleasure if you quit? Here are some questions to ask your caregiver:  How can you help me to be successful at quitting?  What medicine do you think would be best for me and how should I take it?  What should I do if I need more help?  What is smoking withdrawal like? How can I get information on withdrawal? GET READY  Set a quit date.  Change your environment by getting rid of all cigarettes, ashtrays, matches, and lighters in your home, car, or work. Do not let people smoke in your home.  Review your past attempts to quit. Think about what worked and what did not. GET SUPPORT AND ENCOURAGEMENT You have a better chance of being successful if you have help. You can get support in many ways.  Tell your family, friends, and co-workers that you are going to quit and need their support. Ask them not to smoke around you.  Get individual, group, or telephone counseling and support. Programs are available at local hospitals and health centers. Call your local health department for   information about programs in your area.  Spiritual beliefs and practices may help some smokers quit.  Download a "quit meter" on your computer to keep track of quit statistics, such as how long you have gone without smoking, cigarettes not smoked, and money saved.  Get a self-help book about quitting smoking and staying off of tobacco. LEARN NEW SKILLS AND BEHAVIORS  Distract yourself from urges to smoke. Talk to someone, go for a walk, or occupy your time with a task.  Change your normal routine. Take a different route to work. Drink tea instead of coffee.  Eat breakfast in a different place.  Reduce your stress. Take a hot bath, exercise, or read a book.  Plan something enjoyable to do every day. Reward yourself for not smoking.  Explore interactive web-based programs that specialize in helping you quit. GET MEDICINE AND USE IT CORRECTLY Medicines can help you stop smoking and decrease the urge to smoke. Combining medicine with the above behavioral methods and support can greatly increase your chances of successfully quitting smoking.  Nicotine replacement therapy helps deliver nicotine to your body without the negative effects and risks of smoking. Nicotine replacement therapy includes nicotine gum, lozenges, inhalers, nasal sprays, and skin patches. Some may be available over-the-counter and others require a prescription.  Antidepressant medicine helps people abstain from smoking, but how this works is unknown. This medicine is available by prescription.  Nicotinic receptor partial agonist medicine simulates the effect of nicotine in your brain. This medicine is available by prescription. Ask your caregiver for advice about which medicines to use and how to use them based on your health history. Your caregiver will tell you what side effects to look out for if you choose to be on a medicine or therapy. Carefully read the information on the package. Do not use any other product containing nicotine while using a nicotine replacement product.  RELAPSE OR DIFFICULT SITUATIONS Most relapses occur within the first 3 months after quitting. Do not be discouraged if you start smoking again. Remember, most people try several times before finally quitting. You may have symptoms of withdrawal because your body is used to nicotine. You may crave cigarettes, be irritable, feel very hungry, cough often, get headaches, or have difficulty concentrating. The withdrawal symptoms are only temporary. They are strongest when you first quit, but they will go away within  10 14 days. To reduce the chances of relapse, try to:  Avoid drinking alcohol. Drinking lowers your chances of successfully quitting.  Reduce the amount of caffeine you consume. Once you quit smoking, the amount of caffeine in your body increases and can give you symptoms, such as a rapid heartbeat, sweating, and anxiety.  Avoid smokers because they can make you want to smoke.  Do not let weight gain distract you. Many smokers will gain weight when they quit, usually less than 10 pounds. Eat a healthy diet and stay active. You can always lose the weight gained after you quit.  Find ways to improve your mood other than smoking. FOR MORE INFORMATION  www.smokefree.gov  Document Released: 11/15/2001 Document Revised: 05/22/2012 Document Reviewed: 03/01/2012 [employer] Patient Information 2014 [place], [place]. Medroxyprogesterone injection [Contraceptive] What is this medicine? MEDROXYPROGESTERONE (me DROX ee proe JES te rone) contraceptive injections prevent pregnancy. They provide effective birth control for 3 months. Depo-subQ Provera 104 is also used for treating pain related to endometriosis. This medicine may be used for other purposes; ask your health care provider or pharmacist if you have questions.  What should I tell my health care provider before I take this medicine? They need to know if you have any of these conditions: -frequently drink alcohol -asthma -blood vessel disease or a history of a blood clot in the lungs or legs -bone disease such as osteoporosis -breast cancer -diabetes -eating disorder (anorexia nervosa or bulimia) -high blood pressure -HIV infection or AIDS -kidney disease -liver disease -mental depression -migraine -seizures (convulsions) -stroke -tobacco smoker -vaginal bleeding -an unusual or allergic reaction to medroxyprogesterone, other hormones, medicines, foods, dyes, or preservatives -pregnant or trying to get pregnant -breast-feeding How  should I use this medicine? Depo-Provera Contraceptive injection is given into a muscle. Depo-subQ Provera 104 injection is given under the skin. These injections are given by a health care professional. You must not be pregnant before getting an injection. The injection is usually given during the first 5 days after the start of a menstrual period or 6 weeks after delivery of a baby. Talk to your pediatrician regarding the use of this medicine in children. Special care may be needed. These injections have been used in female children who have started having menstrual periods. Overdosage: If you think you have taken too much of this medicine contact a poison control center or emergency room at once. NOTE: This medicine is only for you. Do not share this medicine with others. What if I miss a dose? Try not to miss a dose. You must get an injection once every 3 months to maintain birth control. If you cannot keep an appointment, call and reschedule it. If you wait longer than 13 weeks between Depo-Provera contraceptive injections or longer than 14 weeks between Depo-subQ Provera 104 injections, you could get pregnant. Use another method for birth control if you miss your appointment. You may also need a pregnancy test before receiving another injection. What may interact with this medicine? Do not take this medicine with any of the following medications: -bosentan This medicine may also interact with the following medications: -aminoglutethimide -antibiotics or medicines for infections, especially rifampin, rifabutin, rifapentine, and griseofulvin -aprepitant -barbiturate medicines such as phenobarbital or primidone -bexarotene -carbamazepine -medicines for seizures like ethotoin, felbamate, oxcarbazepine, phenytoin, topiramate -modafinil -St. John's wort This list may not describe all possible interactions. Give your health care provider a list of all the medicines, herbs, non-prescription drugs,  or dietary supplements you use. Also tell them if you smoke, drink alcohol, or use illegal drugs. Some items may interact with your medicine. What should I watch for while using this medicine? This drug does not protect you against HIV infection (AIDS) or other sexually transmitted diseases. Use of this product may cause you to lose calcium from your bones. Loss of calcium may cause weak bones (osteoporosis). Only use this product for more than 2 years if other forms of birth control are not right for you. The longer you use this product for birth control the more likely you will be at risk for weak bones. Ask your health care professional how you can keep strong bones. You may have a change in bleeding pattern or irregular periods. Many females stop having periods while taking this drug. If you have received your injections on time, your chance of being pregnant is very low. If you think you may be pregnant, see your health care professional as soon as possible. Tell your health care professional if you want to get pregnant within the next year. The effect of this medicine may last a long time after you get  your last injection. What side effects may I notice from receiving this medicine? Side effects that you should report to your doctor or health care professional as soon as possible: -allergic reactions like skin rash, itching or hives, swelling of the face, lips, or tongue -breast tenderness or discharge -breathing problems -changes in vision -depression -feeling faint or lightheaded, falls -fever -pain in the abdomen, chest, groin, or leg -problems with balance, talking, walking -unusually weak or tired -yellowing of the eyes or skin Side effects that usually do not require medical attention (report to your doctor or health care professional if they continue or are bothersome): -acne -fluid retention and swelling -headache -irregular periods, spotting, or absent periods -temporary pain,  itching, or skin reaction at site where injected -weight gain This list may not describe all possible side effects. Call your doctor for medical advice about side effects. You may report side effects to FDA at 1-800-FDA-1088. Where should I keep my medicine? This does not apply. The injection will be given to you by a health care professional. NOTE: This sheet is a summary. It may not cover all possible information. If you have questions about this medicine, talk to your doctor, pharmacist, or health care provider.  2013, [employer] Standard. (12/12/2008 6:37:56 PM)

## 2013-05-10 NOTE — Progress Notes (Unsigned)
  Subjective:     Darlene Bennett is a 37 y.o. female who presents for a postpartum visit. She is 4 weeks postpartum following a spontaneous vaginal delivery. I have fully reviewed the prenatal and intrapartum course. Pregnancy complicated by CHTN, A2 GDM, cholestasis of pregnancy. The delivery was at IOL x2 days and SVD at 37 gestational weeks. Outcome: spontaneous vaginal delivery. Anesthesia: epidural. Postpartum course has been uncomplicated. Baby's course has been uncomplicated. Baby is feeding by breast and bottle. Bleeding staining only. Bowel function is normal. Bladder function is normal. Patient is not sexually active. Contraception method is abstinence. Postpartum depression screening: negative.  The following portions of the patient's history were reviewed and updated as appropriate: allergies, current medications, past family history, past medical history, past social history, past surgical history and problem list.  Review of Systems Pertinent items are noted in HPI. She is concerned with ankle edema ot improving   Objective:    BP 166/102  Pulse 69  Temp(Src) 97.6 F (36.4 C) (Oral)  Ht 5\' 10"  (1.778 m)  Wt 282 lb 3.2 oz (128.005 kg)  BMI 40.49 kg/m2  Breastfeeding? Yes  General:  alert and no distress   Breasts:  inspection negative, no nipple discharge or bleeding, no masses or nodularity palpable  Lungs: clear to auscultation bilaterally  Heart:  regular rate and rhythm, S1, S2 normal, no murmur, click, rub or gallop  Abdomen: soft, non-tender; bowel sounds normal; no masses,  no organomegaly and large diastasis   Vulva:  normal  Vagina: normal vagina, no discharge, exudate, lesion, or erythema  Cervix:  multiparous appearance  Corpus: normal size, contour, position, consistency, mobility, non-tender  Adnexa:  normal adnexa  Rectal Exam: Not performed.        Assessment:     4 wks postpartum exam. Pap smear not done at today's visit. (Due Nov 2014) Obesity CHTN not  controlled A2GDM  Smoker  Plan:    1. Contraception: Depo-Provera injections. Will start Depo today. 2. D/W Dr. Shawnie Pons re starting HCTZ 25 mg po qd for [facility] and F/U with PMD (needs referral for PMD) 3. Follow up in: 3 months for Depo or as needed.  Smoking cessation discussed and AVS given. Exercise/weight loss discussed. Return in 1 month for 2hr OGTT (or at PCP visit) to F/U A2 GDM

## 2013-05-17 ENCOUNTER — Other Ambulatory Visit: Payer: Medicaid Other

## 2013-05-17 DIAGNOSIS — O99814 Abnormal glucose complicating childbirth: Secondary | ICD-10-CM

## 2013-05-18 LAB — GLUCOSE TOLERANCE, 2 HOURS W/ 1HR
Glucose, 1 hour: 133 mg/dL (ref 70–170)
Glucose, 2 hour: 58 mg/dL — ABNORMAL LOW (ref 70–139)

## 2013-06-26 ENCOUNTER — Ambulatory Visit: Payer: Medicaid Other | Admitting: Obstetrics & Gynecology

## 2013-07-26 ENCOUNTER — Ambulatory Visit (INDEPENDENT_AMBULATORY_CARE_PROVIDER_SITE_OTHER): Payer: Medicaid Other | Admitting: Obstetrics and Gynecology

## 2013-07-26 VITALS — BP 149/99 | HR 75 | Ht 72.0 in | Wt 277.0 lb

## 2013-07-26 DIAGNOSIS — Z3049 Encounter for surveillance of other contraceptives: Secondary | ICD-10-CM

## 2013-07-26 MED ORDER — MEDROXYPROGESTERONE ACETATE 150 MG/ML IM SUSP
150.0000 mg | INTRAMUSCULAR | Status: AC
  Administered 2013-07-26: 150 mg via INTRAMUSCULAR

## 2013-10-11 ENCOUNTER — Ambulatory Visit (INDEPENDENT_AMBULATORY_CARE_PROVIDER_SITE_OTHER): Payer: Medicaid Other | Admitting: *Deleted

## 2013-10-11 VITALS — BP 130/90 | HR 69 | Ht 72.0 in | Wt 285.0 lb

## 2013-10-11 DIAGNOSIS — Z3049 Encounter for surveillance of other contraceptives: Secondary | ICD-10-CM

## 2013-10-11 DIAGNOSIS — Z309 Encounter for contraceptive management, unspecified: Secondary | ICD-10-CM

## 2013-10-11 MED ORDER — MEDROXYPROGESTERONE ACETATE 104 MG/0.65ML ~~LOC~~ SUSP
104.0000 mg | Freq: Once | SUBCUTANEOUS | Status: AC
  Administered 2013-10-11: 104 mg via SUBCUTANEOUS

## 2013-10-11 NOTE — Addendum Note (Signed)
Addended by: Candelaria Stagers E on: 10/11/2013 08:35 AM   Modules accepted: Level of Service

## 2014-01-03 ENCOUNTER — Ambulatory Visit (INDEPENDENT_AMBULATORY_CARE_PROVIDER_SITE_OTHER): Payer: Medicaid Other | Admitting: General Practice

## 2014-01-03 VITALS — BP 152/96 | HR 74 | Temp 98.2°F | Ht 72.0 in | Wt 299.4 lb

## 2014-01-03 DIAGNOSIS — Z3049 Encounter for surveillance of other contraceptives: Secondary | ICD-10-CM

## 2014-01-03 MED ORDER — MEDROXYPROGESTERONE ACETATE 104 MG/0.65ML ~~LOC~~ SUSP
104.0000 mg | Freq: Once | SUBCUTANEOUS | Status: AC
  Administered 2014-01-03: 104 mg via SUBCUTANEOUS

## 2014-03-28 ENCOUNTER — Ambulatory Visit (INDEPENDENT_AMBULATORY_CARE_PROVIDER_SITE_OTHER): Payer: Medicaid Other | Admitting: *Deleted

## 2014-03-28 ENCOUNTER — Encounter: Payer: Self-pay | Admitting: *Deleted

## 2014-03-28 VITALS — BP 146/84 | HR 86 | Ht 72.0 in | Wt 304.2 lb

## 2014-03-28 DIAGNOSIS — Z3049 Encounter for surveillance of other contraceptives: Secondary | ICD-10-CM

## 2014-03-28 MED ORDER — MEDROXYPROGESTERONE ACETATE 104 MG/0.65ML ~~LOC~~ SUSP
104.0000 mg | Freq: Once | SUBCUTANEOUS | Status: AC
  Administered 2014-03-28: 104 mg via SUBCUTANEOUS

## 2014-06-20 ENCOUNTER — Ambulatory Visit (INDEPENDENT_AMBULATORY_CARE_PROVIDER_SITE_OTHER): Payer: Medicaid Other | Admitting: *Deleted

## 2014-06-20 VITALS — BP 119/69 | HR 69 | Temp 98.1°F | Wt 302.6 lb

## 2014-06-20 DIAGNOSIS — Z3049 Encounter for surveillance of other contraceptives: Secondary | ICD-10-CM

## 2014-06-20 DIAGNOSIS — Z30013 Encounter for initial prescription of injectable contraceptive: Secondary | ICD-10-CM

## 2014-06-20 MED ORDER — MEDROXYPROGESTERONE ACETATE 104 MG/0.65ML ~~LOC~~ SUSP
104.0000 mg | Freq: Once | SUBCUTANEOUS | Status: AC
  Administered 2014-06-20: 104 mg via SUBCUTANEOUS

## 2014-09-12 ENCOUNTER — Ambulatory Visit (INDEPENDENT_AMBULATORY_CARE_PROVIDER_SITE_OTHER): Payer: Medicaid Other | Admitting: General Practice

## 2014-09-12 VITALS — BP 158/93 | HR 69 | Temp 98.4°F | Ht 72.0 in | Wt 299.5 lb

## 2014-09-12 DIAGNOSIS — Z3042 Encounter for surveillance of injectable contraceptive: Secondary | ICD-10-CM

## 2014-09-12 MED ORDER — MEDROXYPROGESTERONE ACETATE 104 MG/0.65ML ~~LOC~~ SUSP
104.0000 mg | Freq: Once | SUBCUTANEOUS | Status: AC
  Administered 2014-09-12: 104 mg via SUBCUTANEOUS

## 2014-09-12 NOTE — Progress Notes (Signed)
Discussed with patient importance of scheduling appt here to follow up with a doctor for annual exam and also provided patient with community health and wellness' information to schedule a new patient appt. Patient verbalized understanding

## 2014-10-06 ENCOUNTER — Encounter: Payer: Self-pay | Admitting: *Deleted

## 2014-10-17 ENCOUNTER — Ambulatory Visit (INDEPENDENT_AMBULATORY_CARE_PROVIDER_SITE_OTHER): Payer: Medicaid Other | Admitting: Family Medicine

## 2014-10-17 ENCOUNTER — Other Ambulatory Visit (HOSPITAL_COMMUNITY)
Admission: RE | Admit: 2014-10-17 | Discharge: 2014-10-17 | Disposition: A | Discharge status: Home / Self Care | Payer: Medicaid Other | Source: Ambulatory Visit | Attending: Family Medicine | Admitting: Family Medicine

## 2014-10-17 ENCOUNTER — Encounter: Payer: Self-pay | Admitting: Family Medicine

## 2014-10-17 VITALS — BP 141/87 | HR 75 | Temp 98.5°F | Ht 72.0 in | Wt 308.1 lb

## 2014-10-17 DIAGNOSIS — Z01419 Encounter for gynecological examination (general) (routine) without abnormal findings: Secondary | ICD-10-CM | POA: Diagnosis not present

## 2014-10-17 DIAGNOSIS — Z1151 Encounter for screening for human papillomavirus (HPV): Secondary | ICD-10-CM | POA: Diagnosis present

## 2014-10-17 DIAGNOSIS — O1002 Pre-existing essential hypertension complicating childbirth: Secondary | ICD-10-CM

## 2014-10-17 LAB — LIPID PANEL
Cholesterol: 206 mg/dL — ABNORMAL HIGH (ref 0–200)
HDL: 38 mg/dL — ABNORMAL LOW (ref >39)
LDL CALC: 148 mg/dL — AB (ref 0–99)
TRIGLYCERIDES: 102 mg/dL (ref <150)
Total CHOL/HDL Ratio: 5.4 Ratio
VLDL: 20 mg/dL (ref 0–40)

## 2014-10-17 LAB — HEMOGLOBIN A1C
Hgb A1c MFr Bld: 6.9 % — ABNORMAL HIGH (ref <5.7)
Mean Plasma Glucose: 151 mg/dL — ABNORMAL HIGH (ref <117)

## 2014-10-17 LAB — COMPREHENSIVE METABOLIC PANEL
ALK PHOS: 102 U/L (ref 39–117)
ALT: 24 U/L (ref 0–35)
AST: 15 U/L (ref 0–37)
Albumin: 3.8 g/dL (ref 3.5–5.2)
BILIRUBIN TOTAL: 0.2 mg/dL (ref 0.2–1.2)
BUN: 15 mg/dL (ref 6–23)
CO2: 20 mEq/L (ref 19–32)
Calcium: 9.3 mg/dL (ref 8.4–10.5)
Chloride: 108 mEq/L (ref 96–112)
Creat: 0.97 mg/dL (ref 0.50–1.10)
GLUCOSE: 128 mg/dL — AB (ref 70–99)
Potassium: 4.3 mEq/L (ref 3.5–5.3)
SODIUM: 140 meq/L (ref 135–145)
TOTAL PROTEIN: 7.1 g/dL (ref 6.0–8.3)

## 2014-10-17 IMAGING — US US OB NUCHAL TRANSLUCENCY 1ST GEST
1 series · 12 of 28 positions shown · non-contrast
Comparison: none

OBSTETRICS REPORT
                      (Signed Final 10/18/2012 [DATE])

Service(s) Provided
 US FETAL NUCHAL TRANSLUCENCY                          76813.0
 MEASUREMENT
Indications
 First trimester aneuploidy screen (NT)
 Advanced maternal age (AMA), Multigravida
 Poor obstetric history: Previous preterm delivery
 (34 weeks)
 Poor obstetric history: Previous gestational
 diabetes
 Poor obstetric history: Previous preeclampsia (37
 week induction)
 Poor obstetric history: Previous fetal growth
 restriction (FGR)
Fetal Evaluation
 Num Of Fetuses:    1
 Fetal Heart Rate:  165                          bpm
 Cardiac Activity:  Observed
 Presentation:      Cephalic
 Placenta:          Posterior
 Amniotic Fluid
 AFI FV:      Subjectively within normal limits
Biometry
 CRL:     62.9  mm     G. Age:  12w 4d                 EDD:    04/28/13
 NT:       2.1  mm
Gestational Age
 LMP:           12w 4d        Date:  07/22/12                 EDD:   04/28/13
 Best:          12w 4d     Det. By:  LMP  (07/22/12)          EDD:   04/28/13
Anatomy
 Cranium:          Appears normal         Bladder:          Appears normal
 Choroid Plexus:   Appears normal         Lower             Visualized
                                          Extremities:
 Stomach:          Appears normal, left   Upper             Visualized
                   sided                  Extremities:
 Other:  Nasal bone visualized.
Cervix Uterus Adnexa
 Cervix:       Normal appearance by transabdominal scan.
 Left Ovary:    Within normal limits.
 Right Ovary:   Within normal limits.
 Adnexa:     No abnormality visualized.
Impression
 36/37 yr old [id number] at 54w1d with likely chronic
 hypertension, history of preeclampsia, and previous preterm
 delivery, for first trimester screen.

[Series 1: us ob nuchal translucency 1st gest · 12 of 34 slices shown]
[im 2/34]
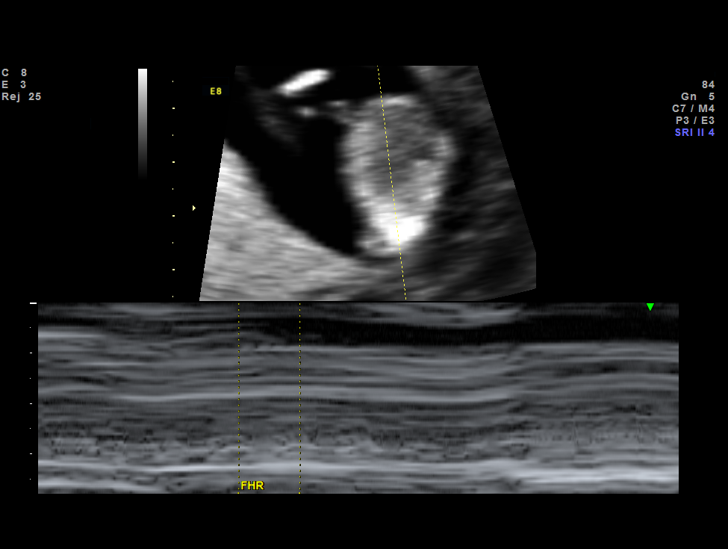
[im 4/34]
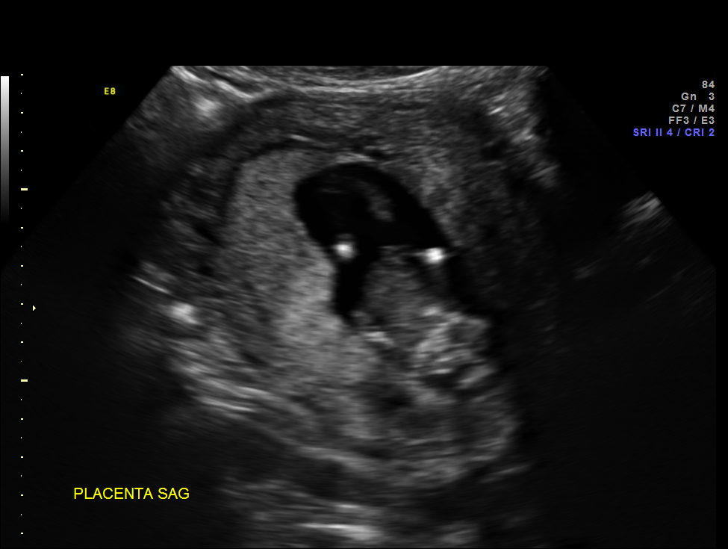
[im 7/34]
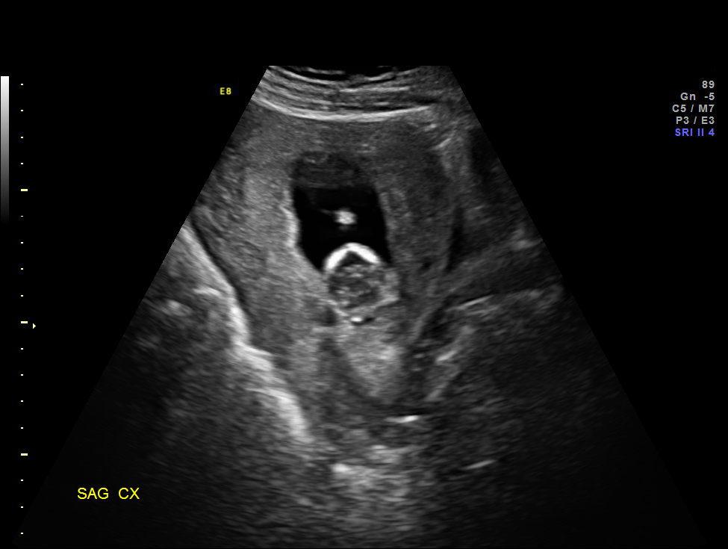
[im 10/34]
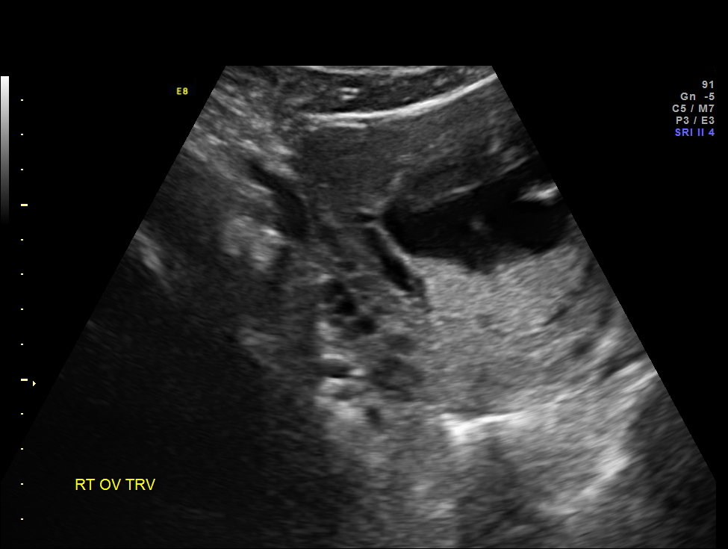
[im 13/34]
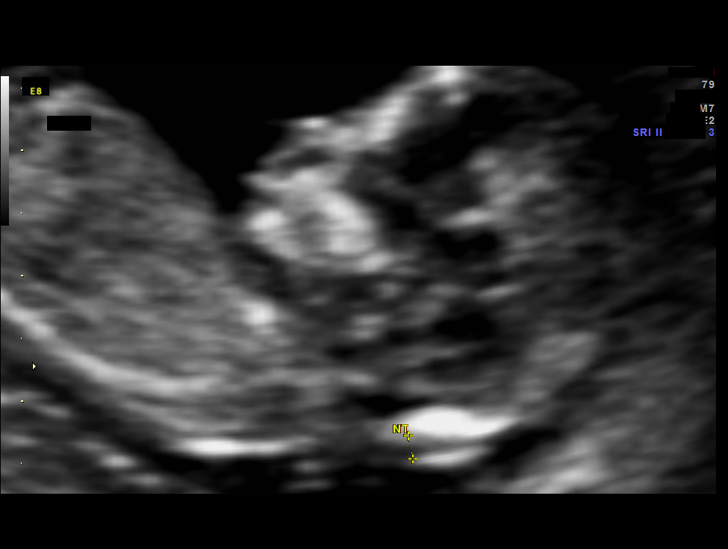
[im 15/34]
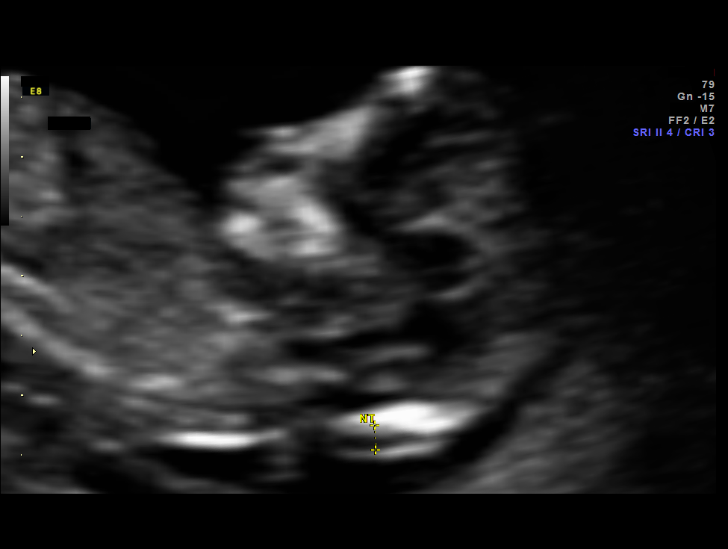
[im 19/34]
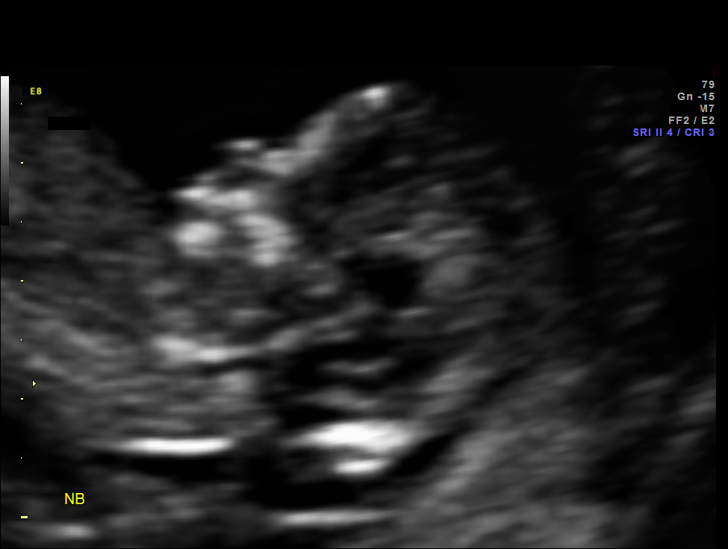
[im 21/34]
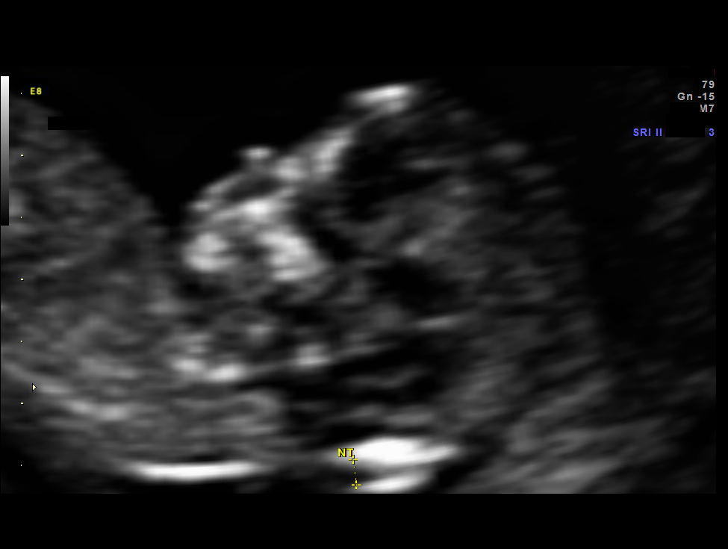
[im 24/34]
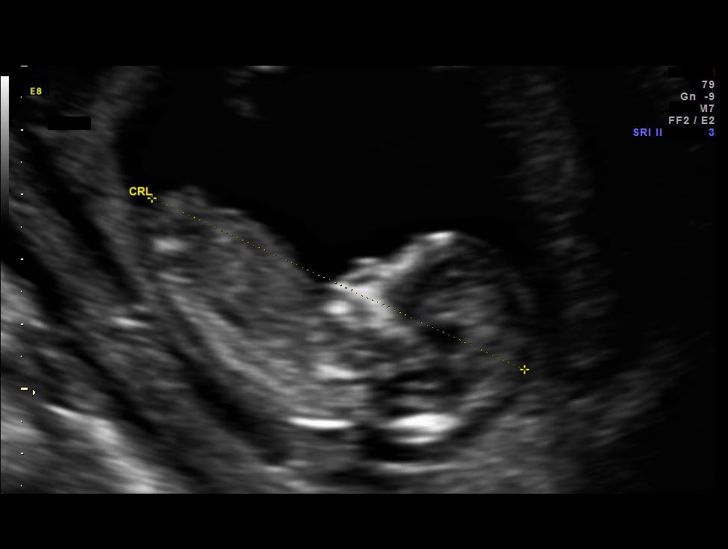
[im 27/34]
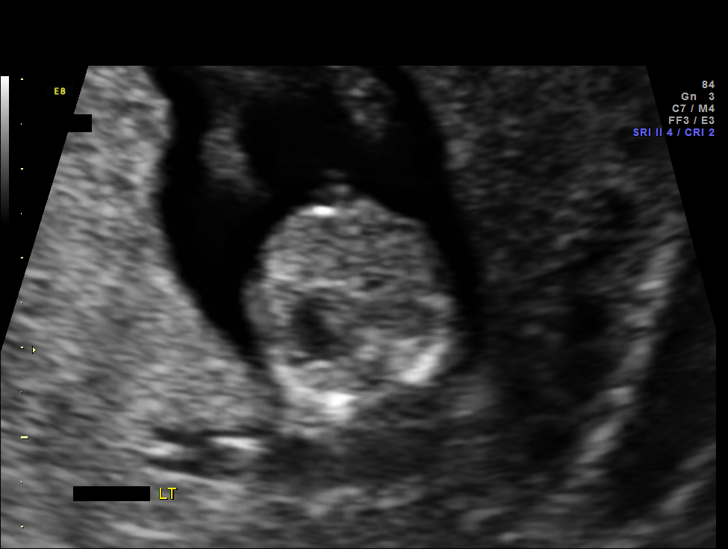
[im 30/34]
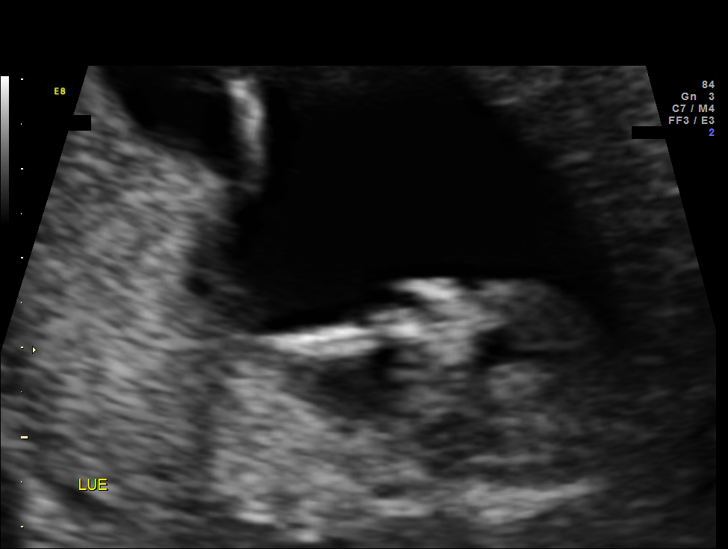
[im 32/34]
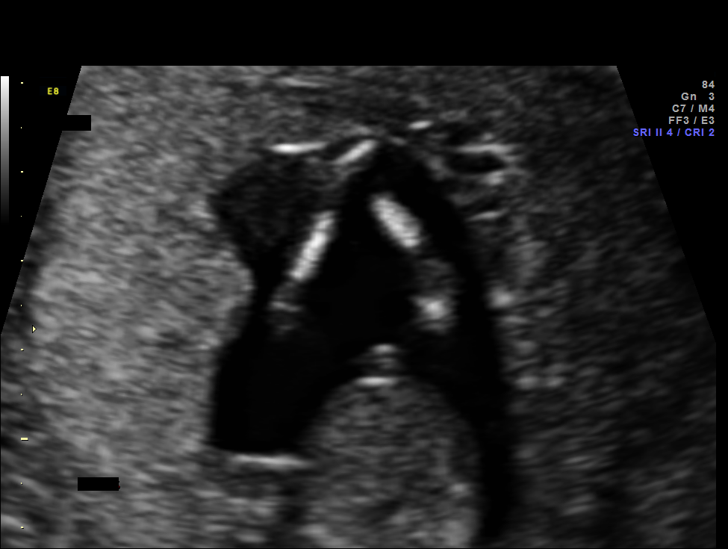

[12 of 28 positions shown; findings below may reference images not displayed]

FINDINGS: 1. Single intrauterine pregnancy.
 2. Fetal crown rump length is consistent with dating.
 3. Normal uterus; no adnexal masses seen.
 4. Evaluation of fetal anatomy is limited by early gestational
 age.
 5. Normal nuchal translucency measuring 2.1mm.
 6. The nasal bone is visualized.
Recommendations

 1. Advanced maternal age:
 - meet with genetic counselor; see separate report
 - after counseling patient declined all aneuploidy screening
 2. Hypertension:
 - no consult requested
 - recommend fetal growth every 4 weeks starting at 24 weeks
 gestation
 - recommend antenatal testing starting at 32 weeks gestation
 3. History of preeclampsia:
 - no consult requested
 - recommend obtain baseline 24 hour urine, AST, ALT, CBC,
 uric acid
 - recommend close surveillance for the development of
 signs/symptoms of preeclampsia
 4. Possible history of growth restricted baby:
 - patient is unsure
 - based on the records appears may have had 7lb87oz baby
 at 37 weeks
 - given history of preeclampsia and fetal growth restriction
 may consider low dose aspirin in this pregnancy to reduce
 the recurrence risks
 5. Recommend fetal anatomic survey at 18-20 weeks
 gestation

 questions or concerns.

## 2014-10-17 MED ORDER — HYDROCHLOROTHIAZIDE 25 MG PO TABS: 25.0000 mg | ORAL_TABLET | Freq: Every day | ORAL | Status: DC

## 2014-10-17 NOTE — Addendum Note (Signed)
Addended by: Truett Mainland on: 10/17/2014 10:05 AM   Modules accepted: Orders

## 2014-10-17 NOTE — Progress Notes (Signed)
  Subjective:     Darlene Bennett is a 38 y.o. female and is here for a comprehensive physical exam. The patient reports no problems.  She has not been taking HCTZ for blood pressure - states she has never taken it.  Does not have a regular doctor.   History   Social History  . Marital Status: Single    Spouse Name: N/A    Number of Children: N/A  . Years of Education: N/A   Occupational History  . Not on file.   Social History Main Topics  . Smoking status: Former Smoker    Types: Cigarettes    Quit date: 10/02/2012  . Smokeless tobacco: Never Used  . Alcohol Use: No  . Drug Use: No  . Sexual Activity: Yes    Birth Control/ Protection: None   Other Topics Concern  . Not on file   Social History Narrative   Health Maintenance  Topic Date Due  . Samul Dada  01/05/1995  . INFLUENZA VACCINE  07/05/2014  . PAP SMEAR  10/20/2015    The following portions of the patient's history were reviewed and updated as appropriate: allergies, current medications, past family history, past medical history, past social history, past surgical history and problem list.  Review of Systems A comprehensive review of systems was negative.   Objective:    BP 141/87 mmHg  Pulse 75  Temp(Src) 98.5 F (36.9 C)  Ht 6' (1.829 m)  Wt 308 lb 1.6 oz (139.753 kg)  BMI 41.78 kg/m2  Breastfeeding? No General appearance: alert, cooperative and no distress Head: Normocephalic, without obvious abnormality, atraumatic Neck: no adenopathy, no carotid bruit, no JVD, supple, symmetrical, trachea midline and thyroid not enlarged, symmetric, no tenderness/mass/nodules Lungs: clear to auscultation bilaterally Breasts: normal appearance, no masses or tenderness Heart: regular rate and rhythm, S1, S2 normal, no murmur, click, rub or gallop Abdomen: soft, non-tender; bowel sounds normal; no masses,  no organomegaly Pelvic: cervix normal in appearance, external genitalia normal, no adnexal masses or  tenderness, no cervical motion tenderness, rectovaginal septum normal, uterus normal size, shape, and consistency and vagina normal without discharge Extremities: extremities normal, atraumatic, no cyanosis or edema Pulses: 2+ and symmetric Skin: Skin color, texture, turgor normal. No rashes or lesions Lymph nodes: Cervical, supraclavicular, and axillary nodes normal. Neurologic: Alert and oriented X 3, normal strength and tone. Normal symmetric reflexes. Normal coordination and gait    Assessment:    1.  Healthy female exam.  2.  HTN     Plan:     1.  Start HCTZ 2.  Check lipid panel, CMP, HgA1c 3.  Referral to [facility] See After Visit Summary for Counseling Recommendations

## 2014-10-21 LAB — CYTOLOGY - PAP

## 2014-10-28 ENCOUNTER — Telehealth: Payer: Self-pay | Admitting: *Deleted

## 2014-10-28 NOTE — Telephone Encounter (Signed)
-----   Message from Truett Mainland, DO sent at 10/27/2014  4:17 PM EST ----- Please let the patient know that her HgA1c is elevated - she has diabetes.  Can you see when her appt is with Family Medicine?

## 2014-10-28 NOTE — Telephone Encounter (Signed)
Attempted to contact patient, no answer, left message for patient to call clinic for results.  Contacted Jackie at [facility],, referral form faxed, they will mail patient the packet and call her with an appointment.

## 2014-10-28 NOTE — Telephone Encounter (Signed)
Patient called into clinic, informed of results and that [facility] would be calling her with an appointment and a packet would be mailed to her.  Pt has no other questions and verbalizes understanding.

## 2014-12-12 ENCOUNTER — Ambulatory Visit (INDEPENDENT_AMBULATORY_CARE_PROVIDER_SITE_OTHER): Payer: Medicaid Other | Admitting: *Deleted

## 2014-12-12 VITALS — BP 155/90 | HR 76 | Ht 72.0 in | Wt 313.4 lb

## 2014-12-12 DIAGNOSIS — Z3042 Encounter for surveillance of injectable contraceptive: Secondary | ICD-10-CM

## 2014-12-12 MED ORDER — MEDROXYPROGESTERONE ACETATE 104 MG/0.65ML ~~LOC~~ SUSP
104.0000 mg | Freq: Once | SUBCUTANEOUS | Status: AC
  Administered 2014-12-12: 104 mg via SUBCUTANEOUS

## 2014-12-12 NOTE — Progress Notes (Signed)
Pt's bp elevated. She stated that she has not taken her bp meds. Advised her to take them as soon as she gets home. She denies any headaches or visual disturbances. Patient also inquired about a referral that was supposed to be made for her to [facility] to establish primary care. Advised patient that I will follow up on this to get her scheduled. Patient agreeable to this.

## 2014-12-16 ENCOUNTER — Telehealth: Payer: Self-pay | Admitting: *Deleted

## 2014-12-16 DIAGNOSIS — I10 Essential (primary) hypertension: Secondary | ICD-10-CM

## 2014-12-16 DIAGNOSIS — E119 Type 2 diabetes mellitus without complications: Secondary | ICD-10-CM

## 2014-12-16 NOTE — Telephone Encounter (Signed)
Patient informed me that she never got her appointment at [facility]. I resent Referral form and entered referral in epic.

## 2014-12-19 ENCOUNTER — Encounter: Payer: Self-pay | Admitting: *Deleted

## 2014-12-23 ENCOUNTER — Ambulatory Visit: Payer: Medicaid Other | Admitting: Obstetrics and Gynecology

## 2014-12-30 IMAGING — US US OB FOLLOW-UP
1 series · 12 of 28 positions shown · non-contrast
Comparison: none

[Series 1: us ob follow-up · 0.21mm/px · 46 acquisitions, 12 frames shown]
[im 2/46]
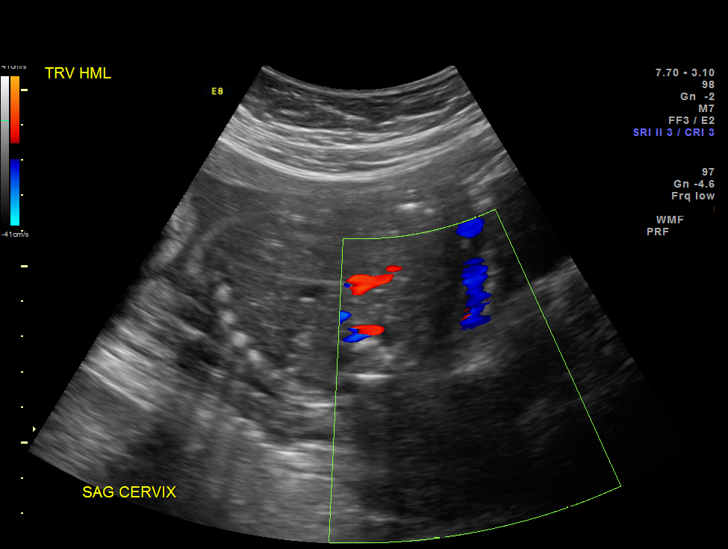
[im 6/46]
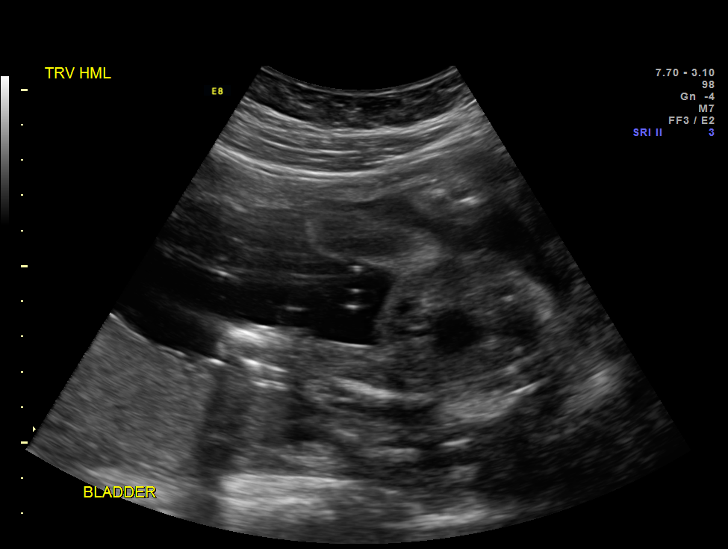
[im 9/46]
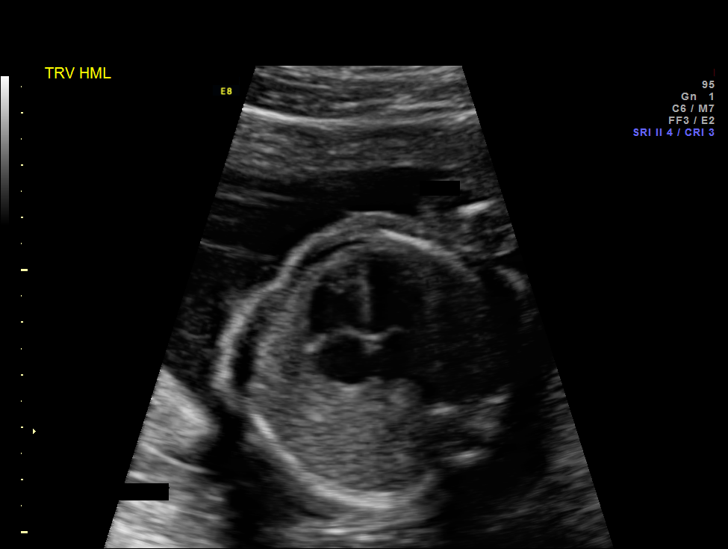
[im 14/46]
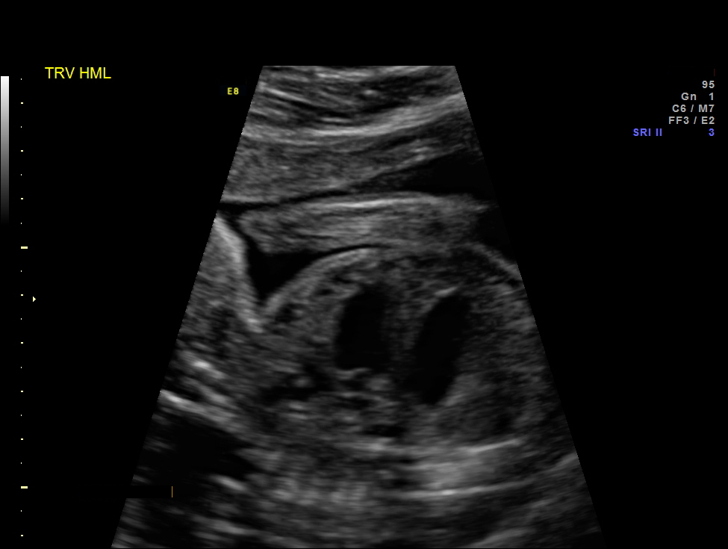
[im 17/46]
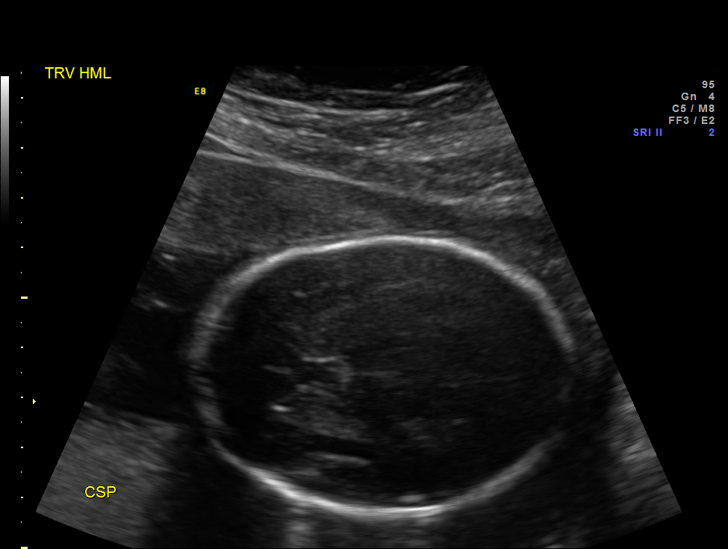
[im 21/46]
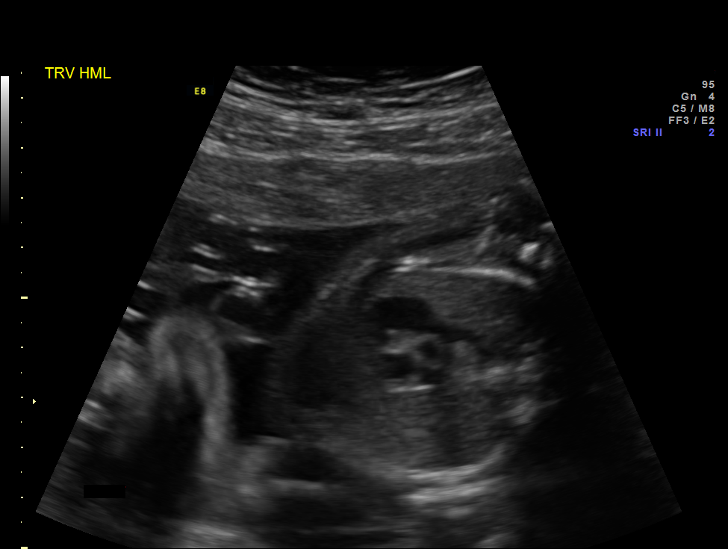
[im 26/46]
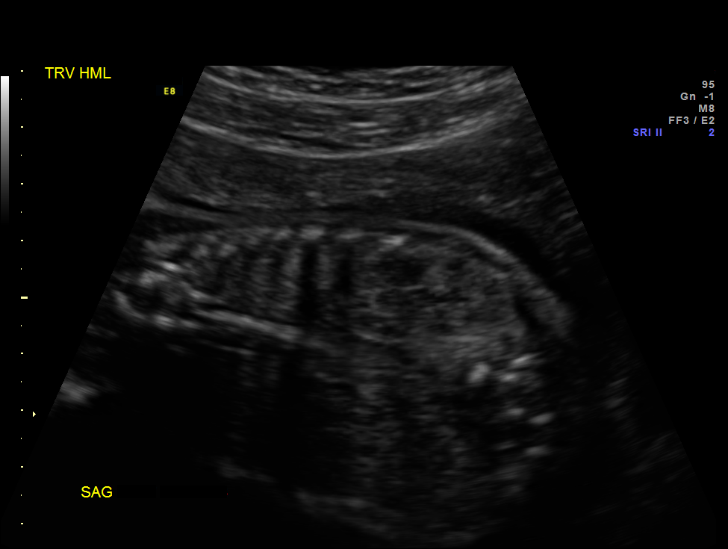
[im 29/46]
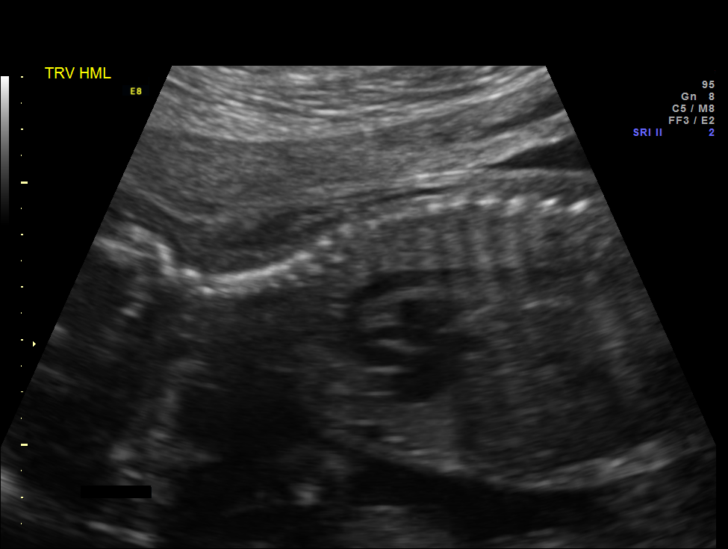
[im 32/46]
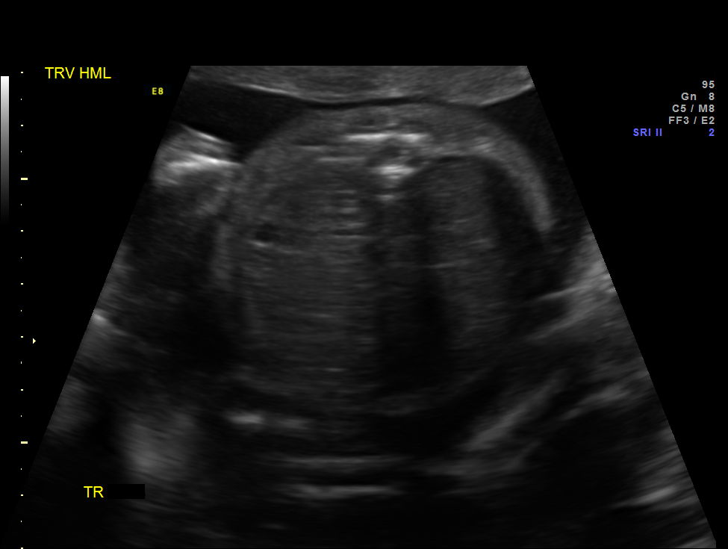
[im 37/46]
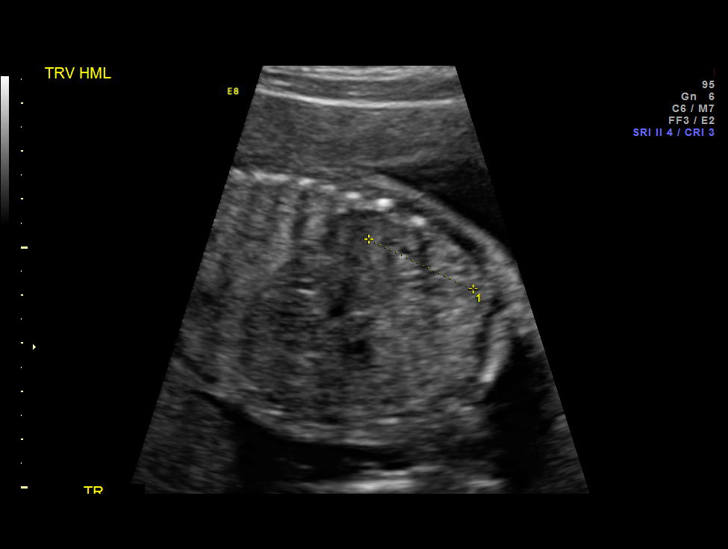
[im 41/46]
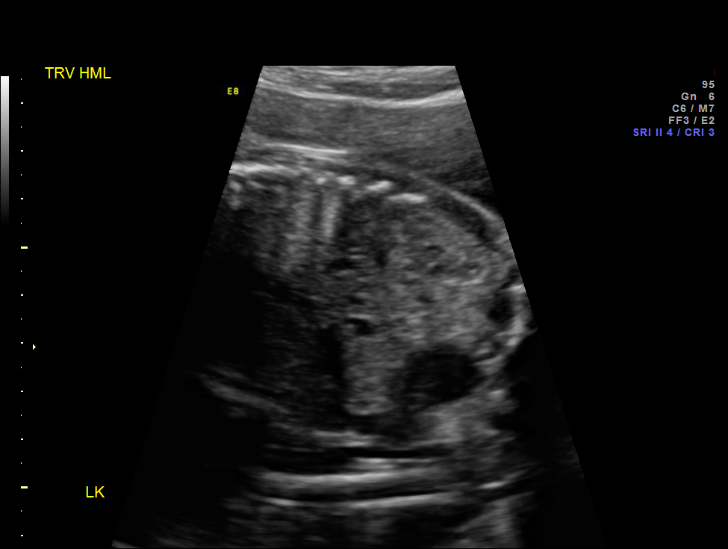
[im 44/46]
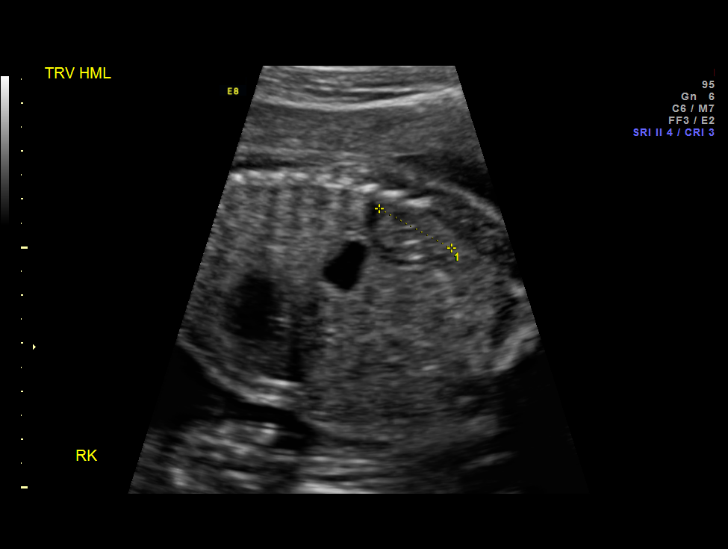

[12 of 28 positions shown; findings below may reference images not displayed]

OBSTETRICS REPORT
                      (Signed Final 12/31/2012 [DATE])

Service(s) Provided

 US OB FOLLOW UP                                       76816.1
Indications

 Advanced maternal age (AMA), Multigravida (36)
 Poor obstetric history: Previous preterm delivery
 (34 weeks)
 Poor obstetric history: Previous gestational
 diabetes
 Poor obstetric history: Previous preeclampsia (37
 week induction)
 Poor obstetric history: Previous fetal growth
 restriction (FGR)
 Maternal morbid obesity
 Hypertension - Chronic/Pre-existing - no
 medications
Fetal Evaluation

 Num Of Fetuses:    1
 Fetal Heart Rate:  141                          bpm
 Cardiac Activity:  Observed
 Presentation:      Transverse, head to
                    maternal left
 Placenta:          Posterior, above cervical
                    os
 P. Cord            Previously Visualized
 Insertion:

 Amniotic Fluid
 AFI FV:      Subjectively within normal limits
                                             Larg Pckt:    3.17  cm
 RUQ:   3.17    cm
Biometry

 BPD:     54.4  mm     G. Age:  22w 4d                CI:         72.7   70 - 86
 OFD:     74.8  mm                                    FL/HC:      20.3   19.2 -

 HC:     208.3  mm     G. Age:  23w 0d       27  %    HC/AC:      0.98   1.05 -

 AC:     211.7  mm     G. Age:  25w 5d     > 97  %    FL/BPD:     77.6   71 - 87
 FL:      42.2  mm     G. Age:  23w 5d       59  %    FL/AC:      19.9   20 - 24
 HUM:       41  mm     G. Age:  24w 6d       81  %
 Est. FW:     708  gm      1 lb 9 oz     75  %
Gestational Age

 LMP:           23w 1d        Date:  07/22/12                 EDD:   04/28/13
 U/S Today:     23w 5d                                        EDD:   04/24/13
 Best:          23w 1d     Det. By:  LMP  (07/22/12)          EDD:   04/28/13
Anatomy

 Cranium:          Appears normal         Aortic Arch:      Appears normal
 Fetal Cavum:      Appears normal         Ductal Arch:      Appears normal
 Ventricles:       Appears normal         Diaphragm:        Appears normal
 Choroid Plexus:   Appears normal         Stomach:          Appears normal
 Cerebellum:       Appears normal         Abdomen:          Appears normal
 Posterior Fossa:  Appears normal         Abdominal Wall:   Previously seen
 Nuchal Fold:      Previously seen        Cord Vessels:     Previously seen
 Face:             Orbits appear          Kidneys:          Appear normal
                   normal
 Lips:             Previously seen        Bladder:          Appears normal
 Heart:            Appears normal         Spine:            Previously seen
                   (4CH, axis, and
                   situs)
 RVOT:             Appears normal         Lower             Previously seen
                                          Extremities:
 LVOT:             Appears normal         Upper             Previously seen
                                          Extremities:

 Other:  Nasal bone previously visualized. Heels and 5th digit previously
         visualized. Female gender. Technically difficult due to maternal
         habitus and fetal position.
Cervix Uterus Adnexa

 Cervical Length:    3.27     cm

 Cervix:       Normal appearance by transabdominal scan.
 Uterus:       No abnormality visualized.

 Adnexa:     No abnormality visualized.
Impression

 IUP at 23+1 weeks
 Normal interval anatomy; anatomic survey complete
 Normal amniotic fluid volume
 Appropriate interval growth with EFW at the 75th %tle
Recommendations

 Follow-up ultrasound for growth in 6 weeks

 questions or concerns.

## 2015-03-06 ENCOUNTER — Ambulatory Visit (INDEPENDENT_AMBULATORY_CARE_PROVIDER_SITE_OTHER): Payer: Medicaid Other | Admitting: Obstetrics and Gynecology

## 2015-03-06 VITALS — BP 152/98 | HR 73 | Wt 293.0 lb

## 2015-03-06 DIAGNOSIS — Z3040 Encounter for surveillance of contraceptives, unspecified: Secondary | ICD-10-CM

## 2015-03-06 DIAGNOSIS — Z3042 Encounter for surveillance of injectable contraceptive: Secondary | ICD-10-CM

## 2015-03-06 MED ORDER — MEDROXYPROGESTERONE ACETATE 104 MG/0.65ML ~~LOC~~ SUSP
104.0000 mg | Freq: Once | SUBCUTANEOUS | Status: AC
  Administered 2015-03-06: 104 mg via SUBCUTANEOUS

## 2015-03-09 ENCOUNTER — Encounter: Payer: Self-pay | Admitting: Obstetrics and Gynecology

## 2015-03-09 ENCOUNTER — Ambulatory Visit (INDEPENDENT_AMBULATORY_CARE_PROVIDER_SITE_OTHER): Payer: Medicaid Other | Admitting: Obstetrics and Gynecology

## 2015-03-09 ENCOUNTER — Telehealth: Payer: Self-pay | Admitting: Obstetrics and Gynecology

## 2015-03-09 VITALS — BP 138/84 | HR 92 | Temp 98.8°F | Ht 72.0 in | Wt 290.0 lb

## 2015-03-09 DIAGNOSIS — E7800 Pure hypercholesterolemia, unspecified: Secondary | ICD-10-CM

## 2015-03-09 DIAGNOSIS — Z720 Tobacco use: Secondary | ICD-10-CM | POA: Diagnosis not present

## 2015-03-09 DIAGNOSIS — E119 Type 2 diabetes mellitus without complications: Secondary | ICD-10-CM | POA: Diagnosis not present

## 2015-03-09 DIAGNOSIS — E785 Hyperlipidemia, unspecified: Secondary | ICD-10-CM | POA: Diagnosis not present

## 2015-03-09 DIAGNOSIS — I10 Essential (primary) hypertension: Secondary | ICD-10-CM | POA: Diagnosis not present

## 2015-03-09 DIAGNOSIS — R7309 Other abnormal glucose: Secondary | ICD-10-CM

## 2015-03-09 DIAGNOSIS — B373 Candidiasis of vulva and vagina: Secondary | ICD-10-CM | POA: Diagnosis not present

## 2015-03-09 DIAGNOSIS — F17200 Nicotine dependence, unspecified, uncomplicated: Secondary | ICD-10-CM

## 2015-03-09 DIAGNOSIS — B3731 Acute candidiasis of vulva and vagina: Secondary | ICD-10-CM | POA: Insufficient documentation

## 2015-03-09 LAB — GLUCOSE, CAPILLARY: GLUCOSE-CAPILLARY: 411 mg/dL — AB (ref 70–99)

## 2015-03-09 LAB — COMPREHENSIVE METABOLIC PANEL
ALT: 18 U/L (ref 0–35)
AST: 16 U/L (ref 0–37)
Albumin: 4 g/dL (ref 3.5–5.2)
Alkaline Phosphatase: 127 U/L — ABNORMAL HIGH (ref 39–117)
BUN: 8 mg/dL (ref 6–23)
CO2: 21 mEq/L (ref 19–32)
CREATININE: 0.91 mg/dL (ref 0.50–1.10)
Calcium: 9.4 mg/dL (ref 8.4–10.5)
Chloride: 100 mEq/L (ref 96–112)
Glucose, Bld: 425 mg/dL — ABNORMAL HIGH (ref 70–99)
Potassium: 3.9 mEq/L (ref 3.5–5.3)
Sodium: 134 mEq/L — ABNORMAL LOW (ref 135–145)
Total Bilirubin: 0.4 mg/dL (ref 0.2–1.2)
Total Protein: 7.3 g/dL (ref 6.0–8.3)

## 2015-03-09 LAB — POCT UA - MICROSCOPIC ONLY

## 2015-03-09 LAB — LIPID PANEL
CHOL/HDL RATIO: 6.4 ratio
Cholesterol: 255 mg/dL — ABNORMAL HIGH (ref 0–200)
HDL: 40 mg/dL — ABNORMAL LOW (ref >=46)
LDL CALC: 156 mg/dL — AB (ref 0–99)
Triglycerides: 297 mg/dL — ABNORMAL HIGH (ref <150)
VLDL: 59 mg/dL — AB (ref 0–40)

## 2015-03-09 LAB — POCT URINALYSIS DIPSTICK
Bilirubin, UA: NEGATIVE
Glucose, UA: 500
Ketones, UA: 15
Leukocytes, UA: NEGATIVE
Nitrite, UA: NEGATIVE
PH UA: 6
PROTEIN UA: 100
Spec Grav, UA: 1.01
UROBILINOGEN UA: 0.2

## 2015-03-09 LAB — POCT GLYCOSYLATED HEMOGLOBIN (HGB A1C): Hemoglobin A1C: 13.7

## 2015-03-09 MED ORDER — ACCU-CHEK FASTCLIX LANCETS MISC: 1.0000 | Freq: Three times a day (TID) | Status: DC

## 2015-03-09 MED ORDER — GLUCOSE BLOOD VI STRP
ORAL_STRIP | Status: DC
Start: 2015-03-09 — End: 2016-04-08

## 2015-03-09 MED ORDER — INSULIN PEN NEEDLE 31G X 5 MM MISC: Status: DC

## 2015-03-09 MED ORDER — SIMVASTATIN 40 MG PO TABS: 40.0000 mg | ORAL_TABLET | Freq: Every day | ORAL | Status: DC

## 2015-03-09 MED ORDER — INSULIN GLARGINE 100 UNIT/ML SOLOSTAR PEN
PEN_INJECTOR | SUBCUTANEOUS | Status: DC
Start: 2015-03-09 — End: 2015-03-16

## 2015-03-09 MED ORDER — NYSTATIN 100000 UNIT/GM EX POWD: CUTANEOUS | Status: DC

## 2015-03-09 MED ORDER — ACCU-CHEK NANO SMARTVIEW W/DEVICE KIT: 1.0000 | PACK | Freq: Three times a day (TID) | Status: AC

## 2015-03-09 MED ORDER — FLUCONAZOLE 150 MG PO TABS: ORAL_TABLET | ORAL | Status: DC

## 2015-03-09 MED ORDER — METFORMIN HCL 500 MG PO TABS: 500.0000 mg | ORAL_TABLET | Freq: Every day | ORAL | Status: DC

## 2015-03-09 NOTE — Patient Instructions (Addendum)
Darlene Bennett it was great to see you today!  I am pleased to hear that things are going well for you.  Here are some of the things we discussed today: -Diabetes = New medications is metformin take this once a day. Take 10U of Lantus at ebdtime daily. Increase by 5 units if sugars are greater than 200. Please monitor your sugars at least 4 times a day with meals before bed and in the mornings. Keep a log of your sugars so we can adjust her medications as needed. -For your cholesterol surgery on a statin medication. Wheeze take once daily -I will call you about the results of your blood work -For your rash = please take the fluconazole pill once every 3 days for 3 doses. I also prescribed you a powder to use at least 3 times a day. Please try to keep area as dry as possible to help with healing. -It is important that she stay hydrated.  New medications: Please see medication list  Please schedule a follow-up appointment for 2 weeks for me.    Thanks for allowing me to be a part of your care! Dr. Gerarda Fraction    Type 2 Diabetes Mellitus Type 2 diabetes mellitus is a long-term (chronic) disease. In type 2 diabetes:  The pancreas does not make enough of a hormone called insulin.  The cells in the body do not respond as well to the insulin that is made.  Both of the above can happen. Normally, insulin moves sugars from food into tissue cells. This gives you energy. If you have type 2 diabetes, sugars cannot be moved into tissue cells. This causes high blood sugar (hyperglycemia).  HOME CARE  Have your hemoglobin A1c level checked twice a year. The level shows if your diabetes is under control or out of control.  Test your blood sugar level every day as told by your doctor.  Check your ketone levels by testing your pee (urine) when you are sick and as told.  Take your diabetes or insulin medicine as told by your doctor.  Never run out of insulin.  Adjust how much insulin you give yourself  based on how many carbs (carbohydrates) you eat. Carbs are in many foods, such as fruits, vegetables, whole grains, and dairy products.  Have a healthy snack between every healthy meal. Have 3 meals and 3 snacks a day.  Lose weight if you are overweight.  Carry a medical alert card or wear your medical alert jewelry.  Carry a 15-gram carb snack with you at all times. Examples include:  Glucose pills, 3 or 4.  Glucose gel, 15-gram tube.  Raisins, 2 tablespoons (24 grams).  Jelly beans, 6.  Animal crackers, 8.  Regular (not diet) pop, 4 ounces (120 milliliters).  Gummy treats, 9.  Notice low blood sugar (hypoglycemia) symptoms, such as:  Shaking (tremors).  Trouble thinking clearly.  Sweating.  Faster heart rate.  Headache.  Dry mouth.  Hunger.  Crabbiness (irritability).  Being worried or tense (anxious).  Restless sleep.  A change in speech or coordination.  Confusion.  Treat low blood sugar right away. If you are alert and can swallow, follow the 15:15 rule:  Take 15-20 grams of a rapid-acting glucose or carb. This includes glucose gel, glucose pills, or 4 ounces (120 milliliters) of fruit juice, regular pop, or low-fat milk.  Check your blood sugar level 15 minutes after taking the glucose.  Take 15-20 grams more of glucose if the repeat blood sugar  level is still 70 mg/dL (milligrams/deciliter) or below.  Eat a meal or snack within 1 hour of the blood sugar levels going back to normal.  Notice early symptoms of high blood sugar, such as:  Being really thirsty or drinking a lot (polydipsia).  Peeing a lot (polyuria).  Do at least 150 minutes of physical activity a week or as told.  Split the 150 minutes of activity up during the week. Do not do 150 minutes of activity in one day.  Perform exercises, such as weight lifting, at least 2 times a week or as told.  Spend no more than 90 minutes at one time inactive.  Adjust your insulin or food  intake as needed if you start a new exercise or sport.  Follow your sick-day plan when you are not able to eat or drink as usual.  Do not smoke, chew tobacco, or use electronic cigarettes.  Women who are not pregnant should drink no more than 1 drink a day. Men should drink no more than 2 drinks a day.  Only drink alcohol with food.  Ask your doctor if alcohol is safe for you.  Tell your doctor if you drink alcohol several times during the week.  See your doctor regularly.  Schedule an eye exam soon after you are told you have diabetes. Schedule exams once every year.  Check your skin and feet every day. Check for cuts, bruises, redness, nail problems, bleeding, blisters, or sores. A doctor should do a foot exam once a year.  Brush your teeth and gums twice a day. Floss once a day. Visit your dentist regularly.  Share your diabetes plan with your workplace or school.  Stay up-to-date with shots that fight against diseases (immunizations).  Learn how to deal with stress.  Get diabetes education and support as needed.  Ask your doctor for special help if:  You need help to maintain or improve how you do things on your own.  You need help to maintain or improve the quality of your life.  You have foot or hand problems.  You have trouble cleaning yourself, dressing, eating, or doing physical activity. GET HELP IF:  You are unable to eat or drink for more than 6 hours.  You feel sick to your stomach (nauseous) or throw up (vomit) for more than 6 hours.  Your blood sugar level is over 240 mg/dL.  There is a change in mental status.  You get another serious illness.  You have watery poop (diarrhea) for more than 6 hours.  You have been sick or have had a fever for 2 or more days and are not getting better.  You have pain when you are active. GET HELP RIGHT AWAY IF:  You have trouble breathing.  Your ketone levels are higher than your doctor says they should  be. MAKE SURE YOU:  Understand these instructions.  Will watch your condition.  Will get help right away if you are not doing well or get worse. Document Released: 08/30/2008 Document Revised: 04/07/2014 Document Reviewed: 06/22/2012 [facility] Patient Information 2015 [place], [place]. This information is not intended to replace advice given to you by your health care provider. Make sure you discuss any questions you have with your health care provider.

## 2015-03-09 NOTE — Telephone Encounter (Signed)
Pt was prescribed smart-vue glucose meter and supplies, medicaid only covers accu-chek aviva plus, needs verbal ok to change

## 2015-03-09 NOTE — Assessment & Plan Note (Signed)
A: Current every day smoker. Smokes 1/2 pack per day. P: Patient counseled on tobacco cessation.

## 2015-03-09 NOTE — Assessment & Plan Note (Signed)
A: Last lipid panel suggestive of hyperlipidemia. Patient was never treated with medication P: Reordered a lipid panel. Patient started on Simvastatin. Patient counseled on diet and exercise.

## 2015-03-09 NOTE — Progress Notes (Signed)
Subjective: Chief Complaint  Patient presents with  . Establish Care  . Annual Exam    HPI: Darlene Bennett is a 39 y.o. presenting to clinic today to discuss the following:  #Rash -about 4 days but could have been there longer -noticed it when washing and soap made it burn  -raw to touch -located in between her legs. Patient states she is unable to see area.  -boyfriend described rash as white and raw looking  -has been applying Vaseline to the area with no improvement -Never had anything like this before -Denies discharge, burning -Some pain and soreness when touched  #?Diabetes: -when pregnanat with last daughter had gestaional diabetes -6 week check up after birth sugars were normal but told could be borderline  -No medicine given at that time -states she also had an A1c check that was recorded as 6.9 -patient started having symptoms such as blurred vision and polyuria, polydipsia. -her family has diabetes so she was aware of the symptoms -checked her sugar with mother's glucose meter and it was 525 -patient does not exercise and endorses a poor diet; says she craves sweets ROS: denies fever, chills, dizziness, LOC, numbness or tingling in extremities or chest pain. Never had an eye exam  Establish care: -previously only seen at [facility] -they give patient her Depo shots  ROS reviewed and were negative unless otherwise noted in HPI. Pertinently, no chest pain, palpitations, SOB, Fever, Chills, Abd pain, N/V/D.  Past Medical, Surgical, Social, and Family History Reviewed & Updated per EMR.   Objective: BP 138/84 mmHg  Pulse 92  Temp(Src) 98.8 F (37.1 C) (Oral)  Ht 6' (1.829 m)  Wt 290 lb (131.543 kg)  BMI 39.32 kg/m2  Breastfeeding? No  Physical Exam  Constitutional: She is oriented to person, place, and time and well-developed, well-nourished, and in no distress.  Obese AA female  Eyes: EOM are normal. Pupils are equal, round, and reactive to light.  Scleral icterus is present.  Neck: Normal range of motion. Neck supple. No thyromegaly present.  Cardiovascular: Normal rate, regular rhythm, normal heart sounds and intact distal pulses.   Pulmonary/Chest: Effort normal and breath sounds normal. She has no wheezes.  Abdominal: Soft. Bowel sounds are normal. She exhibits no mass. There is no tenderness.  Genitourinary: Vulva exhibits rash and tenderness. No vaginal discharge found.  Extensive vulvo-cutaneous candidiasis with maceration and fissuring. The area involved has a scalloped border with a white rim that surrounds the erythematous macerated base. Satellite lesions present. Area extends from mons pubis to gluteal cleft.  Musculoskeletal: Normal range of motion. She exhibits no edema.  Lymphadenopathy:    She has no cervical adenopathy.  Neurological: She is alert and oriented to person, place, and time. She has normal sensation and normal strength. Gait normal.  Skin: Skin is warm and dry. Rash noted.    Diabetic Foot Exam - Simple   Simple Foot Form  Diabetic Foot exam was performed with the following findings:  Yes 03/09/2015 11:35 AM  Visual Inspection  Sensation Testing  Pulse Check  Comments  Thickened skin appreciated on bilateral feet. Decreased sensation on heels.     Results for orders placed or performed in visit on 03/09/15 (from the past 72 hour(s))  POCT A1C     Status: Abnormal   Collection Time: 03/09/15 10:10 AM  Result Value Ref Range   Hemoglobin A1C 13.7   POCT urinalysis dipstick     Status: Abnormal   Collection Time:  03/09/15 10:10 AM  Result Value Ref Range   Color, UA YELLOW    Clarity, UA CLEAR    Glucose, UA 500    Bilirubin, UA NEG    Ketones, UA 15    Spec Grav, UA 1.010    Blood, UA SMALL    pH, UA 6.0    Protein, UA 100    Urobilinogen, UA 0.2    Nitrite, UA NEG    Leukocytes, UA Negative   Glucose, capillary     Status: Abnormal   Collection Time: 03/09/15 10:14 AM  Result Value Ref  Range   Glucose-Capillary 411 (H) 70 - 99 mg/dL   Comment 1 FASTING    Comment 2 Notify RN    Comment 3 Call MD NNP PA CNM   POCT UA - Microscopic Only     Status: Abnormal   Collection Time: 03/09/15 10:15 AM  Result Value Ref Range   WBC, Ur, HPF, POC 1-5    RBC, urine, microscopic RARE    Bacteria, U Microscopic 2+    Epithelial cells, urine per micros 1-5    Yeast, UA MANY     Assessment/Plan: Please see problem based Assessment and Plan   Orders Placed This Encounter  Procedures  . Glucose, capillary  . Lipid Panel    Order Specific Question:  Has the patient fasted?    Answer:  No  . Comprehensive metabolic panel  . POCT A1C  . Glucose (CBG)  . POCT urinalysis dipstick  . POCT UA - Microscopic Only    Meds ordered this encounter  Medications  . fluconazole (DIFLUCAN) 150 MG tablet    Sig: Take one pill every 72 hours (every 3 days) for 3 doses.    Dispense:  3 tablet    Refill:  0  . metFORMIN (GLUCOPHAGE) 500 MG tablet    Sig: Take 1 tablet (500 mg total) by mouth daily with breakfast.    Dispense:  180 tablet    Refill:  0  . Insulin Glargine (LANTUS SOLOSTAR) 100 UNIT/ML Solostar Pen    Sig: Give 10U of Lantus subcutaneously at bedtime. Increase Lantus by 5U if sugars >200.    Dispense:  15 mL    Refill:  2    3 ml per pen. 5 pens per box. Please dispense 1 box of pens. For questions regarding this prescription please call [phone].    Order Specific Question:  Lot Number?    Answer:  [id number]    Order Specific Question:  Expiration Date?    Answer:  08/04/2016    Order Specific Question:  NDC    Answer:  2376-2831-51 [[id number]]    Order Specific Question:  Quantity    Answer:  2  . Blood Glucose Monitoring Suppl (ACCU-CHEK NANO SMARTVIEW) W/DEVICE KIT    Sig: 1 Device by Does not apply route 4 (four) times daily -  before meals and at bedtime.    Dispense:  1 kit    Refill:  0  . glucose blood (ACCU-CHEK SMARTVIEW) test strip    Sig: Use as  instructed    Dispense:  100 each    Refill:  12  . Insulin Pen Needle (B-D UF III MINI PEN NEEDLES) 31G X 5 MM MISC    Sig: Use with Lantus pens.    Dispense:  6 each    Refill:  2  . ACCU-CHEK FASTCLIX LANCETS MISC    Sig: 1 each by Does not apply route 4 (  four) times daily -  before meals and at bedtime. Check sugar 6 x daily    Dispense:  204 each    Refill:  3    Lancets come in boxes of 102 each. Please dispense 2 boxes. For questions regarding this prescription please call [phone].  . nystatin (MYCOSTATIN/NYSTOP) 100000 UNIT/GM POWD    Sig: Apply to infected area three times daily.    Dispense:  2 Bottle    Refill:  2  . simvastatin (ZOCOR) 40 MG tablet    Sig: Take 1 tablet (40 mg total) by mouth at bedtime.    Dispense:  90 tablet    Refill:  3    The total time of the encounter was >45 minutes. Time spent gathering history, counseling, coordinating care, and disease management education.    Luiz Blare, DO 03/09/2015, 11:33 AM PGY-1, [facility]

## 2015-03-09 NOTE — Telephone Encounter (Signed)
Verbal ok given to crystal ok per Dr. Gerarda Fraction. Jefferson Fullam,CMA

## 2015-03-09 NOTE — Telephone Encounter (Signed)
Spoke with pt and she stated that smart vue glucometer was not covered by insurance but informed her that pharmacy called earlier today to ask for verbal order to change glucometer to what insurance would cover (accu chek aviva plus) and verbal was given. Contacted pharmacy again to confirm the change and advised pt that it was changed to what insurance would cover. Adanya Sosinski, CMA.

## 2015-03-09 NOTE — Addendum Note (Signed)
Addended by: Katheren Shams on: 03/09/2015 05:19 PM   Modules accepted: Level of Service

## 2015-03-09 NOTE — Assessment & Plan Note (Addendum)
A: New-onset T2DM with polyuria, polydipsia,a nd blurry vision. Currently uncontrolled. Patient with history of gestational diabetes that was treated with insulin. After pregnancy was not on any medications. A1c today 13.7 and CBG 411.  P: Rx given for metformin 500mg  daily and Lantus 10u at bedtime. Patient given instructions to go up on Lantus 5U if she notices that her CBGs remain >200. She is to monitor sugars at least 4 times daily. Patient given extensive counseling and education on management of her diabetes. She will RTC in 2 weeks and has a pharmacy clinic appointment in 1 week.

## 2015-03-09 NOTE — Progress Notes (Signed)
One of the available preceptor.

## 2015-03-09 NOTE — Assessment & Plan Note (Addendum)
A: Severe, extensive cutaneous-vulvo candidiasis that extends from mons pubis to anal cleft. Presumptive etiology is from uncontrolled diabetes.  P: Patient given Fluconazole tablet to take every 72 hrs for 3 doses. Also Rx for Nystatin powder. She was instructed to keep area clean and dry.

## 2015-03-09 NOTE — Assessment & Plan Note (Addendum)
Initially elevated but on recheck wnl. Will continue to monitor. No medication adjustment at this time. Continue HCTZ. CMP ordered.

## 2015-03-09 NOTE — Telephone Encounter (Signed)
Rx that was prescribed at pt's appt today is not covered by [facility] and she cannot afford to pay for it. Please contact the pt to discuss other options Darlene Bennett, ASA

## 2015-03-10 ENCOUNTER — Emergency Department (HOSPITAL_COMMUNITY)
Admission: EM | Admit: 2015-03-10 | Discharge: 2015-03-10 | Disposition: A | Discharge status: Home / Self Care | Payer: Medicaid Other | Attending: Emergency Medicine | Admitting: Emergency Medicine

## 2015-03-10 ENCOUNTER — Encounter (HOSPITAL_COMMUNITY): Payer: Self-pay

## 2015-03-10 ENCOUNTER — Encounter: Payer: Self-pay | Admitting: Obstetrics and Gynecology

## 2015-03-10 DIAGNOSIS — Z720 Tobacco use: Secondary | ICD-10-CM | POA: Diagnosis not present

## 2015-03-10 DIAGNOSIS — I10 Essential (primary) hypertension: Secondary | ICD-10-CM | POA: Diagnosis not present

## 2015-03-10 DIAGNOSIS — R739 Hyperglycemia, unspecified: Secondary | ICD-10-CM | POA: Insufficient documentation

## 2015-03-10 DIAGNOSIS — Z79899 Other long term (current) drug therapy: Secondary | ICD-10-CM | POA: Insufficient documentation

## 2015-03-10 DIAGNOSIS — Z794 Long term (current) use of insulin: Secondary | ICD-10-CM | POA: Insufficient documentation

## 2015-03-10 DIAGNOSIS — Z3202 Encounter for pregnancy test, result negative: Secondary | ICD-10-CM | POA: Diagnosis not present

## 2015-03-10 DIAGNOSIS — Z8632 Personal history of gestational diabetes: Secondary | ICD-10-CM | POA: Diagnosis not present

## 2015-03-10 LAB — URINALYSIS, ROUTINE W REFLEX MICROSCOPIC
BILIRUBIN URINE: NEGATIVE
Glucose, UA: >1000 mg/dL — AB
KETONES UR: 15 mg/dL — AB
LEUKOCYTES UA: NEGATIVE
NITRITE: NEGATIVE
PH: 6 (ref 5.0–8.0)
PROTEIN: 30 mg/dL — AB
Specific Gravity, Urine: 1.043 — ABNORMAL HIGH (ref 1.005–1.030)
Urobilinogen, UA: 0.2 mg/dL (ref 0.0–1.0)

## 2015-03-10 LAB — BASIC METABOLIC PANEL
Anion gap: 10 (ref 5–15)
BUN: 6 mg/dL (ref 6–23)
CO2: 22 mmol/L (ref 19–32)
Calcium: 8.6 mg/dL (ref 8.4–10.5)
Chloride: 102 mmol/L (ref 96–112)
Creatinine, Ser: 0.99 mg/dL (ref 0.50–1.10)
GFR calc Af Amer: 82 mL/min — ABNORMAL LOW (ref >90)
GFR, EST NON AFRICAN AMERICAN: 71 mL/min — AB (ref >90)
Glucose, Bld: 384 mg/dL — ABNORMAL HIGH (ref 70–99)
Potassium: 3.8 mmol/L (ref 3.5–5.1)
Sodium: 134 mmol/L — ABNORMAL LOW (ref 135–145)

## 2015-03-10 LAB — CBC
HEMATOCRIT: 39.8 % (ref 36.0–46.0)
Hemoglobin: 14.3 g/dL (ref 12.0–15.0)
MCH: 29.4 pg (ref 26.0–34.0)
MCHC: 35.9 g/dL (ref 30.0–36.0)
MCV: 81.9 fL (ref 78.0–100.0)
Platelets: 291 10*3/uL (ref 150–400)
RBC: 4.86 MIL/uL (ref 3.87–5.11)
RDW: 12.4 % (ref 11.5–15.5)
WBC: 7.5 10*3/uL (ref 4.0–10.5)

## 2015-03-10 LAB — COMPREHENSIVE METABOLIC PANEL
ALK PHOS: 133 U/L — AB (ref 39–117)
ALT: 23 U/L (ref 0–35)
AST: 23 U/L (ref 0–37)
Albumin: 3.5 g/dL (ref 3.5–5.2)
Anion gap: 13 (ref 5–15)
BUN: 7 mg/dL (ref 6–23)
CO2: 16 mmol/L — AB (ref 19–32)
Calcium: 9.2 mg/dL (ref 8.4–10.5)
Chloride: 101 mmol/L (ref 96–112)
Creatinine, Ser: 0.97 mg/dL (ref 0.50–1.10)
GFR calc Af Amer: 84 mL/min — ABNORMAL LOW (ref >90)
GFR, EST NON AFRICAN AMERICAN: 73 mL/min — AB (ref >90)
Glucose, Bld: 495 mg/dL — ABNORMAL HIGH (ref 70–99)
POTASSIUM: 3.7 mmol/L (ref 3.5–5.1)
SODIUM: 130 mmol/L — AB (ref 135–145)
Total Bilirubin: 0.3 mg/dL (ref 0.3–1.2)
Total Protein: 7.3 g/dL (ref 6.0–8.3)

## 2015-03-10 LAB — I-STAT VENOUS BLOOD GAS, ED
ACID-BASE DEFICIT: 2 mmol/L (ref 0.0–2.0)
BICARBONATE: 22.5 meq/L (ref 20.0–24.0)
O2 Saturation: 42 %
TCO2: 24 mmol/L (ref 0–100)
pCO2, Ven: 37.7 mmHg — ABNORMAL LOW (ref 45.0–50.0)
pH, Ven: 7.383 — ABNORMAL HIGH (ref 7.250–7.300)
pO2, Ven: 24 mmHg — CL (ref 30.0–45.0)

## 2015-03-10 LAB — URINE MICROSCOPIC-ADD ON

## 2015-03-10 LAB — MICROALBUMIN, URINE: MICROALB UR: 29 mg/dL — AB (ref <2.0)

## 2015-03-10 LAB — POC URINE PREG, ED: PREG TEST UR: NEGATIVE

## 2015-03-10 LAB — CBG MONITORING, ED
GLUCOSE-CAPILLARY: 492 mg/dL — AB (ref 70–99)
GLUCOSE-CAPILLARY: 371 mg/dL — AB (ref 70–99)
GLUCOSE-CAPILLARY: 318 mg/dL — AB (ref 70–99)

## 2015-03-10 LAB — I-STAT CG4 LACTIC ACID, ED: Lactic Acid, Venous: 1.58 mmol/L (ref 0.5–2.0)

## 2015-03-10 MED ORDER — SODIUM CHLORIDE 0.9 % IV BOLUS (SEPSIS)
1000.0000 mL | Freq: Once | INTRAVENOUS | Status: AC
  Administered 2015-03-10: 1000 mL via INTRAVENOUS

## 2015-03-10 NOTE — Discharge Instructions (Signed)
Thank you for allowing [place] to take care of you today.  Please increase your lantus to 15 units daily and follow up with your primary care doctor in two days.  Hyperglycemia Hyperglycemia occurs when the glucose (sugar) in your blood is too high. Hyperglycemia can happen for many reasons, but it most often happens to people who do not know they have diabetes or are not managing their diabetes properly.  CAUSES  Whether you have diabetes or not, there are other causes of hyperglycemia. Hyperglycemia can occur when you have diabetes, but it can also occur in other situations that you might not be as aware of, such as: Diabetes  If you have diabetes and are having problems controlling your blood glucose, hyperglycemia could occur because of some of the following reasons:  Not following your meal plan.  Not taking your diabetes medications or not taking it properly.  Exercising less or doing less activity than you normally do.  Being sick. Pre-diabetes  This cannot be ignored. Before people develop Type 2 diabetes, they almost always have "pre-diabetes." This is when your blood glucose levels are higher than normal, but not yet high enough to be diagnosed as diabetes. Research has shown that some long-term damage to the body, especially the heart and circulatory system, may already be occurring during pre-diabetes. If you take action to manage your blood glucose when you have pre-diabetes, you may delay or prevent Type 2 diabetes from developing. Stress  If you have diabetes, you may be "diet" controlled or on oral medications or insulin to control your diabetes. However, you may find that your blood glucose is higher than usual in the hospital whether you have diabetes or not. This is often referred to as "stress hyperglycemia." Stress can elevate your blood glucose. This happens because of hormones put out by the body during times of stress. If stress has been the cause of your high blood  glucose, it can be followed regularly by your caregiver. That way he/she can make sure your hyperglycemia does not continue to get worse or progress to diabetes. Steroids  Steroids are medications that act on the infection fighting system (immune system) to block inflammation or infection. One side effect can be a rise in blood glucose. Most people can produce enough extra insulin to allow for this rise, but for those who cannot, steroids make blood glucose levels go even higher. It is not unusual for steroid treatments to "uncover" diabetes that is developing. It is not always possible to determine if the hyperglycemia will go away after the steroids are stopped. A special blood test called an A1c is sometimes done to determine if your blood glucose was elevated before the steroids were started. SYMPTOMS  Thirsty.  Frequent urination.  Dry mouth.  Blurred vision.  Tired or fatigue.  Weakness.  Sleepy.  Tingling in feet or leg. DIAGNOSIS  Diagnosis is made by monitoring blood glucose in one or all of the following ways:  A1c test. This is a chemical found in your blood.  Fingerstick blood glucose monitoring.  Laboratory results. TREATMENT  First, knowing the cause of the hyperglycemia is important before the hyperglycemia can be treated. Treatment may include, but is not be limited to:  Education.  Change or adjustment in medications.  Change or adjustment in meal plan.  Treatment for an illness, infection, etc.  More frequent blood glucose monitoring.  Change in exercise plan.  Decreasing or stopping steroids.  Lifestyle changes. HOME CARE INSTRUCTIONS  Test your blood glucose as directed.  Exercise regularly. Your caregiver will give you instructions about exercise. Pre-diabetes or diabetes which comes on with stress is helped by exercising.  Eat wholesome, balanced meals. Eat often and at regular, fixed times. Your caregiver or nutritionist will give you a  meal plan to guide your sugar intake.  Being at an ideal weight is important. If needed, losing as little as 10 to 15 pounds may help improve blood glucose levels. SEEK MEDICAL CARE IF:   You have questions about medicine, activity, or diet.  You continue to have symptoms (problems such as increased thirst, urination, or weight gain). SEEK IMMEDIATE MEDICAL CARE IF:   You are vomiting or have diarrhea.  Your breath smells fruity.  You are breathing faster or slower.  You are very sleepy or incoherent.  You have numbness, tingling, or pain in your feet or hands.  You have chest pain.  Your symptoms get worse even though you have been following your caregiver's orders.  If you have any other questions or concerns. Document Released: 05/17/2001 Document Revised: 02/13/2012 Document Reviewed: 03/19/2012 [facility] Patient Information 2015 [place], [place]. This information is not intended to replace advice given to you by your health care provider. Make sure you discuss any questions you have with your health care provider.

## 2015-03-10 NOTE — ED Notes (Signed)
Pt. Left with all belongings and refused wheelchair 

## 2015-03-10 NOTE — ED Provider Notes (Signed)
CSN: [id number]     Arrival date & time 03/10/15  1416 History   First MD Initiated Contact with Patient 03/10/15 1604     Chief Complaint  Patient presents with  . Hyperglycemia     (Consider location/radiation/quality/duration/timing/severity/associated sxs/prior Treatment) HPI   A 39 year old female with past medical history of hypertension, type 2 diabetes diagnosed yesterday by her primary care physician. She was having some polydipsia, polyuria fingerstick glucose at home a couple days ago was greater than 500. Went to the PCPs office yesterday where glucose was in the 400s.   She was started on metformin as well as Lantus 10 units daily yesterday. She has had 1 dose of the metformin this morning as well as 1 dose of Lantus. She took her glucose today was elevated greater than 500 so she decided to come to the emergency department. She denies any recent illnesses, fever, abdominal pain, chest pain, shortness of breath, cough. She is also diagnosed with a candidal rash yesterday at her PCPs office which is being followed by them. No changes in that since yesterday and she was placed on treatment for it.   Past Medical History  Diagnosis Date  . History of gestational diabetes in prior pregnancy, currently pregnant   . Gestational diabetes mellitus, antepartum 02/14/2013  . Cholelithiasis   . Gestational diabetes     insulin  . Hypertension     preeclampsia with previous pregnancy   Past Surgical History  Procedure Laterality Date  . Tubal ligation      2007   Family History  Problem Relation Age of Onset  . Heart disease Mother   . Hypertension Mother   . Diabetes Mother   . Hypertension Father   . Diabetes Father    History  Substance Use Topics  . Smoking status: Current Every Day Smoker -- 0.50 packs/day    Types: Cigarettes  . Smokeless tobacco: Never Used  . Alcohol Use: No   OB History    Gravida Para Term Preterm AB TAB SAB Ectopic Multiple Living   '4 4 3 1  '$ 0 0 0 0 0 4     Review of Systems  Constitutional: Negative for fever and chills.  HENT: Negative for nosebleeds.   Eyes: Negative for visual disturbance.  Respiratory: Negative for cough and shortness of breath.   Cardiovascular: Negative for chest pain.  Gastrointestinal: Negative for nausea, vomiting, abdominal pain, diarrhea and constipation.  Genitourinary: Negative for dysuria.  Neurological: Negative for weakness.  All other systems reviewed and are negative.     Allergies  Review of patient's allergies indicates no known allergies.  Home Medications   Prior to Admission medications   Medication Sig Start Date End Date Taking? Authorizing Provider  ACCU-CHEK FASTCLIX LANCETS MISC 1 each by Does not apply route 4 (four) times daily -  before meals and at bedtime. Check sugar 6 x daily 03/09/15   Katheren Shams, DO  Blood Glucose Monitoring Suppl (ACCU-CHEK NANO SMARTVIEW) W/DEVICE KIT 1 Device by Does not apply route 4 (four) times daily -  before meals and at bedtime. 03/09/15   Katheren Shams, DO  fluconazole (DIFLUCAN) 150 MG tablet Take one pill every 72 hours (every 3 days) for 3 doses. 03/09/15   Katheren Shams, DO  glucose blood (ACCU-CHEK SMARTVIEW) test strip Use as instructed 03/09/15   Katheren Shams, DO  hydrochlorothiazide (HYDRODIURIL) 25 MG tablet Take 1 tablet (25 mg total) by mouth daily. 10/17/14  Tanna Savoy Stinson, DO  ibuprofen (ADVIL,MOTRIN) 600 MG tablet Take 1 tablet (600 mg total) by mouth every 6 (six) hours. Patient not taking: Reported on 03/09/2015 04/11/13   Martha Clan, MD  Insulin Glargine (LANTUS SOLOSTAR) 100 UNIT/ML Solostar Pen Give 10U of Lantus subcutaneously at bedtime. Increase Lantus by 5U if sugars >200. 03/09/15   Katheren Shams, DO  Insulin Pen Needle (B-D UF III MINI PEN NEEDLES) 31G X 5 MM MISC Use with Lantus pens. 03/09/15   Katheren Shams, DO  medroxyPROGESTERone (DEPO-PROVERA) 150 MG/ML injection Inject 1 mL (150 mg total) into the muscle every  3 (three) months. 05/10/13   Deirdre Freida Busman, CNM  metFORMIN (GLUCOPHAGE) 500 MG tablet Take 1 tablet (500 mg total) by mouth daily with breakfast. 03/09/15   Katheren Shams, DO  nystatin (MYCOSTATIN/NYSTOP) 100000 UNIT/GM POWD Apply to infected area three times daily. 03/09/15   Katheren Shams, DO  Prenatal Vit-Fe Fumarate-FA (PRENATAL MULTIVITAMIN) TABS Take 1 tablet by mouth daily.    Historical Provider, MD  simvastatin (ZOCOR) 40 MG tablet Take 1 tablet (40 mg total) by mouth at bedtime. 03/09/15   Katheren Shams, DO   BP 142/89 mmHg  Pulse 85  Temp(Src) 98.4 F (36.9 C) (Oral)  Resp 20  Ht 6' (1.829 m)  Wt 292 lb 8 oz (132.677 kg)  BMI 39.66 kg/m2  SpO2 98% Physical Exam  Constitutional: She is oriented to person, place, and time. No distress.  HENT:  Head: Normocephalic and atraumatic.  Eyes: EOM are normal. Pupils are equal, round, and reactive to light.  Neck: Normal range of motion. Neck supple.  Cardiovascular: Normal rate and intact distal pulses.   Pulmonary/Chest: No respiratory distress.  Abdominal: Soft. There is no tenderness.  Musculoskeletal: Normal range of motion.  Neurological: She is alert and oriented to person, place, and time.  Skin: No rash noted. She is not diaphoretic.  Psychiatric: She has a normal mood and affect.    ED Course  Procedures (including critical care time) Labs Review Labs Reviewed  COMPREHENSIVE METABOLIC PANEL - Abnormal; Notable for the following:    Sodium 130 (*)    CO2 16 (*)    Glucose, Bld 495 (*)    Alkaline Phosphatase 133 (*)    GFR calc non Af Amer 73 (*)    GFR calc Af Amer 84 (*)    All other components within normal limits  CBG MONITORING, ED - Abnormal; Notable for the following:    Glucose-Capillary 492 (*)    All other components within normal limits  CBC  URINALYSIS, ROUTINE W REFLEX MICROSCOPIC  KETONES, URINE  POC URINE PREG, ED  I-STAT VENOUS BLOOD GAS, ED    Imaging Review No results found.   EKG  Interpretation None      MDM   Final diagnoses:  None    A 39 year old female with past medical history of hypertension, type 2 diabetes diagnosed yesterday by her primary care physician. Coming in today with elevated bg w/o sx.  Exam as above, patient HDS.  Feel dka unlikely but will get poct glucose, bmp, vbg, urine ketones.  poct glucose 492.  Will give 2L NS and re-eval.  First bmp with bicarb 16, no AG and a glucose of 495.  VBG with ph 7.38.  Doubt dka but will send a repeat bmp after fluid hydration to see if bicarb has improved.  Patient has received 3L NS.  Repeat bg 318.  Bmp  with bicarb 22.  At this time, feel patient is safe for d/c with f/u with her pcp.  Will have her increase her lantus to 15 u daily.  It is likely at this point that we have yet to see the effects of her medicines as she has only had one dose of metformin and one dose of lantus.  Hopefully she will start to see improvement in her bg's over the next couple of days.  She will need close f/u with her pcp.  I have discussed the results, Dx and Tx plan with the patient. They expressed understanding and agree with the plan and were told to return to ED with any worsening of condition or concern.    Disposition: Discharge  Condition: Good  Discharge Medication List as of 03/10/2015  7:02 PM      Follow Up: [place] [place] [address] [id number] [place] [phone]  If symptoms worsen   Pt seen in conjunction with Dr. Fabio Asa, MD 03/11/15 [place], MD 03/11/15 1330

## 2015-03-10 NOTE — ED Notes (Signed)
Pt reports seeing PCP yesterday for blood sugar issues, was started on insulin and Metformin.  BS @ 12:15p 560.  Pt reports having increased thirst and urination.

## 2015-03-11 ENCOUNTER — Other Ambulatory Visit: Payer: Self-pay | Admitting: Obstetrics and Gynecology

## 2015-03-11 DIAGNOSIS — E119 Type 2 diabetes mellitus without complications: Secondary | ICD-10-CM

## 2015-03-16 ENCOUNTER — Ambulatory Visit (INDEPENDENT_AMBULATORY_CARE_PROVIDER_SITE_OTHER): Payer: Medicaid Other | Admitting: Pharmacist

## 2015-03-16 ENCOUNTER — Encounter: Payer: Self-pay | Admitting: Pharmacist

## 2015-03-16 VITALS — BP 142/95 | HR 77 | Ht 71.26 in | Wt 288.1 lb

## 2015-03-16 DIAGNOSIS — E119 Type 2 diabetes mellitus without complications: Secondary | ICD-10-CM

## 2015-03-16 DIAGNOSIS — I10 Essential (primary) hypertension: Secondary | ICD-10-CM | POA: Diagnosis not present

## 2015-03-16 NOTE — Assessment & Plan Note (Signed)
Diabetes diagnosed in April 2016 currently uncontrolled.   Denies hypoglycemic events and is able to verbalize appropriate hypoglycemia management plan.  Reports adherence with medication. Control is suboptimal due to A1c 13.7 and BG continuing to be consistently in the >400 readings.  Increased dose of basal insulin Lantus (insulin glargine) to 30 units daily. Patient will continue to titrate 2 unit/day if fasting CBGs > 200mg /dl until fasting CBGs reach goal of being less than 200 (short-term goal). Ultimate goal is to get CBG in 100s.  Increase metformin to 500 mg by mouth twice daily.  Might have to increase metformin to 1000mg  BID at next visit. Next A1C anticipated in July 2016.

## 2015-03-16 NOTE — Assessment & Plan Note (Signed)
At goal blood pressure today.   Consider alternate blood pressure lowering agent, possibly amlodipine, in the future in order to avoid or decrease dose of HCTZ.

## 2015-03-16 NOTE — Patient Instructions (Signed)
It was great to see you!  Please start taking metformin 500mg  tablet by mouth twice daily with meals  Please increase insulin glargine (Lantus) to 30 units at bedtime. Every day blood glucose is >200, increase insulin by 2 units. The goal right now is to get glucose <200. Once at this point, continue that dose of insulin.  Remember to check glucose at least twice daily and record.  See you in 2 weeks with pharmacy clinic.

## 2015-03-16 NOTE — Progress Notes (Signed)
S:    Patient arrives in good spirits today unassisted with kids.    Presents for diabetes management/evaluation. Patient presents as new onset T2DM with a previous history of gestational DM that was treated with insulin.   Patient reports adherence with medications.  Current diabetes medications include: insulin glargine (Lantus) 20 units SQ inj at bedtime (patient was given instructions to increase Lantus by 5 units if CBG >200) + metformin 500mg  daily.   Patient denies hypoglycemic events. Had a hyperglycemic event that resulted in the ED on 03/10/2015.  Patient reports poor dietary habits and no exercise. She understands she needs to stay away from fried foods and carbs.   Has symptoms of polyuria. Denies blurry vision or neuropathy.  Patient had rash she presented with on 03/09/15, most likely from acute hyperglycemia, that was treated with fluconazole and nystatin topical cream- rash has improved.   O:  . Lab Results  Component Value Date   HGBA1C 13.7 03/09/2015     Home fasting CBG: in 400s; lowest has been 385 this morning.  A/P: Diabetes diagnosed in April 2016 currently uncontrolled.   Denies hypoglycemic events and is able to verbalize appropriate hypoglycemia management plan.  Reports adherence with medication. Control is suboptimal due to A1c 13.7 and BG continuing to be consistently in the >400 readings.  Increased dose of basal insulin Lantus (insulin glargine) to 30 units daily. Patient will continue to titrate 2 unit/day if fasting CBGs > 200mg /dl until fasting CBGs reach goal of being less than 200 (short-term goal). Ultimate goal is to get CBG in 100s.  Increase metformin to 500 mg by mouth twice daily.  Might have to increase metformin to 1000mg  BID at next visit. Next A1C anticipated in July 2016.   Written patient instructions provided.  Follow up in Pharmacist Clinic Visit in 2 weeks.   Total time in face to face counseling 20 minutes.  Patient seen with Eunice Blase,  PharmD Candidate.

## 2015-03-18 NOTE — Progress Notes (Signed)
Patient ID: Darlene Bennett, female   DOB: 06/28/1976, 38 y.o.   MRN: [id number] Reviewed: Agree with Dr. Graylin Shiver documentation and management.

## 2015-03-25 ENCOUNTER — Encounter: Payer: Self-pay | Admitting: Obstetrics and Gynecology

## 2015-03-25 ENCOUNTER — Ambulatory Visit (INDEPENDENT_AMBULATORY_CARE_PROVIDER_SITE_OTHER): Payer: Medicaid Other | Admitting: Obstetrics and Gynecology

## 2015-03-25 VITALS — BP 143/88 | HR 91 | Temp 98.1°F | Ht 71.0 in | Wt 283.0 lb

## 2015-03-25 DIAGNOSIS — R809 Proteinuria, unspecified: Secondary | ICD-10-CM | POA: Diagnosis not present

## 2015-03-25 DIAGNOSIS — B3731 Acute candidiasis of vulva and vagina: Secondary | ICD-10-CM

## 2015-03-25 DIAGNOSIS — E1129 Type 2 diabetes mellitus with other diabetic kidney complication: Secondary | ICD-10-CM

## 2015-03-25 DIAGNOSIS — E119 Type 2 diabetes mellitus without complications: Secondary | ICD-10-CM

## 2015-03-25 DIAGNOSIS — I10 Essential (primary) hypertension: Secondary | ICD-10-CM

## 2015-03-25 DIAGNOSIS — B373 Candidiasis of vulva and vagina: Secondary | ICD-10-CM

## 2015-03-25 MED ORDER — METFORMIN HCL 1000 MG PO TABS: 1000.0000 mg | ORAL_TABLET | Freq: Two times a day (BID) | ORAL | Status: DC

## 2015-03-25 MED ORDER — INSULIN ASPART 100 UNIT/ML ~~LOC~~ SOLN: SUBCUTANEOUS | Status: DC

## 2015-03-25 MED ORDER — AMLODIPINE BESYLATE 5 MG PO TABS: 5.0000 mg | ORAL_TABLET | Freq: Every day | ORAL | Status: DC

## 2015-03-25 MED ORDER — LISINOPRIL 10 MG PO TABS: 10.0000 mg | ORAL_TABLET | Freq: Every day | ORAL | Status: DC

## 2015-03-25 NOTE — Assessment & Plan Note (Signed)
A: Patient continuing to have hyperglycemia. Sugars continue to be uncontrolled. Reports adherence to medication. P: Lantus increased to 50 units at bedtime. Metformin changed to 1000 mg twice a day. Also started NovoLog 10 units once daily with patient's largest meal. Advised patient to take her CBGs at least 4 times a week so we can better determine her sugars. Patient was encouraged to go to diabetes education class this week. Patient has appointment for pharmacy clinic in 1 week. She is to return to clinic in 3-4 weeks. Restated to goal CBGs is in the 100s. Advised patient to continue to eat better and exercise.

## 2015-03-25 NOTE — Assessment & Plan Note (Signed)
A: Urine microalbumin 29. Cr .99. Patient with some form of diabetic nephropathy. P: Started Lisinopril today. Well continue to work on controlling patient's diabetes and BP better. Counseled patient on significance of lab result and meaning. Will recheck urine microalbumin when CBGs more stable to monitor for improvement.

## 2015-03-25 NOTE — Assessment & Plan Note (Signed)
Improving but still present. Patient should continue using nystatin powder. Best thing for patient is to control blood pressures better to help with healing process.

## 2015-03-25 NOTE — Patient Instructions (Addendum)
It was great to see you today!  I am pleased to hear that things are going well for you.  Here are some of the things we discussed today: -Please up your Lantus to 50U at nighttime -I am adding a meal time coverage of Novolog to your regimen. Please take 10U of Novolog with your biggest meal daily. -Now you will take 1000mg  of Metformin twice a day. -It is important that you take you check your blood sugar at least 4 times a day. In the AM, with 2 meals, and at bedtime -Please make sure to follow-up in the pharmacy clinic so we can continue to monitor you. -Continue to use the powder for your rash. It is healing well -For your blood pressure and protein in your urine I am adjusting your medications.  See below. -please attend your diabetes education class!  Please schedule a follow-up appointment for 3-4 weeks.   Thanks for allowing me to be a part of your care! Dr. Gerarda Fraction      Diet Recommendations for Diabetes   Starchy (carb) foods include: Bread, rice, pasta, potatoes, corn, crackers, bagels, muffins, all baked goods.   Protein foods include: Meat, fish, poultry, eggs, dairy foods, and beans such as pinto and kidney beans (beans also provide carbohydrate).   1. Eat at least 3 meals and 1-2 snacks per day. Never go more than 4-5 hours while awake without eating.  2. Limit starchy foods to TWO per meal and ONE per snack. ONE portion of a starchy  food is equal to the following:   - ONE slice of bread (or its equivalent, such as half of a hamburger bun).   - 1/2 cup of a "scoopable" starchy food such as potatoes or rice.   - 1 OUNCE (28 grams) of starchy snack foods such as crackers or pretzels (look on label).   - 15 grams of carbohydrate as shown on food label.  3. Both lunch and dinner should include a protein food, a carb food, and vegetables.   - Obtain twice as many veg's as protein or carbohydrate foods for both lunch and dinner.   - Try to keep frozen veg's on hand for a  quick vegetable serving.     - Fresh or frozen veg's are best.  4. Breakfast should always include protein.

## 2015-03-25 NOTE — Progress Notes (Signed)
    Subjective: Chief Complaint  Patient presents with  . Follow-up    diabetes    HPI: Darlene Bennett is a 39 y.o. presenting to clinic today to discuss the following:  #Diabetes:  Sugars - High at home: 420     Low at home: 360 Taking medications: taking meds, Lantus 42U at bedtime and metformin 500mg  BID Side effects: Denies. No hypoglycemic episodes. No GI upset. ROS: denies fever, chills, dizziness, LOC, polyuria, numbness or tingling in extremities or chest pain. Visual distrubances: resolved, no longer having blurred vision Endorses still being thirsty Patient with urine microalbuminuria Patient states that she is trying to improve her diet. She has cut back on fried foods to once a week. She is eating less sweets. Still endorsing drinking soda.  Has appointment with diabetes education tomorrow. Patient is only checking her blood sugars once or twice a day. She did not have her blood glucose meter with her today.  #Hypertension: Blood pressure at home: doesn't check Blood pressure today: 143/88 Taking Meds: Yes, HCTZ Side effects: None ROS: Denies headache, dizziness, visual changes, nausea, vomiting, chest pain, abdominal pain or shortness of breath.  #Rash:  Patient unable to see rash Believes that it has improved States that it no longer feels tender overall She finished her fluconazole pills and continues to use powder  ROS reviewed and were negative unless otherwise noted in HPI. Past Medical, Surgical, Social, and Family History Reviewed & Updated per EMR.   Objective: BP 143/88 mmHg  Pulse 91  Temp(Src) 98.1 F (36.7 C) (Oral)  Ht 5\' 11"  (1.803 m)  Wt 283 lb (128.368 kg)  BMI 39.49 kg/m2  Physical Exam General: well-appearing, obese AA female, no distress Cardiovascular: Normal rate, regular rhythm, normal heart sounds and intact distal pulses.  Pulmonary/Chest: Effort normal and breath sounds normal. She has no wheezes.  Genitourinary: Vulva exhibits  rash. Non-tender. No vaginal discharge found.  Extensive vulvo-cutaneous candidiasis with maceration and fissuring that is improving. Satellite lesions present. Area extends from mons pubis to gluteal cleft.  Skin: Skin is warm and dry. Rash noted.   Assessment/Plan: Please see problem based Assessment and Plan  Meds ordered this encounter  Medications  . amLODipine (NORVASC) 5 MG tablet    Sig: Take 1 tablet (5 mg total) by mouth daily.    Dispense:  90 tablet    Refill:  3  . lisinopril (PRINIVIL,ZESTRIL) 10 MG tablet    Sig: Take 1 tablet (10 mg total) by mouth daily.    Dispense:  90 tablet    Refill:  3  . insulin aspart (NOVOLOG) 100 UNIT/ML injection    Sig: Please inject 10U once daily with your biggest meal.    Dispense:  1 vial    Refill:  PRN    Order Specific Question:  Lot Number?    Answer:  [id number]    Order Specific Question:  Expiration Date?    Answer:  07/04/2016    Order Specific Question:  NDC    Answer:  [id number] [[id number]]    Order Specific Question:  Quantity    Answer:  1  . metFORMIN (GLUCOPHAGE) 1000 MG tablet    Sig: Take 1 tablet (1,000 mg total) by mouth 2 (two) times daily with a meal.    Dispense:  180 tablet    Refill:  [place], DO 03/25/2015, 3:50 PM PGY-1, [place]

## 2015-03-25 NOTE — Assessment & Plan Note (Signed)
A: Blood pressure slightly elevated today.  P: Switch patient's blood pressure medication to amlodipine 5mg  and lisinopril 10mg . Will titrate as appropriate. Discontinue HCTZ. Patient to follow-up blood pressures.

## 2015-03-26 ENCOUNTER — Encounter: Payer: Medicaid Other | Attending: "Endocrinology

## 2015-03-26 ENCOUNTER — Telehealth: Payer: Self-pay | Admitting: Obstetrics and Gynecology

## 2015-03-26 VITALS — Ht 72.0 in | Wt 284.2 lb

## 2015-03-26 DIAGNOSIS — Z713 Dietary counseling and surveillance: Secondary | ICD-10-CM | POA: Insufficient documentation

## 2015-03-26 DIAGNOSIS — Z794 Long term (current) use of insulin: Secondary | ICD-10-CM | POA: Diagnosis not present

## 2015-03-26 DIAGNOSIS — E119 Type 2 diabetes mellitus without complications: Secondary | ICD-10-CM | POA: Diagnosis not present

## 2015-03-26 MED ORDER — INSULIN GLARGINE 100 UNIT/ML SOLOSTAR PEN: 50.0000 [IU] | PEN_INJECTOR | Freq: Every day | SUBCUTANEOUS | Status: DC

## 2015-03-26 NOTE — Progress Notes (Signed)
I was preceptor the day of this visit.   

## 2015-03-26 NOTE — Telephone Encounter (Signed)
Pt calling because during her visit yesterday she was told that her insulin, Lantus, would be called in for her and it has not been received by the pharmacy. She would like for it to be sent to [facility] on [address] in [place]. Thanks Willaim Rayas Reynolds, ASA

## 2015-03-26 NOTE — Telephone Encounter (Signed)
Will forward to MD to send rx to pharmacy. Kalicia Dufresne,CMA  

## 2015-03-26 NOTE — Telephone Encounter (Signed)
Rx sent to pharmacy   

## 2015-03-26 NOTE — Progress Notes (Signed)

## 2015-03-27 ENCOUNTER — Telehealth: Payer: Self-pay | Admitting: Obstetrics and Gynecology

## 2015-03-27 NOTE — Telephone Encounter (Signed)
Will forward to MD to advise. Joshalyn Ancheta,CMA  

## 2015-03-27 NOTE — Telephone Encounter (Signed)
Is taking 50 units of lantus a day. After 2 uses, the pen is out She can be buying med every 2 days Please advise

## 2015-03-27 NOTE — Telephone Encounter (Signed)
Spoke to pharmacy and patient. Patient has two options switch to a 300U pen which would cost more or switch to vial insulin that she would have to draw up. Patient states that at her next refill she would like to do the vials. She has drawn up insulin before.

## 2015-03-30 NOTE — Telephone Encounter (Signed)
Patient will contact us when she runs out of refills. Darlene Bennett,CMA

## 2015-04-01 ENCOUNTER — Other Ambulatory Visit: Payer: Self-pay | Admitting: Obstetrics and Gynecology

## 2015-04-01 NOTE — Telephone Encounter (Signed)
Needs needles for novolog

## 2015-04-02 ENCOUNTER — Ambulatory Visit (INDEPENDENT_AMBULATORY_CARE_PROVIDER_SITE_OTHER): Payer: Medicaid Other | Admitting: Pharmacist

## 2015-04-02 ENCOUNTER — Encounter: Payer: Self-pay | Admitting: Pharmacist

## 2015-04-02 VITALS — BP 146/78 | HR 94 | Wt 290.2 lb

## 2015-04-02 DIAGNOSIS — E119 Type 2 diabetes mellitus without complications: Secondary | ICD-10-CM | POA: Diagnosis not present

## 2015-04-02 DIAGNOSIS — F17200 Nicotine dependence, unspecified, uncomplicated: Secondary | ICD-10-CM

## 2015-04-02 DIAGNOSIS — Z720 Tobacco use: Secondary | ICD-10-CM | POA: Diagnosis not present

## 2015-04-02 DIAGNOSIS — I10 Essential (primary) hypertension: Secondary | ICD-10-CM

## 2015-04-02 MED ORDER — INSULIN PEN NEEDLE 31G X 8 MM MISC: 1.0000 | Freq: Three times a day (TID) | Status: DC

## 2015-04-02 MED ORDER — INSULIN GLARGINE 100 UNIT/ML SOLOSTAR PEN: 55.0000 [IU] | PEN_INJECTOR | Freq: Every day | SUBCUTANEOUS | Status: DC

## 2015-04-02 MED ORDER — INSULIN ASPART 100 UNIT/ML ~~LOC~~ SOLN: SUBCUTANEOUS | Status: DC

## 2015-04-02 MED ORDER — ALBIGLUTIDE 30 MG ~~LOC~~ PEN: PEN_INJECTOR | SUBCUTANEOUS | Status: DC

## 2015-04-02 NOTE — Progress Notes (Signed)
S:    Patient arrives in good spirits today ambulating without assistance.    Presents for diabetes, blood pressure, and tobacco dependence management.  Patient reports recent diagnosis of Diabetes since the year of 2016 with previous history of gestational DM that was treated with insulin. Diagnosed with Hypertension in the year of 2016.  Patient reports adherence with medications.  Current diabetes medications include: Lantus 50 units in evening + Novolog 10 units with biggest meal (dinner) + metformin 1000mg  BID Current BP Medications include:  Amlodipine 5mg  daily + lisinopril 10mg  daily   Patient denies hypoglycemic events.  Patient reported dietary habits are still poor but have improved- she understands she needs to stay away from fried foods.  Patient reported exercise habits consist of running behind 39 year old toddler.    Patient reports nocturia about 1x/night but has improved. Patient denies neuropathy. Patient denies visual changes.  Patient had rash from 03/09/2015, most likely from acute hyperglycemia, but has improved and continues to use topical nystatin cream.  Patient denies symptoms of orthostatic hypotension.  O:  Lab Results  Component Value Date   HGBA1C 13.7 03/09/2015    Home fasting CBG today 289 (claims this is lowest its ever been) Avg CBGs still in 400s most days  Last 3 Office BP readings: BP Readings from Last 3 Encounters:  04/02/15 146/78  03/25/15 143/88  03/16/15 142/95   A/P: Diabetes diagnosed in 2016 currently uncontrolled due to avg CBGs still ranging in 400s.   Denies hypoglycemic events and is able to verbalize appropriate hypoglycemia management plan.  Plan is to increase Novolog to 20 units in evening before dinner, increase Lantus to 55 units in evening, and add tanzeum 30mg  once weekly SQ injection.  Counseled patient that hyperglycemic rash and polyuria will improve with better controlled CBGs.  History of hypertension since 2016 which  currently is controlled on current medications.  Continue amlodipine 5mg  daily + lisinopril 10mg  daily.  Moderate nicotine dependence- currently smoking about 6 cigarettes per day.  She understands the benefits of quitting and is willing to reduce her smoking down to 3 cigarettes per day.  Triggers seem to be after meals.  Patient seems confident that if she can reduce her smoking down to 3 cigarettes per day, that she can quit completely.   Next A1C anticipated in July 2016.  Written patient instructions provided.  Follow up with Dr. Gerarda Fraction in June.   Total time in face to face counseling 35 minutes.  Patient seen with Eunice Blase, PharmD. Candidate.

## 2015-04-02 NOTE — Assessment & Plan Note (Signed)
Diabetes diagnosed in 2016 currently uncontrolled due to avg CBGs still ranging in 400s.   Denies hypoglycemic events and is able to verbalize appropriate hypoglycemia management plan.  Plan is to increase Novolog to 20 units in evening before dinner, increase Lantus to 55 units in evening, and add tanzeum 30mg  once weekly SQ injection.  Counseled patient that hyperglycemic rash and polyuria will improve with better controlled CBGs.

## 2015-04-02 NOTE — Patient Instructions (Addendum)
Start taking Novolog (insulin aspart- rapid acting insulin) 20 units with largest meal (dinner) Start taking Lantus (insulin glargine- long acting insulin) 55 units once daily in evening. Start taking Tanzeum (albiglutide) 30mg  SQ injection once weekly.  The goal is to get your blood glucose in the 100s. Keep measuring your sugars at home- write them down and bring to next appointment.  Try to check more sugars in the evening.   Look for signs of low sugars (hypoglycemia) such as: sweating, confused, hungry, shaky.   Your goal is to reduce the number of cigarettes smoked to 3 per day :) You can do it!!   Follow up with Dr. Gerarda Fraction in June  You can decide when you want to see Dr. Valentina Lucks next.

## 2015-04-02 NOTE — Progress Notes (Signed)

## 2015-04-02 NOTE — Assessment & Plan Note (Signed)
History of hypertension since 2016 which currently is controlled on current medications.  Continue amlodipine 5mg  daily + lisinopril 10mg  daily.

## 2015-04-02 NOTE — Progress Notes (Signed)
Patient ID: Darlene Bennett, female   DOB: 09/26/1976, 39 y.o.   MRN: [id number] Reviewed: agree with Dr. Graylin Shiver documentation and management.

## 2015-04-02 NOTE — Assessment & Plan Note (Signed)
Moderate nicotine dependence- currently smoking about 6 cigarettes per day.  She understands the benefits of quitting and is willing to reduce her smoking down to 3 cigarettes per day.  Triggers seem to be after meals.  Patient seems confident that if she can reduce her smoking down to 3 cigarettes per day, that she can quit completely.

## 2015-04-04 IMAGING — US US OB FOLLOW-UP
1 series · 12 of 24 positions shown · non-contrast
Comparison: none

[Series 1: us ob follow-up · 12 of 24 slices shown]
[im 2/24]
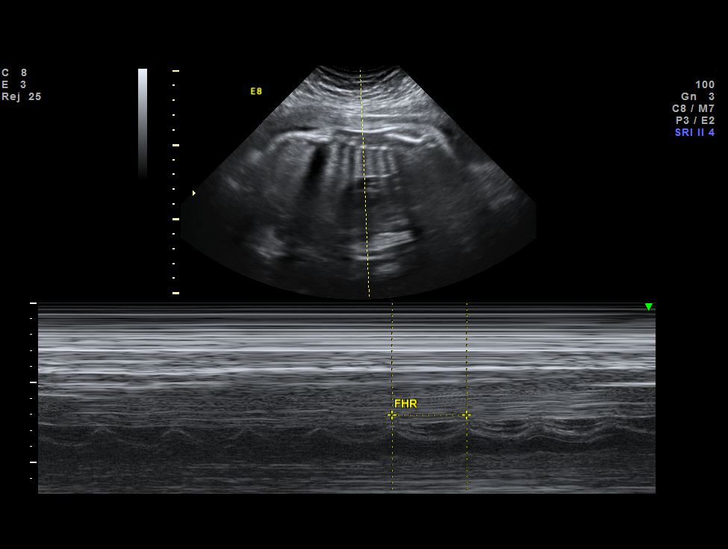
[im 4/24]
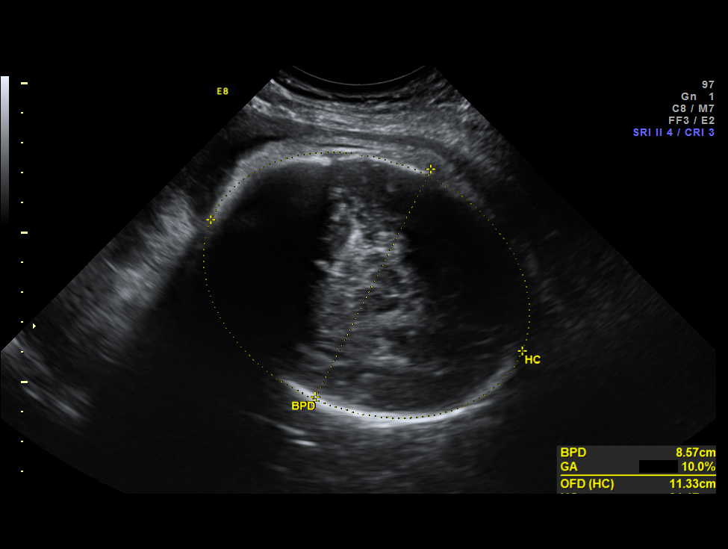
[im 6/24]
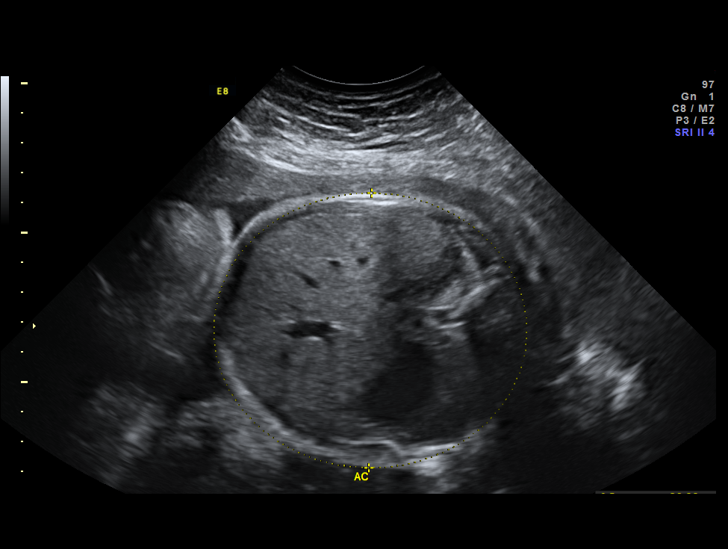
[im 8/24]
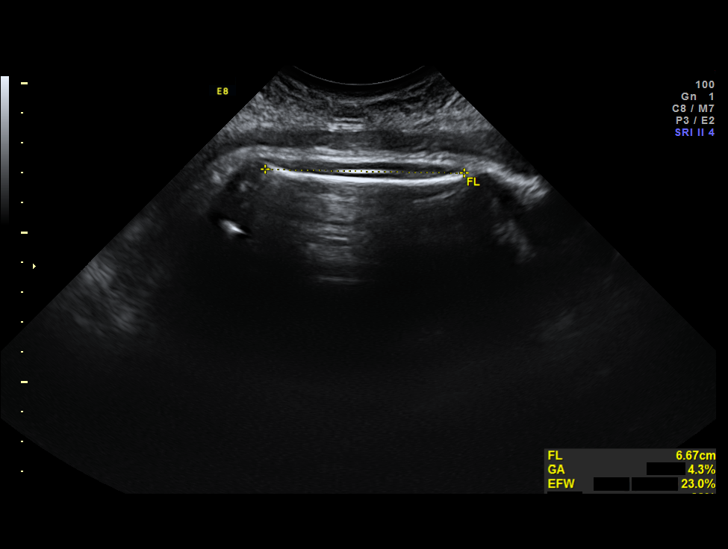
[im 10/24]
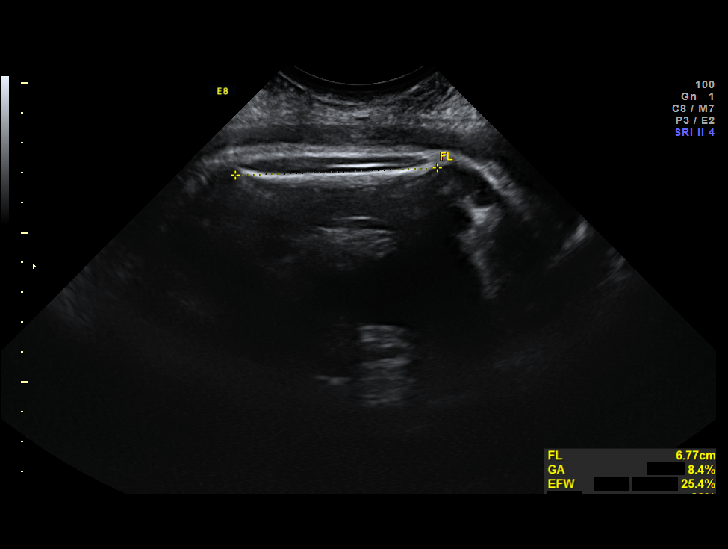
[im 12/24]
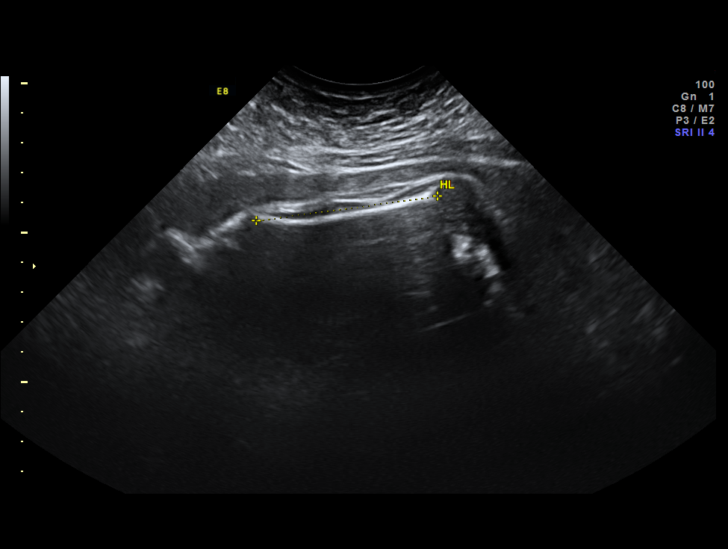
[im 14/24]
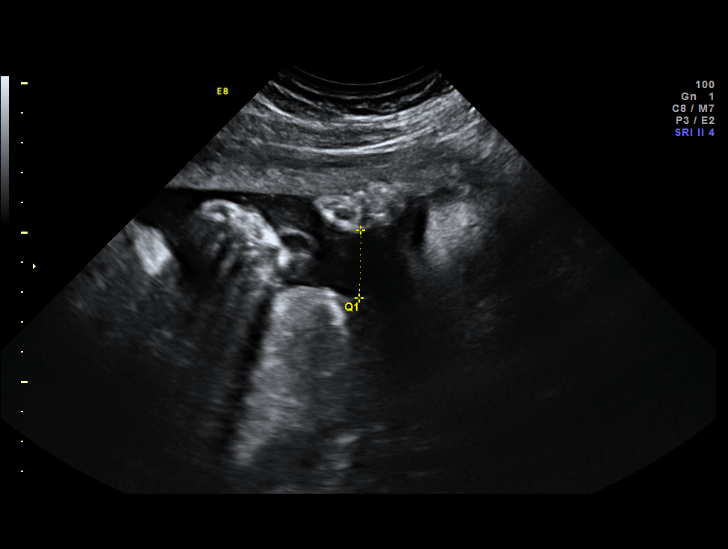
[im 16/24]
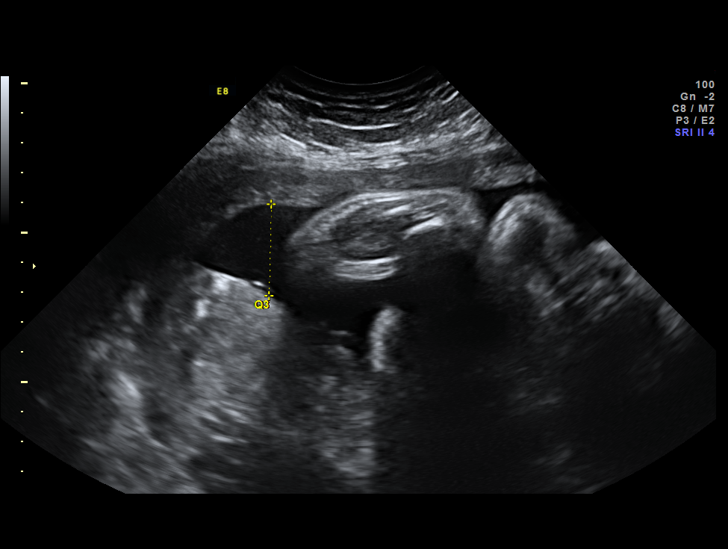
[im 18/24]
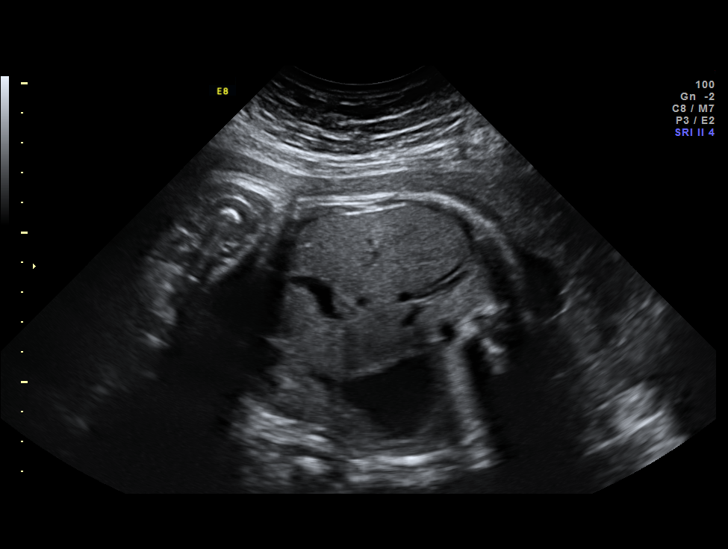
[im 20/24]
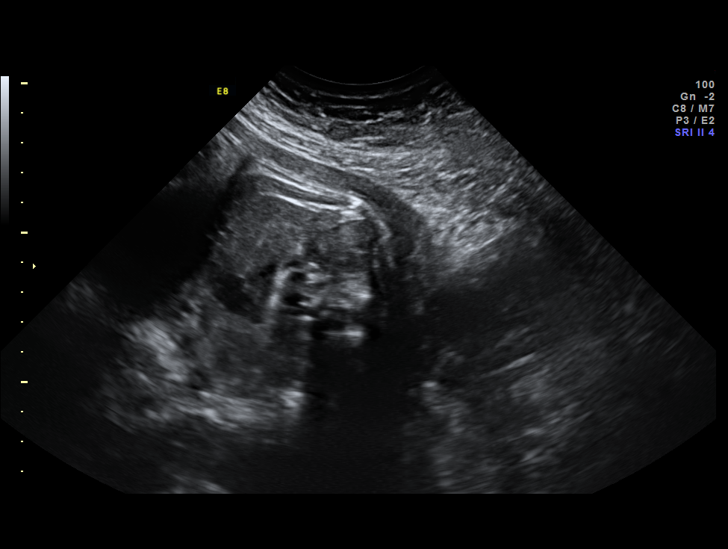
[im 22/24]
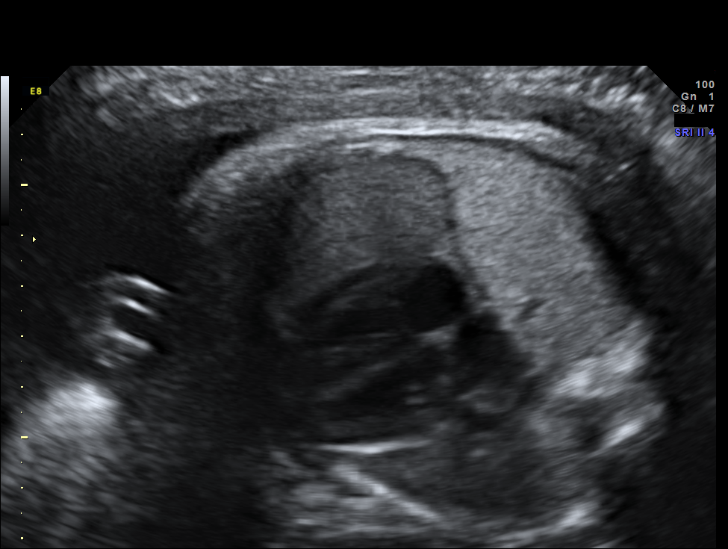
[im 24/24]
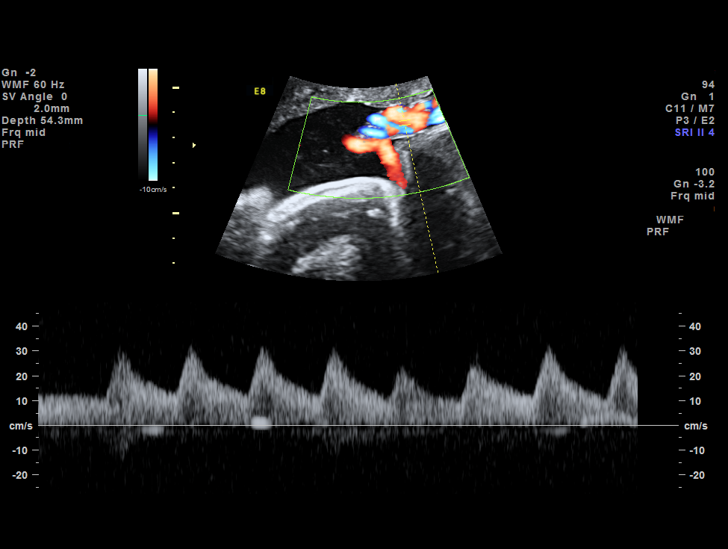

[12 of 24 positions shown; findings below may reference images not displayed]

OBSTETRICS REPORT
                      (Signed Final 04/05/2013 [DATE])

Service(s) Provided

 US OB FOLLOW UP                                       76816.1
Indications

 Advanced maternal age (AMA), Multigravida (36);
 declined screening
 Poor obstetric history: Previous preterm delivery
 (34 weeks)
 Poor obstetric history: Previous gestational
 diabetes
 Poor obstetric history: Previous preeclampsia (37
 week induction)
 Poor obstetric history: Previous fetal growth
 restriction (FGR)
 Maternal morbid obesity (296 lb)
 Diabetes - Gestational, A2 (treated with insulin)
 Cholestasis of pregnancy on Actigall per prenatals
Fetal Evaluation

 Num Of Fetuses:    1
 Fetal Heart Rate:  135                          bpm
 Cardiac Activity:  Observed
 Presentation:      Cephalic
 Placenta:          Posterior, above cervical
                    os

 Amniotic Fluid
 AFI FV:      Subjectively within normal limits
 AFI Sum:     10.76   cm       29  %Tile     Larg Pckt:    5.42  cm
 RUQ:   2.27    cm   LUQ:    5.42   cm    LLQ:   3.07    cm
Biometry

 BPD:     86.8  mm     G. Age:  35w 0d                CI:         76.3   70 - 86
 OFD:    113.8  mm                                    FL/HC:      21.1   20.8 -

 HC:     316.4  mm     G. Age:  35w 4d        7  %    HC/AC:      1.03   0.92 -

 AC:     307.7  mm     G. Age:  34w 5d       13  %    FL/BPD:     77.1   71 - 87
 FL:      66.9  mm     G. Age:  34w 3d        6  %    FL/AC:      21.7   20 - 24
 HUM:       61  mm     G. Age:  35w 2d       43  %

 Est. FW:    2911  gm      5 lb 8 oz     23  %
Gestational Age

 LMP:           36w 5d        Date:  07/22/12                 EDD:   04/28/13
 U/S Today:     34w 6d                                        EDD:   05/11/13
 Best:          36w 5d     Det. By:  LMP  (07/22/12)          EDD:   04/28/13
Anatomy

 Cranium:          Appears normal         Aortic Arch:      Previously seen
 Fetal Cavum:      Appears normal         Ductal Arch:      Previously seen
 Ventricles:       Appears normal         Diaphragm:        Previously seen
 Choroid Plexus:   Previously seen        Stomach:          Appears normal
 Cerebellum:       Previously seen        Abdomen:          Appears normal
 Posterior Fossa:  Previously seen        Abdominal Wall:   Previously seen
 Nuchal Fold:      Previously seen        Cord Vessels:     Previously seen
 Face:             Appears normal         Kidneys:          Appear normal
                   (orbits and profile)
 Lips:             Previously seen        Bladder:          Appears normal
 Heart:            Appears normal         Spine:            Previously seen
                   (4CH, axis, and
                   situs)
 RVOT:             Previously seen        Lower             Previously seen
                                          Extremities:
 LVOT:             Previously seen        Upper             Previously seen
                                          Extremities:

 Other:  Nasal bone previously visualized. Heels and 5th digit previously
         visualized. Female gender.
Cervix Uterus Adnexa

 Cervix:       Not visualized (advanced GA >34 wks)
Impression

 IUP at 36+5 weeks
 Normal interval anatomy; anatomic survey complete
 Normal amniotic fluid volume
 Appropriate interval growth with EFW at the 23rd %tile
 NST reactive
Recommendations

 Delivery planned for early next week

 questions or concerns.

## 2015-04-09 ENCOUNTER — Encounter: Payer: Medicaid Other | Attending: "Endocrinology

## 2015-04-09 DIAGNOSIS — Z713 Dietary counseling and surveillance: Secondary | ICD-10-CM | POA: Insufficient documentation

## 2015-04-09 DIAGNOSIS — E119 Type 2 diabetes mellitus without complications: Secondary | ICD-10-CM | POA: Diagnosis not present

## 2015-04-09 DIAGNOSIS — Z794 Long term (current) use of insulin: Secondary | ICD-10-CM | POA: Insufficient documentation

## 2015-04-10 ENCOUNTER — Telehealth: Payer: Self-pay | Admitting: *Deleted

## 2015-04-10 NOTE — Telephone Encounter (Signed)
Received a message from [facility] needing PCP to call regarding drug-drug interaction between amlodipine 5 mg and simvastatin. Please call [phone]. Derl Barrow, RN

## 2015-04-13 ENCOUNTER — Encounter (HOSPITAL_COMMUNITY): Payer: Self-pay | Admitting: Emergency Medicine

## 2015-04-13 ENCOUNTER — Emergency Department (HOSPITAL_COMMUNITY)
Admission: EM | Admit: 2015-04-13 | Discharge: 2015-04-13 | Disposition: A | Discharge status: Home / Self Care | Payer: Medicaid Other | Attending: Emergency Medicine | Admitting: Emergency Medicine

## 2015-04-13 DIAGNOSIS — E119 Type 2 diabetes mellitus without complications: Secondary | ICD-10-CM | POA: Insufficient documentation

## 2015-04-13 DIAGNOSIS — I10 Essential (primary) hypertension: Secondary | ICD-10-CM | POA: Insufficient documentation

## 2015-04-13 DIAGNOSIS — Z8632 Personal history of gestational diabetes: Secondary | ICD-10-CM | POA: Diagnosis not present

## 2015-04-13 DIAGNOSIS — Z720 Tobacco use: Secondary | ICD-10-CM | POA: Insufficient documentation

## 2015-04-13 DIAGNOSIS — Z794 Long term (current) use of insulin: Secondary | ICD-10-CM | POA: Insufficient documentation

## 2015-04-13 DIAGNOSIS — H538 Other visual disturbances: Secondary | ICD-10-CM | POA: Diagnosis not present

## 2015-04-13 DIAGNOSIS — Z79899 Other long term (current) drug therapy: Secondary | ICD-10-CM | POA: Insufficient documentation

## 2015-04-13 LAB — CBG MONITORING, ED: Glucose-Capillary: 100 mg/dL — ABNORMAL HIGH (ref 70–99)

## 2015-04-13 NOTE — ED Notes (Signed)
Awake. Verbally responsive. A/O x4. Resp even and unlabored. No audible adventitious breath sounds noted. ABC's intact.  

## 2015-04-13 NOTE — ED Notes (Signed)
Pt reported having blurred vision and reported that this happened before when her CBG was elevated but she checked her CBG this morning at home and it was 114. Pt denies burning/drainage. Screla white. No redness noted.

## 2015-04-13 NOTE — Telephone Encounter (Signed)
Discussed with pharmacist. Patient will discontinue simvastatin and when she needs a refill will switch to lipitor.

## 2015-04-13 NOTE — Discharge Instructions (Signed)
Check your blood sugar at home frequently and take your diabetes medications as directed. You must follow-up with your primary care doctor in the next 48 hours. Call to make an appointment as soon as possible. Also follow-up with the ophthalmologist  Do not Hesitate to return to the emergency room for any new, worsening or concerning symptoms.

## 2015-04-13 NOTE — ED Notes (Signed)
Pt with Hx of diabetes c/o blurred vision in eyes bilaterally onset 7 days ago. Pt c/o history of the same when first diagnosed with DM.

## 2015-04-13 NOTE — Progress Notes (Signed)
Patient was seen on 04/09/15 for the third of a series of three diabetes self-management courses at the [facility].   Darlene Bennett the amount of activity recommended for healthy living . Describe activities suitable for individual needs . Identify ways to regularly incorporate activity into daily life . Identify barriers to activity and ways to over come these barriers  Identify diabetes medications being personally used and their primary action for lowering glucose and possible side effects . Describe role of stress on blood glucose and develop strategies to address psychosocial issues . Identify diabetes complications and ways to prevent them  Explain how to manage diabetes during illness . Evaluate success in meeting personal goal . Establish 2-3 goals that they will plan to diligently work on until they return for the  54-month follow-up visit  Goals:   I will count my carb choices at most meals and snacks  I will be active 30 minutes or more 5 times a week  I will take my diabetes medications as scheduled  I will eat less unhealthy fats by eating less fat  I will test my glucose at least 3 times a day, 7 days a week  I will look at patterns in my record book at least 7 days a month  To help manage stress I will  exercise at least 5 times a week  Your patient has identified these potential barriers to change:  Motivation  Your patient has identified their diabetes self-care support plan as  Family Support  Plan:  Attend Core 4 in 4 months

## 2015-04-13 NOTE — ED Provider Notes (Signed)
CSN: [id number]     Arrival date & time 04/13/15  1019 History   First MD Initiated Contact with Patient 04/13/15 1029     Chief Complaint  Patient presents with  . Blurred Vision     (Consider location/radiation/quality/duration/timing/severity/associated sxs/prior Treatment) HPI   Darlene Bennett is a 39 y.o. female with past medical history significant for insulin-dependent diabetes, hypertension complaining of blurred vision eyes bilaterally onset 1-1/2 weeks ago. States that the vision is been constantly blurred bilaterally over the last 10 days, it is not worsening. States she was in the neighborhood of the hospital metastases why she chose to present for evaluation today. States that she's been compliant with her diabetes medications. States that she had a similar episode when she was initially diagnosed with diabetes. Reports her last A1c in February was 13. She follows at family practice at [facility], she does not see an ophthalmologist. Blood sugar this morning was 115. Denies polyuria, polydipsia cervicalgia, LOC/syncope, change in vision, N/V, numbness, weakness, dysarthria, ataxia, eye redness, eye discharge.  Past Medical History  Diagnosis Date  . History of gestational diabetes in prior pregnancy, currently pregnant   . Gestational diabetes mellitus, antepartum 02/14/2013  . Cholelithiasis   . Gestational diabetes     insulin  . Hypertension     preeclampsia with previous pregnancy   Past Surgical History  Procedure Laterality Date  . Tubal ligation      2007   Family History  Problem Relation Age of Onset  . Heart disease Mother   . Hypertension Mother   . Diabetes Mother   . Hypertension Father   . Diabetes Father    History  Substance Use Topics  . Smoking status: Current Every Day Smoker -- 0.50 packs/day    Types: Cigarettes  . Smokeless tobacco: Never Used  . Alcohol Use: No   OB History    Gravida Para Term Preterm AB TAB SAB Ectopic Multiple Living   _0 0 0 0 0 0 4     Review of Systems  10 systems reviewed and found to be negative, except as noted in the HPI.   Allergies  Review of patient's allergies indicates no known allergies.  Home Medications   Prior to Admission medications   Medication Sig Start Date End Date Taking? Authorizing Provider  ACCU-CHEK FASTCLIX LANCETS MISC 1 each by Does not apply route 4 (four) times daily -  before meals and at bedtime. Check sugar 6 x daily 03/09/15   Katheren Shams, DO  Albiglutide 30 MG PEN Inject 85m SQ once weekly. 04/02/15   WZenia Resides MD  amLODipine (NORVASC) 5 MG tablet Take 1 tablet (5 mg total) by mouth daily. 03/25/15   JKatheren Shams DO  Blood Glucose Monitoring Suppl (ACCU-CHEK NANO SMARTVIEW) W/DEVICE KIT 1 Device by Does not apply route 4 (four) times daily -  before meals and at bedtime. 03/09/15   JKatheren Shams DO  insulin aspart (NOVOLOG) 100 UNIT/ML injection Please inject 20U once daily with your biggest meal. 04/02/15   WZenia Resides MD  Insulin Glargine (LANTUS SOLOSTAR) 100 UNIT/ML Solostar Pen Inject 55 Units into the skin daily at 10 pm. 04/02/15   WZenia Resides MD  Insulin Pen Needle (B-D ULTRAFINE III SHORT PEN) 31G X 8 MM MISC 1 Container by Does not apply route 3 (three) times daily. 04/02/15   WZenia Resides MD  lisinopril (PRINIVIL,ZESTRIL) 10 MG tablet Take 1  tablet (10 mg total) by mouth daily. 03/25/15   Katheren Shams, DO  medroxyPROGESTERone (DEPO-PROVERA) 150 MG/ML injection Inject 1 mL (150 mg total) into the muscle every 3 (three) months. 05/10/13   Deirdre Freida Busman, CNM  metFORMIN (GLUCOPHAGE) 1000 MG tablet Take 1 tablet (1,000 mg total) by mouth 2 (two) times daily with a meal. 03/25/15   Katheren Shams, DO  nystatin (MYCOSTATIN/NYSTOP) 100000 UNIT/GM POWD Apply to infected area three times daily. 03/09/15   Katheren Shams, DO  simvastatin (ZOCOR) 40 MG tablet Take 1 tablet (40 mg total) by mouth at bedtime. 03/09/15   Katheren Shams, DO   BP  155/97 mmHg  Pulse 92  Temp(Src) 98.3 F (36.8 C) (Oral)  Resp 18  SpO2 98% Physical Exam  Constitutional: She is oriented to person, place, and time. She appears well-developed and well-nourished. No distress.  HENT:  Head: Normocephalic and atraumatic.  Mouth/Throat: Oropharynx is clear and moist.  Eyes: Conjunctivae and EOM are normal. Pupils are equal, round, and reactive to light. Right eye exhibits no discharge. Left eye exhibits no discharge. No scleral icterus.  Neck: Normal range of motion. Neck supple.  No midline C-spine  tenderness to palpation or step-offs appreciated. Patient has full range of motion without pain.   Cardiovascular: Normal rate, regular rhythm and intact distal pulses.   Pulmonary/Chest: Effort normal and breath sounds normal. No stridor.  Abdominal: Soft. Bowel sounds are normal.  Musculoskeletal: Normal range of motion.  Neurological: She is alert and oriented to person, place, and time.  II-Visual fields grossly intact. III/IV/VI-Extraocular movements intact.  Pupils reactive bilaterally. V/VII-Smile symmetric, equal eyebrow raise,  facial sensation intact VIII- Hearing grossly intact IX/X-Normal gag XI-bilateral shoulder shrug XII-midline tongue extension Motor: 5/5 bilaterally with normal tone and bulk Cerebellar: Normal finger-to-nose  and normal heel-to-shin test.   Romberg negative Ambulates with a coordinated gait    Psychiatric: She has a normal mood and affect.  Nursing note and vitals reviewed.   ED Course  Procedures (including critical care time) Labs Review Labs Reviewed  CBG MONITORING, ED    Imaging Review No results found.   EKG Interpretation None      MDM   Final diagnoses:  Blurred vision, bilateral    Filed Vitals:   04/13/15 1027 04/13/15 1044  BP: 155/97   Pulse: 92   Temp: 98.3 F (36.8 C)   TempSrc: Oral   Resp: 18   SpO2: 99% 98%    Darlene Bennett is a pleasant 39 y.o. female presenting with  bilateral blurred vision onset 2 days ago similar to prior episode when she was diagnosed with diabetes. Patient states she's been compliant with her medications and blood sugar in the ED is perfect at 100. Her last A1c was 13. Think this is likely a progression of her diabetic retinopathy secondary to poor diet glycemic control. I doubt this is a CVA. Patient has normal neuro exam and no other associated signs or symptoms. Acuity check with no significant decrease in her vision. Patient states that she's never seen an ophthalmologist, stress to her the importance of following up with the ophthalmologist and also she will need to check in with her PCP at [facility] family practice this week. Patient verbalized her understanding.  Evaluation does not show pathology that would require ongoing emergent intervention or inpatient treatment. Pt is hemodynamically stable and mentating appropriately. Discussed findings and plan with patient/guardian, who agrees with care plan. All questions answered.  Return precautions discussed and outpatient follow up given.    Monico Blitz, PA-C 04/13/15 [place], DO 04/15/15 1317

## 2015-04-15 ENCOUNTER — Telehealth: Payer: Self-pay | Admitting: Obstetrics and Gynecology

## 2015-04-15 MED ORDER — INSULIN SYRINGES (DISPOSABLE) U-100 1 ML MISC: Status: DC

## 2015-04-15 NOTE — Telephone Encounter (Signed)
Syringes prescribed and sent to pharmacy

## 2015-04-15 NOTE — Telephone Encounter (Signed)
Pt called and said that she doesn't have any syringes and could we call some in. jw

## 2015-04-15 NOTE — Telephone Encounter (Signed)
Will forward to PCP for review. General Wearing, CMA. 

## 2015-04-16 NOTE — Telephone Encounter (Signed)
LMOVM stating as directed below. Tori Cupps, CMA.

## 2015-04-24 ENCOUNTER — Encounter: Payer: Self-pay | Admitting: Obstetrics and Gynecology

## 2015-04-24 ENCOUNTER — Ambulatory Visit (INDEPENDENT_AMBULATORY_CARE_PROVIDER_SITE_OTHER): Payer: Medicaid Other | Admitting: Obstetrics and Gynecology

## 2015-04-24 VITALS — BP 138/89 | HR 77 | Temp 98.4°F | Ht 72.0 in | Wt 291.0 lb

## 2015-04-24 DIAGNOSIS — E119 Type 2 diabetes mellitus without complications: Secondary | ICD-10-CM | POA: Insufficient documentation

## 2015-04-24 DIAGNOSIS — B3731 Acute candidiasis of vulva and vagina: Secondary | ICD-10-CM

## 2015-04-24 DIAGNOSIS — E1169 Type 2 diabetes mellitus with other specified complication: Secondary | ICD-10-CM | POA: Diagnosis not present

## 2015-04-24 DIAGNOSIS — F17200 Nicotine dependence, unspecified, uncomplicated: Secondary | ICD-10-CM

## 2015-04-24 DIAGNOSIS — I10 Essential (primary) hypertension: Secondary | ICD-10-CM

## 2015-04-24 DIAGNOSIS — Z720 Tobacco use: Secondary | ICD-10-CM

## 2015-04-24 DIAGNOSIS — B373 Candidiasis of vulva and vagina: Secondary | ICD-10-CM | POA: Diagnosis not present

## 2015-04-24 NOTE — Assessment & Plan Note (Addendum)
A: Patient with better controlled CBGS this last week. Without symptoms of hyper- or hypoglycemia P:  -Continue current glycemic medications including metformin 2000mg , Novolog 20 units in evening before dinner, Lantus 55 units at bedtime, and tanzeum 30mg  SQ injection once weekly.  -Continue to monitor CBGs; counseled patient on monitoring for signs of hypoglycemia -Recheck A1c at next visit -Order placed for opthalmology exam due to recurrent intermittent blurred vision (concern for diabetic retinopathy)

## 2015-04-24 NOTE — Patient Instructions (Signed)
I put in a referral for you to get an eye exam. Someone will call you about setting up an appointment.  Continue to keep up the good work I am proud of your progress! Schedule appointment for next month to recheck your Hbg A1c.   Diabetes and Exercise Diabetes mellitus is a common, chronic disease, in which the pancreas is unable to adequately control blood glucose (sugar) levels. There are 2 types of diabetes. Type 1 diabetes patients are unable to produce insulin, a hormone that causes sugar in the blood to be stored in the body. People with type 1 diabetes may compensate by giving themselves injections of insulin. Type 2 diabetes involves not producing adequate amounts of insulin to control blood glucose levels. People with type 2 diabetes control their blood glucose by monitoring their food intake or by taking medicine. Exercise is an important part of diabetes treatment. During exercise, the muscles use a greater amount of glucose from the blood for energy. This lowers your blood glucose, which is the same effect you would get from taking insulin. It has been shown that endurance athletes are more sensitive to insulin than inactive people. SYMPTOMS  Many people with a mild case of diabetes have no symptoms. However, if left uncontrolled, diabetes can lead to several complications that could be prevented with treatment of the disease. General symptoms of diabetes include:   Frequent urination (polyuria).  Frequent thirst and drinking (polydipsia).  Increased food consumption (polyphagia).  Fatigue.  Poor exercise performance.  Blurred vision.  Inflammation of the vagina (vaginitis) caused by fungal infections.  Skin infections (uncommon).  Numbness in the feet, caused by nerve injury.  Kidney disease. CAUSES  The cause of most cases of diabetes is unknown. In children, diabetes is often due to an autoimmune response to the cells in the pancreas that make insulin. It is also linked  with other diseases, such as cystic fibrosis. Diabetes may have a genetic link. PREVENTION  Athletes should strive to begin exercise with blood glucose in a well-controlled state.  Feet should always be kept clean and dry.  Activities in which low blood sugar levels cannot be treated easily (scuba diving, rock climbing, swimming) should be avoided.  Anticipate alterations in diet or training to avoid low blood sugar (hypoglycemia) and high blood sugar (hyperglycemia).  Athletes should try to increase sugar consumption after strenuous exercise to avoid hypoglycemia.  Short-acting insulin should not be injected into an actively exercising muscle. The athlete should rest the injection site for about 1 hour after exercise.  Patients with diabetes should get routine checkups of the feet to prevent complications. PROGNOSIS  Exercise provides many benefits to the person with diabetes:   Reduced body fat.  Lower blood pressure.  Often, reduced need for medicines.  Improved exercise tolerance.  Lower insulin levels.  Weight loss.  Improved lipid profile (decreased cholesterol and low-density lipoproteins). RELATED COMPLICATIONS  If performed incorrectly, exercise can result in complications of diabetes:   Poor control of blood sugar, when exercise is performed at the wrong time.  Increase in renal disease, from loss of body fluids (dehydration).  Increased risk of nerve injury (neuropathy) when performing exercises that increase foot injury.  Increased risk of eye problems when performing activities that involve breath holding or lowering or jarring the head.  Increased risk of sudden death from exercise in patients with heart disease.  Worsening of hypertension with heavy lifting (more than 10 lb/4.5 kg). Altered blood glucose and insulin dose as  a result of mild illness that produces loss of appetite.  Altered uptake of insulin after injection when insulin injection site is  changed. NOTE: Exercise can lower blood glucose effectively, but the effects are short-lasting (no more than a couple of days). Exercise has been shown to improve your sensitivity to insulin. This may alter how your body responds to a given dose of injected insulin. It is important for every patient with diabetes to know how his or her body may react to exercise, and to adjust insulin dosages accordingly. TREATMENT  Eat about 1 to 3 hours before exercise.  Check blood glucose immediately before and after exercise.  Stop exercise if blood glucose is more than 250 mg/dL.  Stop exercise if blood glucose is less than 100 mg/dL.  Do not exercise within 1 hour of an insulin injection.  Be prepared to treat low blood glucose while exercising. Keep some sugar product with you, such as a candy bar.  For prolonged exercise, use a sports drink to maintain your glucose level.  Replace used-up glucose in the body after exercise.  Consume fluids during and after exercise to avoid dehydration. SEEK MEDICAL CARE IF:  You have vision changes after a run.  You notice a loss of sensation in your feet after exercise.  You have increased numbness, tingling, or pins and needles sensations after exercise.  You have chest pain during or after exercise.  You have a fast, irregular heartbeat (palpitations) during or after exercise.  Your exercise tolerance gets worse.  You have fainting or dizzy spells for brief periods during or after exercise. Document Released: 11/21/2005 Document Revised: 04/07/2014 Document Reviewed: 03/05/2009 [facility] Patient Information 2015 [place], [place]. This information is not intended to replace advice given to you by your health care provider. Make sure you discuss any questions you have with your health care provider.

## 2015-04-24 NOTE — Assessment & Plan Note (Signed)
Resolved

## 2015-04-24 NOTE — Assessment & Plan Note (Addendum)
Patient continues to smoke about 3-4 cigs a day. Encouraged her on the health benefits of quitting smoking. Will continue to follow.

## 2015-04-24 NOTE — Assessment & Plan Note (Signed)
Stable and controlled on medications. Continue medications.

## 2015-04-24 NOTE — Progress Notes (Signed)
    Subjective: Chief Complaint  Patient presents with  . Diabetes    HPI: Darlene Bennett is a 39 y.o. presenting to clinic today to discuss the following:  #Hypertension: Pt does not check blood pressure at home but when she gets it checked she says it is mostly <140/90 Taking Meds: compliant per patient taking amlodipine and lisinopril Side effects: None ROS: Denies headache, dizziness, visual changes, nausea, vomiting, chest pain, abdominal pain or shortness of breath.  #Diabetes:  Sugars last week - High at home: 139    Low at home: 92   Has changed diet to baked goods that have been helping  Taking medications:  Yes 2000mg  metformin, Lantus 55U bedtime, Novolog 20U before dinner, Takes on Fridays tanzeum 30mg  once weekly SQ injection Side effects: Hypoglycemia none ROS: denies fever, chills, dizziness, LOC, polyuria, polydipsia, numbness or tingling in extremities or chest pain. Last eye exam: unknown    Visual distrubances: None the last week but was seen in ED recently for disturbances  #Tobacco -continues to smoke 3-4 cigs a day   Health Maintenance: Needs opthalmology exam   ROS per HPI.  Past Medical, Surgical, Social, and Family History Reviewed & Updated per EMR.   Objective: BP 138/89 mmHg  Pulse 77  Temp(Src) 98.4 F (36.9 C) (Oral)  Ht 6' (1.829 m)  Wt 291 lb (131.997 kg)  BMI 39.46 kg/m2  Physical Exam  Constitutional: She is oriented to person, place, and time and well-developed, well-nourished, and in no distress.  HENT:  Head: Normocephalic and atraumatic.  Eyes: EOM are normal.  Cardiovascular: Normal rate, regular rhythm, normal heart sounds and intact distal pulses.   Pulmonary/Chest: Effort normal and breath sounds normal.  Abdominal: Soft. Bowel sounds are normal.  Musculoskeletal: Normal range of motion. She exhibits no edema.  Neurological: She is alert and oriented to person, place, and time.  No focal deficits appreciated  Skin: Skin is  warm and dry.  Psychiatric: Mood and affect normal.   Assessment/Plan: Please see problem based Assessment and Plan  Orders Placed This Encounter  Procedures  . Ambulatory referral to Ophthalmology    Referral Priority:  Routine    Referral Type:  Consultation    Referral Reason:  Specialty Services Required    Requested Specialty:  Ophthalmology    Number of Visits Requested:  [place], DO 04/24/2015, 2:19 PM PGY-1, [place]

## 2015-04-30 NOTE — Progress Notes (Signed)
I was preceptor for this office visit.  

## 2015-05-19 ENCOUNTER — Other Ambulatory Visit: Payer: Self-pay | Admitting: Obstetrics and Gynecology

## 2015-05-19 MED ORDER — ATORVASTATIN CALCIUM 40 MG PO TABS: 40.0000 mg | ORAL_TABLET | Freq: Every day | ORAL | Status: DC

## 2015-05-25 ENCOUNTER — Ambulatory Visit (INDEPENDENT_AMBULATORY_CARE_PROVIDER_SITE_OTHER): Payer: Medicaid Other | Admitting: Obstetrics and Gynecology

## 2015-05-25 ENCOUNTER — Encounter: Payer: Self-pay | Admitting: Obstetrics and Gynecology

## 2015-05-25 VITALS — BP 138/92 | HR 73 | Temp 98.8°F | Ht 72.0 in | Wt 286.0 lb

## 2015-05-25 DIAGNOSIS — Z3009 Encounter for other general counseling and advice on contraception: Secondary | ICD-10-CM

## 2015-05-25 DIAGNOSIS — E1169 Type 2 diabetes mellitus with other specified complication: Secondary | ICD-10-CM

## 2015-05-25 DIAGNOSIS — I10 Essential (primary) hypertension: Secondary | ICD-10-CM | POA: Diagnosis not present

## 2015-05-25 DIAGNOSIS — Z309 Encounter for contraceptive management, unspecified: Secondary | ICD-10-CM

## 2015-05-25 DIAGNOSIS — Z3042 Encounter for surveillance of injectable contraceptive: Secondary | ICD-10-CM

## 2015-05-25 DIAGNOSIS — Z3049 Encounter for surveillance of other contraceptives: Secondary | ICD-10-CM

## 2015-05-25 HISTORY — DX: Encounter for contraceptive management, unspecified: Z30.9

## 2015-05-25 MED ORDER — LISINOPRIL 20 MG PO TABS: 20.0000 mg | ORAL_TABLET | Freq: Every day | ORAL | Status: DC

## 2015-05-25 MED ORDER — MEDROXYPROGESTERONE ACETATE 150 MG/ML IM SUSP
150.0000 mg | Freq: Once | INTRAMUSCULAR | Status: AC
  Administered 2015-05-25: 150 mg via INTRAMUSCULAR

## 2015-05-25 NOTE — Progress Notes (Signed)
    Subjective: Chief Complaint  Patient presents with  . Contraception    would like to get Depo shot done here at office now instead of [place]    HPI: Darlene Bennett is a 39 y.o. presenting to clinic today to discuss the following:  #Contraceptaion: -wants to get depo shots here -been on depo 2 years -h/o of tubal ligation 2007 in [place] but still got pregnant -does not want to switch contraceptive  #Diabetes:  Sugars today at home: Fast 82 and lunchtime 103 Taking medications: Yes Side effects: denies hypoglycemia ROS: denies fever, chills, dizziness, LOC, polyuria, polydipsia, numbness or tingling in extremities or chest pain. Last eye exam: Was scheduled for eye exam today will need to reschedule Visual distrubances: none  Tobacco -2 cigs a day   ROS reviewed and were negative unless otherwise noted in HPI.  Past Medical, Surgical, Social, and Family History Reviewed & Updated per EMR.   Objective: BP 138/92 mmHg  Pulse 73  Temp(Src) 98.8 F (37.1 C) (Oral)  Ht 6' (1.829 m)  Wt 286 lb (129.729 kg)  BMI 38.78 kg/m2  Physical Exam  Constitutional: She is oriented to person, place, and time and well-developed, well-nourished, and in no distress.  HENT:  Head: Normocephalic and atraumatic.  Mouth/Throat: Oropharynx is clear and moist.  Eyes: EOM are normal.  Cardiovascular: Normal rate, regular rhythm, normal heart sounds and intact distal pulses.   Pulmonary/Chest: Effort normal and breath sounds normal.  Abdominal: Soft. Bowel sounds are normal.  Musculoskeletal: Normal range of motion. She exhibits no edema.  Neurological: She is alert and oriented to person, place, and time.  No focal deficits appreciated  Skin: Skin is warm and dry. No rash noted.  Psychiatric: Mood and affect normal.    No results found for this or any previous visit (from the past 72 hour(s)).  Assessment/Plan: Please see problem based Assessment and Plan   Orders Placed This Encounter   Procedures  . POCT glycosylated hemoglobin (Hb A1C)    Standing Status: Future     Number of Occurrences:      Standing Expiration Date: 06/24/2015    Meds ordered this encounter  Medications  . medroxyPROGESTERone (DEPO-PROVERA) injection 150 mg    Sig:   . lisinopril (PRINIVIL,ZESTRIL) 20 MG tablet    Sig: Take 1 tablet (20 mg total) by mouth daily.    Dispense:  30 tablet    Refill:  [place], DO 05/25/2015, 3:15 PM PGY-1, [place]

## 2015-05-25 NOTE — Assessment & Plan Note (Signed)
Patient received Depo injection today. Next inj. time frame Sept 5 -19th. Counseled patient and handout given. Discuused side effects of longterm depo with patient. She wants to continue use of this contraception for now.

## 2015-05-25 NOTE — Assessment & Plan Note (Addendum)
Sugars seem to be improved on current regimen. Patient to RTC tmrw for POCT A1c. Will contact patient about results and adjust regimen as needed. Pt to reschedule opthalmology appointment.

## 2015-05-25 NOTE — Assessment & Plan Note (Signed)
BP mildly elevated today. Will continue to monitor and adjust medications if needed at next visit. Discussed plan with patient who agreed to wait on medication changes as she will try and improve her lifestyle.

## 2015-05-25 NOTE — Patient Instructions (Addendum)
Darlene Bennett it was great to see you today!  I am pleased to hear that things are going well for you.  Here are some of the things we discussed today: -Come back into clinic tomorrow to get your A1c checked(call office prior to coming to make sure they have supplies). I will call you with the results and adjust your medications if needed. -You received your Depo shot today. Your next injection will be Sept 5 -19, 2016. -Someone will call you about rescheduling your eye appointment -Keep up the good work and work on exercise, diet control, and quitting smoking which will all greatly improve your health.   New medications: None  Please schedule a follow-up appointment for 3 months or as needed  Thanks for allowing me to be a part of your care! Dr. Gerarda Fraction     Medroxyprogesterone injection [Contraceptive] What is this medicine? MEDROXYPROGESTERONE (me DROX ee proe JES te rone) contraceptive injections prevent pregnancy. They provide effective birth control for 3 months. Depo-subQ Provera 104 is also used for treating pain related to endometriosis. This medicine may be used for other purposes; ask your health care provider or pharmacist if you have questions. COMMON BRAND NAME(S): Depo-Provera, Depo-subQ Provera 104 What should I tell my health care provider before I take this medicine? They need to know if you have any of these conditions: -frequently drink alcohol -asthma -blood vessel disease or a history of a blood clot in the lungs or legs -bone disease such as osteoporosis -breast cancer -diabetes -eating disorder (anorexia nervosa or bulimia) -high blood pressure -HIV infection or AIDS -kidney disease -liver disease -mental depression -migraine -seizures (convulsions) -stroke -tobacco smoker -vaginal bleeding -an unusual or allergic reaction to medroxyprogesterone, other hormones, medicines, foods, dyes, or preservatives -pregnant or trying to get  pregnant -breast-feeding How should I use this medicine? Depo-Provera Contraceptive injection is given into a muscle. Depo-subQ Provera 104 injection is given under the skin. These injections are given by a health care professional. You must not be pregnant before getting an injection. The injection is usually given during the first 5 days after the start of a menstrual period or 6 weeks after delivery of a baby. Talk to your pediatrician regarding the use of this medicine in children. Special care may be needed. These injections have been used in female children who have started having menstrual periods. Overdosage: If you think you have taken too much of this medicine contact a poison control center or emergency room at once. NOTE: This medicine is only for you. Do not share this medicine with others. What if I miss a dose? Try not to miss a dose. You must get an injection once every 3 months to maintain birth control. If you cannot keep an appointment, call and reschedule it. If you wait longer than 13 weeks between Depo-Provera contraceptive injections or longer than 14 weeks between Depo-subQ Provera 104 injections, you could get pregnant. Use another method for birth control if you miss your appointment. You may also need a pregnancy test before receiving another injection. What may interact with this medicine? Do not take this medicine with any of the following medications: -bosentan This medicine may also interact with the following medications: -aminoglutethimide -antibiotics or medicines for infections, especially rifampin, rifabutin, rifapentine, and griseofulvin -aprepitant -barbiturate medicines such as phenobarbital or primidone -bexarotene -carbamazepine -medicines for seizures like ethotoin, felbamate, oxcarbazepine, phenytoin, topiramate -modafinil -St. John's wort This list may not describe all possible interactions. Give your health care provider  a list of all the medicines,  herbs, non-prescription drugs, or dietary supplements you use. Also tell them if you smoke, drink alcohol, or use illegal drugs. Some items may interact with your medicine. What should I watch for while using this medicine? This drug does not protect you against HIV infection (AIDS) or other sexually transmitted diseases. Use of this product may cause you to lose calcium from your bones. Loss of calcium may cause weak bones (osteoporosis). Only use this product for more than 2 years if other forms of birth control are not right for you. The longer you use this product for birth control the more likely you will be at risk for weak bones. Ask your health care professional how you can keep strong bones. You may have a change in bleeding pattern or irregular periods. Many females stop having periods while taking this drug. If you have received your injections on time, your chance of being pregnant is very low. If you think you may be pregnant, see your health care professional as soon as possible. Tell your health care professional if you want to get pregnant within the next year. The effect of this medicine may last a long time after you get your last injection. What side effects may I notice from receiving this medicine? Side effects that you should report to your doctor or health care professional as soon as possible: -allergic reactions like skin rash, itching or hives, swelling of the face, lips, or tongue -breast tenderness or discharge -breathing problems -changes in vision -depression -feeling faint or lightheaded, falls -fever -pain in the abdomen, chest, groin, or leg -problems with balance, talking, walking -unusually weak or tired -yellowing of the eyes or skin Side effects that usually do not require medical attention (report to your doctor or health care professional if they continue or are bothersome): -acne -fluid retention and swelling -headache -irregular periods, spotting, or  absent periods -temporary pain, itching, or skin reaction at site where injected -weight gain This list may not describe all possible side effects. Call your doctor for medical advice about side effects. You may report side effects to FDA at 1-800-FDA-1088. Where should I keep my medicine? This does not apply. The injection will be given to you by a health care professional. NOTE: This sheet is a summary. It may not cover all possible information. If you have questions about this medicine, talk to your doctor, pharmacist, or health care provider.  2015, [employer]vier/[employer] Standard. (2008-12-12 18:37:56)

## 2015-05-26 ENCOUNTER — Other Ambulatory Visit (INDEPENDENT_AMBULATORY_CARE_PROVIDER_SITE_OTHER): Payer: Medicaid Other

## 2015-05-26 DIAGNOSIS — E1169 Type 2 diabetes mellitus with other specified complication: Secondary | ICD-10-CM

## 2015-05-26 LAB — POCT GLYCOSYLATED HEMOGLOBIN (HGB A1C): Hemoglobin A1C: 7.9

## 2015-05-27 ENCOUNTER — Ambulatory Visit: Payer: Medicaid Other

## 2015-07-29 ENCOUNTER — Other Ambulatory Visit: Payer: Self-pay | Admitting: Obstetrics and Gynecology

## 2015-08-28 ENCOUNTER — Encounter: Payer: Self-pay | Admitting: Obstetrics and Gynecology

## 2015-08-28 ENCOUNTER — Ambulatory Visit (INDEPENDENT_AMBULATORY_CARE_PROVIDER_SITE_OTHER): Payer: Medicaid Other | Admitting: Obstetrics and Gynecology

## 2015-08-28 VITALS — BP 138/88 | HR 85 | Ht 72.0 in | Wt 286.0 lb

## 2015-08-28 DIAGNOSIS — I10 Essential (primary) hypertension: Secondary | ICD-10-CM

## 2015-08-28 DIAGNOSIS — E119 Type 2 diabetes mellitus without complications: Secondary | ICD-10-CM

## 2015-08-28 DIAGNOSIS — Z3009 Encounter for other general counseling and advice on contraception: Secondary | ICD-10-CM

## 2015-08-28 LAB — POCT GLYCOSYLATED HEMOGLOBIN (HGB A1C): Hemoglobin A1C: 6.2

## 2015-08-28 LAB — POCT URINE PREGNANCY: PREG TEST UR: NEGATIVE

## 2015-08-28 MED ORDER — INSULIN ASPART 100 UNIT/ML ~~LOC~~ SOLN: SUBCUTANEOUS | Status: DC

## 2015-08-28 MED ORDER — MEDROXYPROGESTERONE ACETATE 150 MG/ML IM SUSP
150.0000 mg | Freq: Once | INTRAMUSCULAR | Status: AC
  Administered 2015-08-28: 150 mg via INTRAMUSCULAR

## 2015-08-28 MED ORDER — INSULIN GLARGINE 100 UNIT/ML SOLOSTAR PEN: 40.0000 [IU] | PEN_INJECTOR | Freq: Every day | SUBCUTANEOUS | Status: DC

## 2015-08-28 NOTE — Progress Notes (Signed)
Subjective: Chief Complaint  Patient presents with  . Diabetes  . Contraception    HPI: Darlene Bennett is a 39 y.o. presenting to clinic today to discuss the following:  #Diabetes:  Sugars - Did not bring meter today; states that she has not seen any values greater than 200. When she checked this morning was 130 Patient states she has been doing better with her diet Taking medications: yes Side effects: None; denies hypoglycemia On statin: Yes Last eye exam: Unsure; still has not made appointment with opthalmology  ROS: denies fever, chills, dizziness, LOC, polyuria, polydipsia, numbness or tingling in extremities or chest pain, visual disturbances.  #Hypertension Blood pressure at home: Does not check  Blood pressure today: Elevated on admission but recheck wnl Taking Meds: Only Norvasc inconsistently; saw on tv side effect of lisinopril being swelling lips so stopped taking all together Side effects: none  ROS: Denies headache, dizziness, visual changes, nausea, vomiting, chest pain, abdominal pain or shortness of breath.  # Due for depo vaccine: Was supposed to get another injection in the beginning of September but missed it because provider did not have any clinic appointments. Patient was unaware that she could come in for nurse visit. She has had unprotected intercourse recently.   Health Maintenance: Due for flue vaccine   ROS in HPI.  Past Medical, Surgical, Social, and Family History Reviewed & Updated per EMR.   Objective: BP 138/88 mmHg  Pulse 85  Ht 6' (1.829 m)  Wt 286 lb (129.729 kg)  BMI 38.78 kg/m2  Physical Exam  Constitutional: She is well-developed, well-nourished, and in no distress.  HENT:  Head: Normocephalic and atraumatic.  Eyes: EOM are normal.  Cardiovascular: Normal rate, regular rhythm and intact distal pulses.   Pulmonary/Chest: Effort normal and breath sounds normal.  Abdominal: Soft. There is no tenderness.  Musculoskeletal: Normal  range of motion. She exhibits no edema.  Neurological: She is alert.  Skin: Skin is warm and dry.    Assessment/Plan: Essential hypertension BP today within normal range. Patient non-compliant with medications; had stopped taking her lisinopril. Encouraged patient to continue using medications as prescribed. Continue current regimen. No adjustments at this time. Could consider stopping one of her medications and maximizing one agent at next visit if BPs still wnl. Patient doing lifestyle modifications and it is showing in her BP and labs.   Type 2 diabetes mellitus A1c today 6.2 down from last check. Patient doing well on current therapy. Sugars are not going above 200. Will reduce patient's insulin regimen at this time to Lantus 40U qhs and Novolog 10U once a day with largest meal (usually dinner). Also will continue metformin at current dose. Patient to continue lifestyle changes. Continue monitoring CBGs if she gets hypoglycemia she is to call so we can adjust regimen. Hypoglycemia measures discussed. Will follow-up in 3 months for next check.  Contraceptive management Late for Depo injection. Depo injection received today. Upreg negative. Patient to return for next dose Dec. 9-23rd, 2016. Discussed with patient that even if I am not available she can come in and have injection she just needs to make a nurse appointment.    Health Maintainance: Declined flu vaccine   Orders Placed This Encounter  Procedures  . POCT A1C  . POCT urine pregnancy    Meds ordered this encounter  Medications  . Insulin Glargine (LANTUS SOLOSTAR) 100 UNIT/ML Solostar Pen    Sig: Inject 40 Units into the skin at bedtime.  Dispense:  15 mL    Refill:  2  . insulin aspart (NOVOLOG) 100 UNIT/ML injection    Sig: Please inject 10U once daily with your biggest meal.    Dispense:  2 vial    Refill:  2    Order Specific Question:  Lot Number?    Answer:  [id number]    Order Specific Question:  Expiration  Date?    Answer:  07/04/2016    Order Specific Question:  NDC    Answer:  [id number] [[id number]]    Order Specific Question:  Quantity    Answer:  1  . medroxyPROGESTERone (DEPO-PROVERA) injection 150 mg    Sig:      Luiz Blare, DO PGY-2, [facility]

## 2015-08-28 NOTE — Patient Instructions (Signed)
Ms. Savary it was great to see you today!  I am pleased to hear that things are going well for you.  Here are some of the things we discussed today: -A1c today was 6.2! You are continuing to trend down -We discussed reducing your insulin. Please make the following changes Lantus 40U nightly and Novolog 10U with largest meal -You received Depo today. Next dose due Dec. 9-23rd, 2016. -You declined flu vaccine; it is highly encouraged  Please schedule a follow-up appointment for 3 months.    Thanks for allowing me to be a part of your care! Dr. Gerarda Fraction

## 2015-08-31 NOTE — Assessment & Plan Note (Signed)
A1c today 6.2 down from last check. Patient doing well on current therapy. Sugars are not going above 200. Will reduce patient's insulin regimen at this time to Lantus 40U qhs and Novolog 10U once a day with largest meal (usually dinner). Also will continue metformin at current dose. Patient to continue lifestyle changes. Continue monitoring CBGs if she gets hypoglycemia she is to call so we can adjust regimen. Hypoglycemia measures discussed. Will follow-up in 3 months for next check.

## 2015-08-31 NOTE — Assessment & Plan Note (Signed)
BP today within normal range. Patient non-compliant with medications; had stopped taking her lisinopril. Encouraged patient to continue using medications as prescribed. Continue current regimen. No adjustments at this time. Could consider stopping one of her medications and maximizing one agent at next visit if BPs still wnl. Patient doing lifestyle modifications and it is showing in her BP and labs.

## 2015-08-31 NOTE — Assessment & Plan Note (Signed)
Late for Depo injection. Depo injection received today. Upreg negative. Patient to return for next dose Dec. 9-23rd, 2016. Discussed with patient that even if I am not available she can come in and have injection she just needs to make a nurse appointment.

## 2015-10-01 ENCOUNTER — Other Ambulatory Visit: Payer: Self-pay | Admitting: Obstetrics and Gynecology

## 2015-11-09 ENCOUNTER — Ambulatory Visit (INDEPENDENT_AMBULATORY_CARE_PROVIDER_SITE_OTHER): Payer: Medicaid Other | Admitting: Obstetrics and Gynecology

## 2015-11-09 VITALS — Ht 72.0 in | Wt 288.6 lb

## 2015-11-09 DIAGNOSIS — Z3042 Encounter for surveillance of injectable contraceptive: Secondary | ICD-10-CM

## 2015-11-09 DIAGNOSIS — E118 Type 2 diabetes mellitus with unspecified complications: Secondary | ICD-10-CM | POA: Diagnosis not present

## 2015-11-09 LAB — POCT GLYCOSYLATED HEMOGLOBIN (HGB A1C): HEMOGLOBIN A1C: 5.8

## 2015-11-09 MED ORDER — INSULIN ASPART 100 UNIT/ML ~~LOC~~ SOLN: SUBCUTANEOUS | Status: DC

## 2015-11-09 MED ORDER — MEDROXYPROGESTERONE ACETATE 150 MG/ML IM SUSP
150.0000 mg | Freq: Once | INTRAMUSCULAR | Status: AC
  Administered 2015-11-09: 150 mg via INTRAMUSCULAR

## 2015-11-09 MED ORDER — INSULIN GLARGINE 100 UNIT/ML SOLOSTAR PEN: 30.0000 [IU] | PEN_INJECTOR | Freq: Every day | SUBCUTANEOUS | Status: DC

## 2015-11-09 NOTE — Progress Notes (Signed)
     Subjective: Chief Complaint  Patient presents with  . Diabetes  . Contraception    HPI: Darlene Bennett is a 38 y.o. presenting to clinic today to discuss the following:  #Diabetes:  Sugars - High at home: 115    Low at home: 27 Taking medications: Yes Side effects: Hypoglycemia -no On statin Last eye exam: has not seen eye doctor; denies vision changes Last foot exam: 2016 ROS: denies fever, chills, dizziness, LOC, polyuria, polydipsia, numbness or tingling in extremities, chest pain, visual disturbances.  #Contraception -due for depo -is s/p tubal ligation but had pregnancy after so needs another form of contraception -no side effects doing well  #Tobacco -3 cigs a day  ROS in HPI.  Past Medical, Surgical, Social, and Family History Reviewed & Updated per EMR.   Objective: Ht 6' (1.829 m)  Wt 288 lb 9.6 oz (130.908 kg)  BMI 39.13 kg/m2  Physical Exam  Constitutional: She is well-developed, well-nourished, and in no distress.  Cardiovascular: Normal rate, regular rhythm and normal heart sounds.   Pulmonary/Chest: Effort normal and breath sounds normal.  Neurological: She is alert.  Skin: Skin is warm and dry.  Psychiatric: Mood and affect normal.    Results for orders placed or performed in visit on 11/09/15 (from the past 72 hour(s))  POCT A1C     Status: None   Collection Time: 11/09/15  3:33 PM  Result Value Ref Range   Hemoglobin A1C 5.8     Assessment/Plan: Please see problem based Assessment and Plan  Orders Placed This Encounter  Procedures  . POCT A1C    Meds ordered this encounter  Medications  . Insulin Glargine (LANTUS SOLOSTAR) 100 UNIT/ML Solostar Pen    Sig: Inject 30 Units into the skin at bedtime.    Dispense:  15 mL    Refill:  2  . insulin aspart (NOVOLOG) 100 UNIT/ML injection    Sig: Please inject 5U once daily with your biggest meal.    Dispense:  2 vial    Refill:  2    Order Specific Question:  Lot Number?    Answer:   [id number]    Order Specific Question:  Expiration Date?    Answer:  07/04/2016    Order Specific Question:  NDC    Answer:  [id number]    Order Specific Question:  Quantity    Answer:  1  . medroxyPROGESTERone (DEPO-PROVERA) injection 150 mg    Sig:      Luiz Blare, DO 11/09/2015, 4:05 PM PGY-2, [place]

## 2015-11-09 NOTE — Patient Instructions (Signed)
I am pleased to hear that things are going well for you.  Here are some of the things we discussed today: -A1c today was 5.8!! This is great. Goal is <6.5 -Reduced your insulin to Lantus 30U and Novolog 5U -Depo injection given today; next injection Feb. 20 - Feb 08, 2016  New medications: None  Please schedule a follow-up appointment for one month or sooner to follow-up DM on new regimen   Thanks for allowing me to be a part of your care! Dr. Gerarda Fraction

## 2015-11-13 ENCOUNTER — Encounter: Payer: Self-pay | Admitting: Obstetrics and Gynecology

## 2015-11-13 NOTE — Assessment & Plan Note (Signed)
Depo injection given today; next injection Feb. 20 - Feb 08, 2016

## 2015-11-13 NOTE — Assessment & Plan Note (Signed)
A1c today 5.8. Patient doing well without side effects. Denies hypoglycemia. Has not had any new lifestyle changes per patient. Will adjust regimen to Lantus 30U qhs, Novolog 5U with largest meal(super), and metformin 100mg  BID. Patient to follow-up in about 4 weeks to see how in clinic and call if sugars are not well controlled on this regimen.

## 2015-11-17 ENCOUNTER — Emergency Department (HOSPITAL_COMMUNITY)
Admission: EM | Admit: 2015-11-17 | Discharge: 2015-11-17 | Disposition: A | Discharge status: Home / Self Care | Payer: Medicaid Other | Attending: Emergency Medicine | Admitting: Emergency Medicine

## 2015-11-17 ENCOUNTER — Emergency Department (HOSPITAL_COMMUNITY): Payer: Medicaid Other

## 2015-11-17 ENCOUNTER — Encounter (HOSPITAL_COMMUNITY): Payer: Self-pay | Admitting: Nurse Practitioner

## 2015-11-17 DIAGNOSIS — F17210 Nicotine dependence, cigarettes, uncomplicated: Secondary | ICD-10-CM | POA: Insufficient documentation

## 2015-11-17 DIAGNOSIS — Z8719 Personal history of other diseases of the digestive system: Secondary | ICD-10-CM | POA: Insufficient documentation

## 2015-11-17 DIAGNOSIS — Z794 Long term (current) use of insulin: Secondary | ICD-10-CM | POA: Diagnosis not present

## 2015-11-17 DIAGNOSIS — Z79899 Other long term (current) drug therapy: Secondary | ICD-10-CM | POA: Insufficient documentation

## 2015-11-17 DIAGNOSIS — Z7984 Long term (current) use of oral hypoglycemic drugs: Secondary | ICD-10-CM | POA: Insufficient documentation

## 2015-11-17 DIAGNOSIS — Z8632 Personal history of gestational diabetes: Secondary | ICD-10-CM | POA: Insufficient documentation

## 2015-11-17 DIAGNOSIS — R0981 Nasal congestion: Secondary | ICD-10-CM | POA: Diagnosis present

## 2015-11-17 DIAGNOSIS — J069 Acute upper respiratory infection, unspecified: Secondary | ICD-10-CM | POA: Diagnosis not present

## 2015-11-17 NOTE — Discharge Instructions (Signed)
Upper Respiratory Infection, Adult Most upper respiratory infections (URIs) are a viral infection of the air passages leading to the lungs. A URI affects the nose, throat, and upper air passages. The most common type of URI is nasopharyngitis and is typically referred to as "the common cold." URIs run their course and usually go away on their own. Most of the time, a URI does not require medical attention, but sometimes a bacterial infection in the upper airways can follow a viral infection. This is called a secondary infection. Sinus and middle ear infections are common types of secondary upper respiratory infections. Bacterial pneumonia can also complicate a URI. A URI can worsen asthma and chronic obstructive pulmonary disease (COPD). Sometimes, these complications can require emergency medical care and may be life threatening.  CAUSES Almost all URIs are caused by viruses. A virus is a type of germ and can spread from one person to another.  RISKS FACTORS You may be at risk for a URI if:   You smoke.   You have chronic heart or lung disease.  You have a weakened defense (immune) system.   You are very young or very old.   You have nasal allergies or asthma.  You work in crowded or poorly ventilated areas.  You work in health care facilities or schools. SIGNS AND SYMPTOMS  Symptoms typically develop 2-3 days after you come in contact with a cold virus. Most viral URIs last 7-10 days. However, viral URIs from the influenza virus (flu virus) can last 14-18 days and are typically more severe. Symptoms may include:   Runny or stuffy (congested) nose.   Sneezing.   Cough.   Sore throat.   Headache.   Fatigue.   Fever.   Loss of appetite.   Pain in your forehead, behind your eyes, and over your cheekbones (sinus pain).  Muscle aches.  DIAGNOSIS  Your health care provider may diagnose a URI by:  Physical exam.  Tests to check that your symptoms are not due to  another condition such as:  Strep throat.  Sinusitis.  Pneumonia.  Asthma. TREATMENT  A URI goes away on its own with time. It cannot be cured with medicines, but medicines may be prescribed or recommended to relieve symptoms. Medicines may help:  Reduce your fever.  Reduce your cough.  Relieve nasal congestion. HOME CARE INSTRUCTIONS   Take medicines only as directed by your health care provider.   Gargle warm saltwater or take cough drops to comfort your throat as directed by your health care provider.  Use a warm mist humidifier or inhale steam from a shower to increase air moisture. This may make it easier to breathe.  Drink enough fluid to keep your urine clear or pale yellow.   Eat soups and other clear broths and maintain good nutrition.   Rest as needed.   Return to work when your temperature has returned to normal or as your health care provider advises. You may need to stay home longer to avoid infecting others. You can also use a face mask and careful hand washing to prevent spread of the virus.  Increase the usage of your inhaler if you have asthma.   Do not use any tobacco products, including cigarettes, chewing tobacco, or electronic cigarettes. If you need help quitting, ask your health care provider. PREVENTION  The best way to protect yourself from getting a cold is to practice good hygiene.   Avoid oral or hand contact with people with cold   symptoms.   Wash your hands often if contact occurs.  There is no clear evidence that vitamin C, vitamin E, echinacea, or exercise reduces the chance of developing a cold. However, it is always recommended to get plenty of rest, exercise, and practice good nutrition.  SEEK MEDICAL CARE IF:   You are getting worse rather than better.   Your symptoms are not controlled by medicine.   You have chills.  You have worsening shortness of breath.  You have brown or red mucus.  You have yellow or brown nasal  discharge.  You have pain in your face, especially when you bend forward.  You have a fever.  You have swollen neck glands.  You have pain while swallowing.  You have white areas in the back of your throat. SEEK IMMEDIATE MEDICAL CARE IF:   You have severe or persistent:  Headache.  Ear pain.  Sinus pain.  Chest pain.  You have chronic lung disease and any of the following:  Wheezing.  Prolonged cough.  Coughing up blood.  A change in your usual mucus.  You have a stiff neck.  You have changes in your:  Vision.  Hearing.  Thinking.  Mood. MAKE SURE YOU:   Understand these instructions.  Will watch your condition.  Will get help right away if you are not doing well or get worse.   This information is not intended to replace advice given to you by your health care provider. Make sure you discuss any questions you have with your health care provider.   Document Released: 05/17/2001 Document Revised: 04/07/2015 Document Reviewed: 02/26/2014 Elsevier Interactive Patient Education 2016 [employer]  

## 2015-11-17 NOTE — ED Notes (Signed)
C/o 1 week history of nasal congestion, cough, body aches. Denies fevers.

## 2015-11-17 NOTE — ED Provider Notes (Signed)
CSN: [id number]     Arrival date & time 11/17/15  1111 History  This chart was scribed for non-physician practitioner, Amie Portland, PA-C, working with Quintella Reichert, MD by Terressa Koyanagi, ED Scribe. This patient was seen in room TR06C/TR06C and the patient's care was started at 2:27 PM.  Chief Complaint  Patient presents with  . URI   The history is provided by the patient. No language interpreter was used.   PCP: Luiz Blare, DO HPI Comments: Britne Borelli is a 39 y.o. female, with PMHx noted below including daily tobacco use (0.5 ppd), who presents to the Emergency Department complaining of nasal congestion with associated productive cough with yellow and occasionally blood tinged sputum, intermittent sore throat and body aches onset one week ago. She denies purulent nasal drainage. She states that her throat is only sore in the mornings and resolves after drinking something hot. She states that her body aches are mostly located over her back and upper extremities. Pt denies taking any measures at home to alleviate her sx. Pt reports her daughter is currently sick with similar sx and was evaluated in pediatric ED before her. She was diagnosed with URI and given albuterol inhaler. Pt denies fever, chills, headache, ear pain, eye pain, eye redness, eye discharge, chest pain, SOB, abdominal pain, n/v/d, numbness/weakness of the extremities or any other Sx at this time.   Past Medical History  Diagnosis Date  . History of gestational diabetes in prior pregnancy, currently pregnant   . Gestational diabetes mellitus, antepartum 02/14/2013  . Cholelithiasis   . Gestational diabetes     insulin  . Hypertension     preeclampsia with previous pregnancy   Past Surgical History  Procedure Laterality Date  . Tubal ligation      2007   Family History  Problem Relation Age of Onset  . Heart disease Mother   . Hypertension Mother   . Diabetes Mother   . Hypertension Father   . Diabetes Father     Social History  Substance Use Topics  . Smoking status: Current Every Day Smoker -- 0.50 packs/day    Types: Cigarettes  . Smokeless tobacco: Never Used  . Alcohol Use: No   OB History    Gravida Para Term Preterm AB TAB SAB Ectopic Multiple Living   _0 0 0 0 0 0 4     Review of Systems  HENT: Positive for congestion and sore throat.   Respiratory: Positive for cough.   Musculoskeletal: Positive for myalgias.  All other systems reviewed and are negative.  Allergies  Review of patient's allergies indicates no known allergies.  Home Medications   Prior to Admission medications   Medication Sig Start Date End Date Taking? Authorizing Provider  ACCU-CHEK FASTCLIX LANCETS MISC 1 each by Does not apply route 4 (four) times daily -  before meals and at bedtime. Check sugar 6 x daily 03/09/15   Katheren Shams, DO  Albiglutide 30 MG PEN Inject 55m SQ once weekly. 04/02/15   WZenia Resides MD  amLODipine (NORVASC) 5 MG tablet Take 1 tablet (5 mg total) by mouth daily. 03/25/15   JKatheren Shams DO  atorvastatin (LIPITOR) 40 MG tablet Take 1 tablet (40 mg total) by mouth daily. 05/19/15   JKatheren Shams DO  B-D INS SYR ULTRAFINE 1CC/30G 30G X 1/2" 1 ML MISC USE AS INDICATED WITH INSULIN. 10/02/15   JKatheren Shams DO  Blood Glucose Monitoring Suppl (ACCU-CHEK NANO  SMARTVIEW) W/DEVICE KIT 1 Device by Does not apply route 4 (four) times daily -  before meals and at bedtime. 03/09/15   Katheren Shams, DO  insulin aspart (NOVOLOG) 100 UNIT/ML injection Please inject 5U once daily with your biggest meal. 11/09/15   Katheren Shams, DO  Insulin Glargine (LANTUS SOLOSTAR) 100 UNIT/ML Solostar Pen Inject 30 Units into the skin at bedtime. 11/09/15   Katheren Shams, DO  Insulin Pen Needle (B-D ULTRAFINE III SHORT PEN) 31G X 8 MM MISC 1 Container by Does not apply route 3 (three) times daily. 04/02/15   Zenia Resides, MD  lisinopril (PRINIVIL,ZESTRIL) 20 MG tablet Take 1 tablet (20 mg total) by  mouth daily. 05/25/15   Katheren Shams, DO  medroxyPROGESTERone (DEPO-PROVERA) 150 MG/ML injection Inject 1 mL (150 mg total) into the muscle every 3 (three) months. 05/10/13   Deirdre Freida Busman, CNM  metFORMIN (GLUCOPHAGE) 1000 MG tablet Take 1 tablet (1,000 mg total) by mouth 2 (two) times daily with a meal. 03/25/15   Katheren Shams, DO  nystatin (MYCOSTATIN/NYSTOP) 100000 UNIT/GM POWD Apply to infected area three times daily. Patient not taking: Reported on 04/13/2015 03/09/15   Katheren Shams, DO  simvastatin (ZOCOR) 40 MG tablet Take 1 tablet (40 mg total) by mouth at bedtime. 03/09/15   Katheren Shams, DO   Triage Vitals: BP 144/101 mmHg  Pulse 79  Temp(Src) 98.2 F (36.8 C) (Oral)  Resp 16  Ht 6' (1.829 m)  Wt 290 lb 6.4 oz (131.725 kg)  BMI 39.38 kg/m2  SpO2 97% Physical Exam  Constitutional: She appears well-developed and well-nourished. No distress.  HENT:  Head: Normocephalic and atraumatic.  Right Ear: Tympanic membrane, external ear and ear canal normal.  Left Ear: Tympanic membrane, external ear and ear canal normal.  Nose: Nose normal. No mucosal edema or rhinorrhea. Right sinus exhibits no maxillary sinus tenderness and no frontal sinus tenderness. Left sinus exhibits no maxillary sinus tenderness and no frontal sinus tenderness.  Mouth/Throat: Uvula is midline, oropharynx is clear and moist and mucous membranes are normal. Mucous membranes are not dry. No uvula swelling. No oropharyngeal exudate, posterior oropharyngeal edema or posterior oropharyngeal erythema.  Eyes: Conjunctivae are normal. Right eye exhibits no discharge. Left eye exhibits no discharge. No scleral icterus.  Neck: Normal range of motion. Neck supple.  Cardiovascular: Normal rate and normal heart sounds.   Pulmonary/Chest: Effort normal and breath sounds normal. She has no wheezes. She has no rales.  Abdominal: Soft. There is no tenderness.  Musculoskeletal: Normal range of motion.  Moves all extremities  spontaneously and walks with a steady gait. No TTP over major joints or back.   Lymphadenopathy:    She has no cervical adenopathy.  Neurological: She is alert. Coordination normal.  Skin: Skin is warm and dry. She is not diaphoretic.  Psychiatric: She has a normal mood and affect. Her behavior is normal.  Nursing note and vitals reviewed.   ED Course  Procedures (including critical care time) DIAGNOSTIC STUDIES: Oxygen Saturation is 97% on ra, nl by my interpretation.    COORDINATION OF CARE: 12:45 PM: Discussed treatment plan which includes imaging with pt at bedside; patient verbalizes understanding and agrees with treatment plan.  Imaging Review Dg Chest 2 View  11/17/2015  CLINICAL DATA:  Cough and sore throat EXAM: CHEST  2 VIEW COMPARISON:  None. FINDINGS: Lungs are clear. Heart size and pulmonary vascularity are normal. No adenopathy. No bone lesions. IMPRESSION:  No edema or consolidation. Electronically Signed   By: Lowella Grip III M.D.   On: 11/17/2015 13:37   I have personally reviewed and evaluated these images as part of my medical decision-making.  MDM    Final diagnoses:  URI (upper respiratory infection)   Patient presenting with nasal congestion with associated productive cough, intermittent sore throat and myalgias x 7 days. VSS. Pt is nontoxic appearing. No nasal musosal edema noted. TMs pearly gray without erythema or effusion. Oropharynx without erythema, edema or exudate. Lungs CTAB. CXR negative for acute infiltrate. Patients symptoms are consistent with URI, likely viral etiology with known sick contact. Discussed that antibiotics are not indicated for viral infections. Pt will be discharged with symptomatic treatment. Verbalizes understanding and is agreeable with plan. Pt is hemodynamically stable & in NAD prior to dc.  I personally performed the services described in this documentation, which was scribed in my presence. The recorded information has  been reviewed and is accurate.  Filed Vitals:   11/17/15 1150 11/17/15 1153 11/17/15 1333  BP:  144/101 151/92  Pulse:  79 78  Temp:  98.2 F (36.8 C)   TempSrc:  Oral   Resp:  16 16  Height: 6' (1.829 m)    Weight: 131.725 kg    SpO2:  97% 100%     Josephina Gip, PA-C 11/17/15 [place], MD 11/18/15 [phone]

## 2015-12-29 ENCOUNTER — Ambulatory Visit: Payer: Medicaid Other | Admitting: Obstetrics and Gynecology

## 2016-01-25 ENCOUNTER — Ambulatory Visit (INDEPENDENT_AMBULATORY_CARE_PROVIDER_SITE_OTHER): Payer: Medicaid Other | Admitting: *Deleted

## 2016-01-25 DIAGNOSIS — Z23 Encounter for immunization: Secondary | ICD-10-CM

## 2016-01-25 DIAGNOSIS — Z3042 Encounter for surveillance of injectable contraceptive: Secondary | ICD-10-CM

## 2016-01-25 MED ORDER — MEDROXYPROGESTERONE ACETATE 150 MG/ML IM SUSP
150.0000 mg | Freq: Once | INTRAMUSCULAR | Status: AC
  Administered 2016-01-25: 150 mg via INTRAMUSCULAR

## 2016-01-25 NOTE — Progress Notes (Signed)
   Pt in for Depo Provera injection.  Pt tolerated Depo injection. Depo given left upper outer quadrant.  Next injection due May 8-22, 2017.  Reminder card given. Derl Barrow, RN

## 2016-01-27 ENCOUNTER — Ambulatory Visit (INDEPENDENT_AMBULATORY_CARE_PROVIDER_SITE_OTHER): Payer: Medicaid Other | Admitting: Obstetrics and Gynecology

## 2016-01-27 ENCOUNTER — Encounter: Payer: Self-pay | Admitting: Obstetrics and Gynecology

## 2016-01-27 VITALS — BP 171/90 | HR 81 | Temp 99.0°F | Wt 292.7 lb

## 2016-01-27 DIAGNOSIS — I10 Essential (primary) hypertension: Secondary | ICD-10-CM | POA: Diagnosis not present

## 2016-01-27 DIAGNOSIS — E118 Type 2 diabetes mellitus with unspecified complications: Secondary | ICD-10-CM | POA: Diagnosis not present

## 2016-01-27 MED ORDER — ASPIRIN EC 81 MG PO TBEC: 81.0000 mg | DELAYED_RELEASE_TABLET | Freq: Every day | ORAL | Status: DC

## 2016-01-27 MED ORDER — LISINOPRIL 20 MG PO TABS: 20.0000 mg | ORAL_TABLET | Freq: Every day | ORAL | Status: DC

## 2016-01-27 NOTE — Patient Instructions (Addendum)
I am pleased to hear that things are going well for you.  Here are some of the things we discussed today: -Reducing Lantus down to 15U once daily. Continue other medications -Restarted BP medication; only one pill Lisinopril. Can go up on dose if needed -Start taking a baby aspirin  -Rx for medical ID bracelet given -You declined the flu today.  Please schedule a follow-up appointment for 3 weeks for diabetes recheck   Thanks for allowing me to be a part of your care! Dr. Gerarda Fraction

## 2016-01-27 NOTE — Progress Notes (Signed)
     Subjective: Chief Complaint  Patient presents with  . Follow-up    DM     HPI: Darlene Bennett is a 40 y.o. presenting to clinic today to discuss the following:  #Hypertension Blood pressure at home: Does not monitor Blood pressure today: elevated x2 Currently uncontrolled Taking Meds: No, patient stopped taking all her BP medications. States they were making her feel nasueous ROS: Denies headache, dizziness, visual changes, chest pain, abdominal pain, or shortness of breath.  #Diabetes:  Sugars - High at home: 180    Low at home: 89     Fasting this AM 121 Taking medications: yes, Lantus 30U qhs, Novolog 5U with largest meal(super), and metformin 1000mg  BID Side effects: denies, no hypoglycemia On Aspirin - No On statin - Yes Also states she uses cinnamon capsules to help with her diabtes Last eye exam: not had one ROS: denies fever, chills, polyuria, polydipsia, numbness or tingling in extremities  #Requesting information on medical braclet   ROS noted in HPI.  Past Medical, Surgical, Social, and Family History Reviewed & Updated per EMR.   Objective: BP 171/90 mmHg  Pulse 81  Temp(Src) 99 F (37.2 C) (Oral)  Wt 292 lb 11.2 oz (132.768 kg) Vitals and nursing notes reviewed  Physical Exam  Constitutional: She is well-developed, well-nourished, and in no distress.  Cardiovascular: Normal rate, regular rhythm, normal heart sounds and intact distal pulses.   No murmur heard. Pulmonary/Chest: Effort normal and breath sounds normal.  Abdominal: Soft. There is no tenderness.  Musculoskeletal: Normal range of motion. She exhibits no edema.    Assessment/Plan: Essential hypertension Patient non-compliant with BP medication regimen. Had discontinued use without notifying provider. Will restart lisinopril at 20mg . Will likely need to titrate up. She was previously on two agents. Starting low to monitor for side effects. Discussed need to monitor BPs at home. Advised on  risk of increased pressures with patient and complications. Started patient on baby aspirin.   Type 2 diabetes mellitus Well-controlled sugars according to patient's numbers. Next A1c in 1 month and believe it will be <7, at goal. Will reduce patient's regimen to Lantus 15U at bedtime. Continue Novolog with greatest meal and metformin.    -Paper Rx for medical ID bracelet given  -Declined flu vaccine today    Meds ordered this encounter  Medications  . aspirin EC 81 MG tablet    Sig: Take 1 tablet (81 mg total) by mouth daily.    Dispense:  30 tablet    Refill:  3  . lisinopril (PRINIVIL,ZESTRIL) 20 MG tablet    Sig: Take 1 tablet (20 mg total) by mouth daily.    Dispense:  30 tablet    Refill:  [place], DO 01/27/2016, 2:26 PM PGY-2, [place]

## 2016-01-29 MED ORDER — INSULIN GLARGINE 100 UNIT/ML SOLOSTAR PEN: 15.0000 [IU] | PEN_INJECTOR | Freq: Every day | SUBCUTANEOUS | Status: DC

## 2016-01-29 NOTE — Assessment & Plan Note (Addendum)
Well-controlled sugars according to patient's numbers. Next A1c in 1 month and believe it will be <7, at goal. Will reduce patient's regimen to Lantus 15U at bedtime. Continue Novolog with greatest meal and metformin.

## 2016-01-29 NOTE — Assessment & Plan Note (Signed)
Patient non-compliant with BP medication regimen. Had discontinued use without notifying provider. Will restart lisinopril at 20mg . Will likely need to titrate up. She was previously on two agents. Starting low to monitor for side effects. Discussed need to monitor BPs at home. Advised on risk of increased pressures with patient and complications. Started patient on baby aspirin.

## 2016-02-17 ENCOUNTER — Ambulatory Visit (INDEPENDENT_AMBULATORY_CARE_PROVIDER_SITE_OTHER): Payer: Medicaid Other | Admitting: Obstetrics and Gynecology

## 2016-02-17 ENCOUNTER — Encounter: Payer: Self-pay | Admitting: Obstetrics and Gynecology

## 2016-02-17 VITALS — BP 139/102 | HR 89 | Temp 98.2°F | Wt 297.5 lb

## 2016-02-17 DIAGNOSIS — E118 Type 2 diabetes mellitus with unspecified complications: Secondary | ICD-10-CM | POA: Diagnosis not present

## 2016-02-17 DIAGNOSIS — Z0000 Encounter for general adult medical examination without abnormal findings: Secondary | ICD-10-CM | POA: Diagnosis not present

## 2016-02-17 DIAGNOSIS — Z23 Encounter for immunization: Secondary | ICD-10-CM

## 2016-02-17 DIAGNOSIS — F17200 Nicotine dependence, unspecified, uncomplicated: Secondary | ICD-10-CM

## 2016-02-17 DIAGNOSIS — I10 Essential (primary) hypertension: Secondary | ICD-10-CM | POA: Diagnosis not present

## 2016-02-17 LAB — POCT GLYCOSYLATED HEMOGLOBIN (HGB A1C): HEMOGLOBIN A1C: 6.9

## 2016-02-17 MED ORDER — BLOOD PRESSURE KIT: PACK | Status: DC

## 2016-02-17 NOTE — Progress Notes (Signed)
     Subjective: Chief Complaint  Patient presents with  . Follow-up    3 mth check for DM;HTN     HPI: Darlene Bennett is a 40 y.o. presenting to clinic today to discuss the following:  #Diabetes:  Sugars - High at home: 150     Low at home(fasting): wake up in morning 130-140, this am 96 Taking medications: Metformin, Lantus, Novolog  Side effects: no hypoglycemia On Aspirin On statin Last eye exam: unknown ROS: denies fever, chills, dizziness, LOC, polyuria, polydipsia, numbness or tingling in extremities or chest pain, visual disturbances.  #Hypertension Blood pressure at home: does not monitor but wanting to know if she can get a blood pressure kit to monitor Blood pressure today: elevated Patient admits that she just took her BP medications less than 30 minutes before coming in Taking Meds: lisinopril and amlodipine Side effects: none ROS: Denies headache, dizziness, visual changes, nausea, vomiting, chest pain, abdominal pain or shortness of breath.  #Tobacco use -Pack of cigs last 3 days -Trying to quit but says its hard - on scale of 1-10 for readiness 4   Health Maintenance: Tdap    ROS noted in HPI.  Past Medical, Surgical, Social, and Family History Reviewed & Updated per EMR.   Objective: BP 139/102 mmHg  Pulse 89  Temp(Src) 98.2 F (36.8 C) (Oral)  Wt 297 lb 8 oz (134.945 kg) Vitals and nursing notes reviewed  Physical Exam  Constitutional: She is well-developed, well-nourished, and in no distress.  Cardiovascular: Normal rate, regular rhythm and normal heart sounds.   Pulmonary/Chest: Effort normal and breath sounds normal.  Abdominal: Soft. Bowel sounds are normal. There is no tenderness.  Musculoskeletal: She exhibits no edema.    Assessment/Plan: Type 2 diabetes mellitus A1c today 6.9. Goal <7. She is well-controlled and asymptomatic. Will increase Lantus to 20U. Other medications the same. Referral placed for opthalmology evaluation.     Tobacco use disorder Continues to smoke and not ready to quit. Discussed when patient ready to come back for intervention. Quit line information given. Encouraged cessation.   Preventative health care Tdap given today. Referral for eye exam placed.  Essential hypertension BPs elevated today. Patient with poor compliance in the past. Recently took BP medication which may not be in effect yet. Continue current doses of lisinopril and amlodipine. Order placed for blood pressure monitoring kit. Continue baby aspirin. Dicussed warning precautions of elevated pressure.    Orders Placed This Encounter  Procedures  . Tdap vaccine greater than or equal to 7yo IM  . Ambulatory referral to Ophthalmology    Referral Priority:  Routine    Referral Type:  Consultation    Referral Reason:  Specialty Services Required    Requested Specialty:  Ophthalmology    Number of Visits Requested:  1  . POCT glycosylated hemoglobin (Hb A1C)    Meds ordered this encounter  Medications  . Blood Pressure KIT    Sig: Use device to monitor blood pressures daily.    Dispense:  1 each    Refill:  0     Jazma Phelps, DO 02/17/2016, 10:54 AM PGY-2,  Family Medicine   

## 2016-02-17 NOTE — Patient Instructions (Addendum)
I am pleased to hear that things are going well for you.  Here are some of the things we discussed today: -A1c today was 6.9 -Lets increase Lantus to 20U -Blood pressure monitor sent to pharmacy -referral placed for eye doctor -Tdap vaccine given today -consider quitting smoking to help overall health  Please schedule a follow-up appointment for 3 months or sooner as needed  Thanks for allowing me to be a part of your care! Dr. Gerarda Fraction   Tdap Vaccine (Tetanus, Diphtheria and Pertussis): What You Need to Know 1. Why get vaccinated? Tetanus, diphtheria and pertussis are very serious diseases. Tdap vaccine can protect [place] from these diseases. And, Tdap vaccine given to pregnant women can protect newborn babies against pertussis. TETANUS (Lockjaw) is rare in the [place] today. It causes painful muscle tightening and stiffness, usually all over the body.  It can lead to tightening of muscles in the head and neck so you can't open your mouth, swallow, or sometimes even breathe. Tetanus kills about 1 out of 10 people who are infected even after receiving the best medical care. DIPHTHERIA is also rare in the [place] today. It can cause a thick coating to form in the back of the throat.  It can lead to breathing problems, heart failure, paralysis, and death. PERTUSSIS (Whooping Cough) causes severe coughing spells, which can cause difficulty breathing, vomiting and disturbed sleep.  It can also lead to weight loss, incontinence, and rib fractures. Up to 2 in 100 adolescents and 5 in 100 adults with pertussis are hospitalized or have complications, which could include pneumonia or death. These diseases are caused by bacteria. Diphtheria and pertussis are spread from person to person through secretions from coughing or sneezing. Tetanus enters the body through cuts, scratches, or wounds. Before vaccines, as many as 200,000 cases of diphtheria, 200,000 cases of pertussis, and hundreds of  cases of tetanus, were reported in the [place] each year. Since vaccination began, reports of cases for tetanus and diphtheria have dropped by about 99% and for pertussis by about 80%. 2. Tdap vaccine Tdap vaccine can protect adolescents and adults from tetanus, diphtheria, and pertussis. One dose of Tdap is routinely given at age 50 or 73. People who did not get Tdap at that age should get it as soon as possible. Tdap is especially important for healthcare professionals and anyone having close contact with a baby younger than 12 months. Pregnant women should get a dose of Tdap during every pregnancy, to protect the newborn from pertussis. Infants are most at risk for severe, life-threatening complications from pertussis. Another vaccine, called Td, protects against tetanus and diphtheria, but not pertussis. A Td booster should be given every 10 years. Tdap may be given as one of these boosters if you have never gotten Tdap before. Tdap may also be given after a severe cut or burn to prevent tetanus infection. Your doctor or the person giving you the vaccine can give you more information. Tdap may safely be given at the same time as other vaccines. 3. Some people should not get this vaccine  A person who has ever had a life-threatening allergic reaction after a previous dose of any diphtheria, tetanus or pertussis containing vaccine, OR has a severe allergy to any part of this vaccine, should not get Tdap vaccine. Tell the person giving the vaccine about any severe allergies.  Anyone who had coma or long repeated seizures within 7 days after a childhood dose of DTP or DTaP,  or a previous dose of Tdap, should not get Tdap, unless a cause other than the vaccine was found. They can still get Td.  Talk to your doctor if you:  have seizures or another nervous system problem,  had severe pain or swelling after any vaccine containing diphtheria, tetanus or pertussis,  ever had a condition called  Guillain-Barr Syndrome (GBS),  aren't feeling well on the day the shot is scheduled. 4. Risks With any medicine, including vaccines, there is a chance of side effects. These are usually mild and go away on their own. Serious reactions are also possible but are rare. Most people who get Tdap vaccine do not have any problems with it. Mild problems following Tdap (Did not interfere with activities)  Pain where the shot was given (about 3 in 4 adolescents or 2 in 3 adults)  Redness or swelling where the shot was given (about 1 person in 5)  Mild fever of at least 100.76F (up to about 1 in 25 adolescents or 1 in 100 adults)  Headache (about 3 or 4 people in 10)  Tiredness (about 1 person in 3 or 4)  Nausea, vomiting, diarrhea, stomach ache (up to 1 in 4 adolescents or 1 in 10 adults)  Chills, sore joints (about 1 person in 10)  Body aches (about 1 person in 3 or 4)  Rash, swollen glands (uncommon) Moderate problems following Tdap (Interfered with activities, but did not require medical attention)  Pain where the shot was given (up to 1 in 5 or 6)  Redness or swelling where the shot was given (up to about 1 in 16 adolescents or 1 in 12 adults)  Fever over 102F (about 1 in 100 adolescents or 1 in 250 adults)  Headache (about 1 in 7 adolescents or 1 in 10 adults)  Nausea, vomiting, diarrhea, stomach ache (up to 1 or 3 people in 100)  Swelling of the entire arm where the shot was given (up to about 1 in 500). Severe problems following Tdap (Unable to perform usual activities; required medical attention)  Swelling, severe pain, bleeding and redness in the arm where the shot was given (rare). Problems that could happen after any vaccine:  People sometimes faint after a medical procedure, including vaccination. Sitting or lying down for about 15 minutes can help prevent fainting, and injuries caused by a fall. Tell your doctor if you feel dizzy, or have vision changes or ringing  in the ears.  Some people get severe pain in the shoulder and have difficulty moving the arm where a shot was given. This happens very rarely.  Any medication can cause a severe allergic reaction. Such reactions from a vaccine are very rare, estimated at fewer than 1 in a million doses, and would happen within a few minutes to a few hours after the vaccination. As with any medicine, there is a very remote chance of a vaccine causing a serious injury or death. The safety of vaccines is always being monitored. For more information, visit: http://www.aguilar.org/ 5. What if there is a serious problem? What should I look for?  Look for anything that concerns you, such as signs of a severe allergic reaction, very high fever, or unusual behavior.  Signs of a severe allergic reaction can include hives, swelling of the face and throat, difficulty breathing, a fast heartbeat, dizziness, and weakness. These would usually start a few minutes to a few hours after the vaccination. What should I do?  If you think it is  a severe allergic reaction or other emergency that can't wait, call 9-1-1 or get the person to the nearest hospital. Otherwise, call your doctor.  Afterward, the reaction should be reported to the Vaccine Adverse Event Reporting System (VAERS). Your doctor might file this report, or you can do it yourself through the VAERS web site at www.vaers.SamedayNews.es, or by calling (832) 059-3354. VAERS does not give medical advice.  6. The National Vaccine Injury Compensation Program The [employer] Vaccine Injury Compensation Program (VICP) is a federal program that was created to compensate people who may have been injured by certain vaccines. Persons who believe they may have been injured by a vaccine can learn about the program and about filing a claim by calling (760) 684-5858 or visiting the [place] website at GoldCloset.com.ee. There is a time limit to file a claim for compensation. 7.  How can I learn more?  Ask your doctor. He or she can give you the vaccine package insert or suggest other sources of information.  Call your local or state health department.  Contact the Centers for Disease Control and Prevention (CDC):  Call (904)499-1111 (1-800-CDC-INFO) or  Visit CDC's website at http://hunter.com/ CDC Tdap Vaccine VIS (01/28/14)   This information is not intended to replace advice given to you by your health care provider. Make sure you discuss any questions you have with your health care provider.   Document Released: 05/22/2012 Document Revised: 12/12/2014 Document Reviewed: 03/05/2014 Elsevier Interactive Patient Education [employer].

## 2016-02-20 DIAGNOSIS — Z0000 Encounter for general adult medical examination without abnormal findings: Secondary | ICD-10-CM | POA: Insufficient documentation

## 2016-02-20 NOTE — Assessment & Plan Note (Signed)
A1c today 6.9. Goal <7. She is well-controlled and asymptomatic. Will increase Lantus to 20U. Other medications the same. Referral placed for opthalmology evaluation.

## 2016-02-20 NOTE — Assessment & Plan Note (Signed)
BPs elevated today. Patient with poor compliance in the past. Recently took BP medication which may not be in effect yet. Continue current doses of lisinopril and amlodipine. Order placed for blood pressure monitoring kit. Continue baby aspirin. Dicussed warning precautions of elevated pressure. 

## 2016-02-20 NOTE — Assessment & Plan Note (Signed)
Tdap given today. Referral for eye exam placed.

## 2016-02-20 NOTE — Assessment & Plan Note (Signed)
Continues to smoke and not ready to quit. Discussed when patient ready to come back for intervention. Quit line information given. Encouraged cessation.

## 2016-03-28 ENCOUNTER — Other Ambulatory Visit: Payer: Self-pay | Admitting: Obstetrics and Gynecology

## 2016-03-28 MED ORDER — LISINOPRIL 20 MG PO TABS: 20.0000 mg | ORAL_TABLET | Freq: Every day | ORAL | Status: DC

## 2016-03-28 NOTE — Telephone Encounter (Signed)
Will forward to MD and covering provider for Dr. Gerarda Fraction. Kassadie Pancake,CMA

## 2016-03-28 NOTE — Telephone Encounter (Signed)
Need refill on her lisinopril

## 2016-03-28 NOTE — Telephone Encounter (Signed)
Rx filled. Patient will need follow up visit for lab draw and BP check for further refills.  Algis Greenhouse. Jerline Pain, [place] Resident PGY-2 03/28/2016 12:11 PM

## 2016-03-28 NOTE — Telephone Encounter (Signed)
LM for patient to call back.  Please assist her in scheduling an office visit for blood pressure follow up. Jazmin Hartsell,CMA

## 2016-04-08 ENCOUNTER — Other Ambulatory Visit: Payer: Self-pay | Admitting: Obstetrics and Gynecology

## 2016-04-11 ENCOUNTER — Ambulatory Visit (INDEPENDENT_AMBULATORY_CARE_PROVIDER_SITE_OTHER): Payer: Medicaid Other | Admitting: Family Medicine

## 2016-04-11 ENCOUNTER — Encounter: Payer: Self-pay | Admitting: Family Medicine

## 2016-04-11 ENCOUNTER — Ambulatory Visit: Payer: Medicaid Other

## 2016-04-11 VITALS — BP 148/88 | HR 90 | Temp 98.3°F | Ht 72.0 in | Wt 294.0 lb

## 2016-04-11 DIAGNOSIS — Z3042 Encounter for surveillance of injectable contraceptive: Secondary | ICD-10-CM | POA: Diagnosis not present

## 2016-04-11 DIAGNOSIS — E119 Type 2 diabetes mellitus without complications: Secondary | ICD-10-CM | POA: Diagnosis not present

## 2016-04-11 DIAGNOSIS — I10 Essential (primary) hypertension: Secondary | ICD-10-CM

## 2016-04-11 MED ORDER — MEDROXYPROGESTERONE ACETATE 150 MG/ML IM SUSP
150.0000 mg | Freq: Once | INTRAMUSCULAR | Status: AC
  Administered 2016-04-11: 150 mg via INTRAMUSCULAR

## 2016-04-11 MED ORDER — GLUCOSE BLOOD VI STRP: 1.0000 | ORAL_STRIP | Freq: Two times a day (BID) | Status: DC

## 2016-04-11 MED ORDER — LISINOPRIL 20 MG PO TABS: 20.0000 mg | ORAL_TABLET | Freq: Every day | ORAL | Status: DC

## 2016-04-11 NOTE — Assessment & Plan Note (Addendum)
-   Elevated to  148/88, above goal of 140/90 - Continue Lisinopril. Prescription printed and given to patient. Discussed that she can get 90 day supply at [facility] for free. [facility] only located in [place] and [place], but states she can get transportation to either location. States she should have no problem picking up her prescription. - Follow up in one month to see PCP. To re-evaluate blood pressure on medication to determine if further medication needed.

## 2016-04-11 NOTE — Addendum Note (Signed)
Addended by: Katharina Caper, Kristi Hyer D on: 04/11/2016 03:59 PM   Modules accepted: Orders

## 2016-04-11 NOTE — Patient Instructions (Addendum)
Thank you so much for coming to visit me today! I have printed a prescription for Lisinopril. You may take this to [facility] for a free 90 day subscription. Please follow up in one month with Dr. Gerarda Fraction to monitor your blood pressure on medication.  Thanks again! Dr. Gerlean Ren

## 2016-04-11 NOTE — Progress Notes (Signed)
Subjective:     Patient ID: Darlene Bennett, female   DOB: 11-17-1976, 40 y.o.   MRN: [id number]  HPI Darlene Bennett is a 40yo female presenting today for hypertension follow up. Is also scheduled for next Depo shot today, to be given by nursing. - Denies taking Amlodipine. Initially stated it was discontinued by Dr. Gerarda Fraction after it made her sick. Then clarified that she had stopped it herself and told Dr. Gerarda Fraction at last office visit. Has not taken it for a few months. - Prescribed Lisinopril 20mg  daily. States she is unable to afford the $3 copay and has not taken it on Wednesday 5/3.  - Denies headache, shortness of breath, or chest pain currently. Reports having headache 2 days ago, resolved after taking Vinegar, consistent with prior headaches.  - Requests refill of test strips - Currently smokes 1/2 of cigarettes every 2-3 days. States she splits a pack with someone else.   Review of Systems Per HPI. Other systems negative.    Objective:   Physical Exam  Constitutional: She appears well-developed and well-nourished. No distress.  HENT:  Head: Normocephalic and atraumatic.  Eyes: Pupils are equal, round, and reactive to light.  Cardiovascular: Normal rate and regular rhythm.  Exam reveals no gallop and no friction rub.   No murmur heard. Pulmonary/Chest: Effort normal. No respiratory distress. She has no wheezes.  Psychiatric: She has a normal mood and affect. Her behavior is normal.       Assessment:     Essential hypertension - Elevated to  148/88, above goal of 140/90 - Continue Lisinopril. Prescription printed and given to patient. Discussed that she can get 90 day supply at [facility] for free. [facility] only located in [place] and [place], but states she can get transportation to either location. States she should have no problem picking up her prescription. - Follow up in one month to see PCP. To re-evaluate blood pressure on medication to determine if further medication  needed.

## 2016-04-15 ENCOUNTER — Other Ambulatory Visit: Payer: Self-pay | Admitting: Obstetrics and Gynecology

## 2016-06-10 ENCOUNTER — Other Ambulatory Visit: Payer: Self-pay | Admitting: Family Medicine

## 2016-06-15 ENCOUNTER — Other Ambulatory Visit: Payer: Self-pay | Admitting: Obstetrics and Gynecology

## 2016-06-15 MED ORDER — LISINOPRIL 20 MG PO TABS: 20.0000 mg | ORAL_TABLET | Freq: Every day | ORAL | Status: DC

## 2016-06-15 NOTE — Telephone Encounter (Signed)
I have not received any refill request for Lisinopril.  Refill request sent to pharmacy.  Derl Barrow, RN

## 2016-06-15 NOTE — Telephone Encounter (Signed)
Pt has been calling and the pharmacy has faxed [place] several times for the patients Lisinopril. She has not been able to have this all week. Can we get this sent in today. jw

## 2016-06-17 MED ORDER — LISINOPRIL 20 MG PO TABS
20.0000 mg | ORAL_TABLET | Freq: Every day | ORAL | Status: DC
Start: 2016-06-17 — End: 2016-06-17

## 2016-06-17 MED ORDER — LISINOPRIL 20 MG PO TABS: 20.0000 mg | ORAL_TABLET | Freq: Every day | ORAL | Status: DC

## 2016-06-17 NOTE — Telephone Encounter (Signed)
Patient called stating her refill was sent to the wrong pharmacy.  Rx resent to [place] per patient request.  Kennon Holter, [place] C, CMA

## 2016-06-17 NOTE — Addendum Note (Signed)
Addended by: Junious Dresser on: 06/17/2016 12:17 PM   Modules accepted: Orders

## 2016-07-07 ENCOUNTER — Other Ambulatory Visit: Payer: Self-pay | Admitting: *Deleted

## 2016-07-08 MED ORDER — ASPIRIN EC 81 MG PO TBEC: 81.0000 mg | DELAYED_RELEASE_TABLET | Freq: Every day | ORAL | 11 refills | Status: DC

## 2016-08-02 ENCOUNTER — Encounter (HOSPITAL_COMMUNITY): Payer: Self-pay

## 2016-08-02 ENCOUNTER — Ambulatory Visit (INDEPENDENT_AMBULATORY_CARE_PROVIDER_SITE_OTHER): Payer: Medicaid Other | Admitting: Obstetrics and Gynecology

## 2016-08-02 ENCOUNTER — Encounter: Payer: Self-pay | Admitting: Obstetrics and Gynecology

## 2016-08-02 ENCOUNTER — Emergency Department (HOSPITAL_COMMUNITY)
Admission: EM | Admit: 2016-08-02 | Discharge: 2016-08-02 | Disposition: A | Discharge status: Home / Self Care | Payer: Medicaid Other | Attending: Emergency Medicine | Admitting: Emergency Medicine

## 2016-08-02 VITALS — BP 138/90 | HR 88 | Temp 98.7°F | Ht 72.0 in | Wt 285.0 lb

## 2016-08-02 DIAGNOSIS — L720 Epidermal cyst: Secondary | ICD-10-CM | POA: Diagnosis not present

## 2016-08-02 DIAGNOSIS — Z3009 Encounter for other general counseling and advice on contraception: Secondary | ICD-10-CM | POA: Diagnosis not present

## 2016-08-02 DIAGNOSIS — I10 Essential (primary) hypertension: Secondary | ICD-10-CM | POA: Diagnosis not present

## 2016-08-02 DIAGNOSIS — F17210 Nicotine dependence, cigarettes, uncomplicated: Secondary | ICD-10-CM | POA: Insufficient documentation

## 2016-08-02 DIAGNOSIS — Z7982 Long term (current) use of aspirin: Secondary | ICD-10-CM | POA: Diagnosis not present

## 2016-08-02 DIAGNOSIS — R51 Headache: Secondary | ICD-10-CM | POA: Diagnosis present

## 2016-08-02 DIAGNOSIS — E119 Type 2 diabetes mellitus without complications: Secondary | ICD-10-CM

## 2016-08-02 LAB — POCT GLYCOSYLATED HEMOGLOBIN (HGB A1C): Hemoglobin A1C: 6.3

## 2016-08-02 LAB — POCT URINE PREGNANCY: Preg Test, Ur: NEGATIVE

## 2016-08-02 MED ORDER — ASPIRIN EC 81 MG PO TBEC: 81.0000 mg | DELAYED_RELEASE_TABLET | Freq: Every day | ORAL | 3 refills | Status: DC

## 2016-08-02 MED ORDER — INSULIN GLARGINE 100 UNIT/ML SOLOSTAR PEN: 15.0000 [IU] | PEN_INJECTOR | Freq: Every day | SUBCUTANEOUS | 2 refills | Status: DC

## 2016-08-02 MED ORDER — MEDROXYPROGESTERONE ACETATE 150 MG/ML IM SUSY
150.0000 mg | PREFILLED_SYRINGE | Freq: Once | INTRAMUSCULAR | Status: AC
  Administered 2016-08-02: 150 mg via INTRAMUSCULAR

## 2016-08-02 MED ORDER — ATORVASTATIN CALCIUM 40 MG PO TABS: 40.0000 mg | ORAL_TABLET | Freq: Every day | ORAL | 3 refills | Status: DC

## 2016-08-02 MED ORDER — LISINOPRIL-HYDROCHLOROTHIAZIDE 20-12.5 MG PO TABS: 1.0000 | ORAL_TABLET | Freq: Every day | ORAL | 3 refills | Status: DC

## 2016-08-02 MED ORDER — MEDROXYPROGESTERONE ACETATE 150 MG/ML IM SUSP: 150.0000 mg | Freq: Once | INTRAMUSCULAR | Status: DC

## 2016-08-02 MED ORDER — METFORMIN HCL 1000 MG PO TABS: ORAL_TABLET | ORAL | 2 refills | Status: DC

## 2016-08-02 NOTE — Progress Notes (Signed)
Subjective: Chief Complaint  Patient presents with  . Diabetes  . cyst on head     HPI: Darlene Bennett is a 40 y.o. presenting to clinic today to discuss the following:  #Diabetes:  Last A1c 6.9 Sugars - High at home: 145    Low at home: 110 Taking medications: yes, Metformin 1000 twice a day, Lantus 15 units, and NovoLog 5 units with dinner Side effects: none, no hypoglycemia On Aspirin On statin ROS: denies fever, chills, dizziness, LOC, polyuria, polydipsia, numbness or tingling in extremities or chest pain, visual disturbances.  #Hypertension: Blood pressure today: Elevated Taking Meds: yes, just lisinopril. Has Norvasc on medication list but states she is not taking Side effects: None Needs medication refills ROS: Denies headache, dizziness, visual changes, nausea, vomiting, chest pain, abdominal pain or shortness of breath.  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 Considering IUD No LMP recorded. Patient has had an injection.    ROS noted in HPI.  Past Medical, Surgical, Social, and Family History Reviewed & Updated per EMR. Smoking status - current smoker   Objective: BP 138/90   Pulse 88   Temp 98.7 F (37.1 C) (Oral)   Ht 6' (1.829 m)   Wt 285 lb (129.3 kg)   BMI 38.65 kg/m  Vitals and nursing notes reviewed  Physical Exam Constitutional: She is well-developed, well-nourished, and in no distress.  Cardiovascular: Normal rate, regular rhythm and normal heart sounds.   Pulmonary/Chest: Effort normal and breath  sounds normal.  Abdominal: Soft. Bowel sounds are normal. There is no tenderness.  Musculoskeletal: She exhibits no edema.   Results for orders placed or performed in visit on 08/02/16 (from the past 72 hour(s))  HgB A1c     Status: None   Collection Time: 08/02/16  2:20 PM  Result Value Ref Range   Hemoglobin A1C 6.3   POCT urine pregnancy     Status: None   Collection Time: 08/02/16  2:20 PM  Result Value Ref Range   Preg Test, Ur Negative Negative    Assessment/Plan: Please see problem based Assessment and Plan   Orders Placed This Encounter  Procedures  . HgB A1c  . POCT urine pregnancy    Meds ordered this encounter  Medications  . lisinopril-hydrochlorothiazide (ZESTORETIC) 20-12.5 MG tablet    Sig: Take 1 tablet by mouth daily.    Dispense:  90 tablet    Refill:  3  . DISCONTD: medroxyPROGESTERone (DEPO-PROVERA) injection 150 mg  . MedroxyPROGESTERone Acetate SUSY 150 mg  . aspirin EC 81 MG tablet    Sig: Take 1 tablet (81 mg total) by mouth daily.    Dispense:  90 tablet    Refill:  3  . atorvastatin (LIPITOR) 40 MG tablet    Sig: Take 1 tablet (40 mg total) by mouth daily.    Dispense:  90 tablet    Refill:  3  . Insulin Glargine (LANTUS SOLOSTAR) 100 UNIT/ML Solostar Pen    Sig: Inject 15 Units into the skin  at bedtime.    Dispense:  15 mL    Refill:  2  . metFORMIN (GLUCOPHAGE) 1000 MG tablet    Sig: TAKE 1 TABLET (1,000 MG TOTAL) BY MOUTH 2 (TWO) TIMES DAILY WITH A MEAL.    Dispense:  180 tablet    Refill:  [place], DO 08/02/2016, 2:36 PM PGY-3, [place]

## 2016-08-02 NOTE — ED Provider Notes (Signed)
[facility] DEPT Provider Note   CSN: [id number] Arrival date & time: 08/02/16  1215   By signing my name below, I, Darlene Bennett, attest that this documentation has been prepared under the direction and in the presence of non-physician practitioner, Abigail Butts, PA-C,. Electronically Signed: Evelene Bennett, Scribe. 08/02/2016. 1:19 PM.  History   Chief Complaint No chief complaint on file.  The history is provided by the patient. No language interpreter was used.   HPI Comments:  Darlene Bennett is a 40 y.o. female who presents to the Emergency Department complaining of "knot" to the back of her head x 20 years with moderate pain to the site. Her pain is exacerbated when she lays in it. She has been searching her symptom online and became concerned because of what she read. Pt has no other complaints or symptoms at this time. No alleviating factors noted.   Past Medical History:  Diagnosis Date  . Cholelithiasis   . Gestational diabetes    insulin  . Gestational diabetes mellitus, antepartum 02/14/2013  . History of gestational diabetes in prior pregnancy, currently pregnant   . Hypertension    preeclampsia with previous pregnancy    Patient Active Problem List   Diagnosis Date Noted  . Preventative health care 02/20/2016  . Contraceptive management 05/25/2015  . Type 2 diabetes mellitus ([place]) 04/24/2015  . Microalbuminuria due to type 2 diabetes mellitus ([place]) 03/25/2015  . Hyperlipemia 03/09/2015  . Tobacco use disorder 03/09/2015  . Essential hypertension 11/15/2012    Past Surgical History:  Procedure Laterality Date  . TUBAL LIGATION     2007    OB History    Gravida Para Term Preterm AB Living   '4 4 3 1 '$ 0 4   SAB TAB Ectopic Multiple Live Births   0 0 0 0 4       Home Medications    Prior to Admission medications   Medication Sig Start Date End Date Taking? Authorizing Provider  ACCU-CHEK FASTCLIX LANCETS MISC 1 each by Does not apply route 4  (four) times daily -  before meals and at bedtime. Check sugar 6 x daily 03/09/15   Katheren Shams, DO  Albiglutide 30 MG PEN Inject '30mg'$  SQ once weekly. 04/02/15   Zenia Resides, MD  amLODipine (NORVASC) 5 MG tablet Take 1 tablet (5 mg total) by mouth daily. Patient not taking: Reported on 01/27/2016 03/25/15   Katheren Shams, DO  aspirin EC 81 MG tablet Take 1 tablet (81 mg total) by mouth daily. 07/08/16   Katheren Shams, DO  atorvastatin (LIPITOR) 40 MG tablet Take 1 tablet (40 mg total) by mouth daily. 05/19/15   Katheren Shams, DO  B-D INS SYR ULTRAFINE 1CC/30G 30G X 1/2" 1 ML MISC USE AS INDICATED WITH INSULIN. 10/02/15   Katheren Shams, DO  B-D ULTRAFINE III SHORT PEN 31G X 8 MM MISC USE AS DIRECTED 06/10/16   Katheren Shams, DO  Blood Glucose Monitoring Suppl (ACCU-CHEK NANO SMARTVIEW) W/DEVICE KIT 1 Device by Does not apply route 4 (four) times daily -  before meals and at bedtime. 03/09/15   Katheren Shams, DO  Blood Pressure KIT Use device to monitor blood pressures daily. 02/17/16   Katheren Shams, DO  glucose blood (ACCU-CHEK AVIVA PLUS) test strip 1 each by Other route 2 (two) times daily. Use twice daily to monitor blood sugars. 04/11/16   Speers N Rumley, DO  insulin aspart (NOVOLOG) 100 UNIT/ML injection  Please inject 5U once daily with your biggest meal. 11/09/15   Pincus Large, DO  Insulin Glargine (LANTUS SOLOSTAR) 100 UNIT/ML Solostar Pen Inject 15 Units into the skin at bedtime. 01/29/16   Pincus Large, DO  lisinopril (PRINIVIL,ZESTRIL) 20 MG tablet Take 1 tablet (20 mg total) by mouth daily. 06/17/16   Pincus Large, DO  medroxyPROGESTERone (DEPO-PROVERA) 150 MG/ML injection Inject 1 mL (150 mg total) into the muscle every 3 (three) months. 05/10/13   Deirdre C Poe, CNM  metFORMIN (GLUCOPHAGE) 1000 MG tablet TAKE 1 TABLET (1,000 MG TOTAL) BY MOUTH 2 (TWO) TIMES DAILY WITH A MEAL. 04/15/16   Pincus Large, DO    Family History Family History  Problem Relation Age of Onset  . Heart  disease Mother   . Hypertension Mother   . Diabetes Mother   . Hypertension Father   . Diabetes Father     Social History Social History  Substance Use Topics  . Smoking status: Current Every Day Smoker    Packs/day: 0.50    Types: Cigarettes  . Smokeless tobacco: Never Used  . Alcohol use No     Allergies   Review of patient's allergies indicates no known allergies.   Review of Systems Review of Systems  Constitutional: Negative for chills and fever.  Respiratory: Negative for shortness of breath.   Cardiovascular: Negative for chest pain.  Skin:       +"Knot" to the back of head  Neurological: Negative for dizziness, speech difficulty, weakness, numbness and headaches.     Physical Exam Updated Vital Signs BP 141/96 (BP Location: Right Arm)   Pulse 89   Temp 98.5 F (36.9 C) (Oral)   Resp 16   SpO2 99%   Physical Exam  Constitutional: She is oriented to person, place, and time. She appears well-developed and well-nourished. No distress.  HENT:  Head: Normocephalic and atraumatic.  Eyes: Conjunctivae are normal. No scleral icterus.  Neck: Normal range of motion.  Cardiovascular: Normal rate, regular rhythm and intact distal pulses.   Pulmonary/Chest: Effort normal and breath sounds normal.  Abdominal: Soft. She exhibits no distension. There is no tenderness.  Lymphadenopathy:    She has no cervical adenopathy.  Neurological: She is alert and oriented to person, place, and time.  Skin: Skin is warm and dry. She is not diaphoretic.  Posterior scalp with discrete mobile and non-tender mass  Psychiatric: She has a normal mood and affect.  Nursing note and vitals reviewed.    ED Treatments / Results  DIAGNOSTIC STUDIES:  Oxygen Saturation is 99% on RA, normal by my interpretation.    COORDINATION OF CARE:  1:15 PM Discussed treatment plan with pt at bedside and pt agreed to plan.  Procedures Procedures (including critical care time)  Medications  Ordered in ED Medications - No data to display   Initial Impression / Assessment and Plan / ED Course  I have reviewed the triage vital signs and the nursing notes.  Pertinent labs & imaging results that were available during my care of the patient were reviewed by me and considered in my medical decision making (see chart for details).  Clinical Course  Value Comment By Time   Scalp mass most consistent with epidermal cyst.  No signs of infection.   Dahlia Client Fortune Torosian, PA-C 08/29 1318  BP: (!) 156/104 HTN - pt with hx of same and no clinical signs of hypertensive urgency Dierdre Forth, PA-C 08/29 1319    Pt with  mass that appears to be consistent with epidermal cyst.  No evidence of infection.  Mass is mobile and discrete.  Doubt malignancy.  Pt is to follow-up with dermatology or plastic surgery for discussion of removal.    Final Clinical Impressions(s) / ED Diagnoses   Final diagnoses:  Epidermal cyst    New Prescriptions Discharge Medication List as of 08/02/2016  1:22 PM      I personally performed the services described in this documentation, which was scribed in my presence. The recorded information has been reviewed and is accurate.     Jarrett Soho Ivone Licht, PA-C 08/02/16 1406    Isla Pence, MD 08/02/16 [phone]

## 2016-08-02 NOTE — Patient Instructions (Signed)
I am pleased to hear that things are going well for you.  Here are some of the things we discussed today: -schedule an appointment for cyst removal when able -come in around 3 months when depo is up for IUD insertion -A1c 6.3 today; discontinue Novolog. Just take your Lantus and metformin -Changing your BP medication to a new combined pill -see medication adjustments  Please schedule a follow-up appointments as above   Thanks for allowing me to be a part of your care! Dr. Gerarda Fraction

## 2016-08-02 NOTE — ED Triage Notes (Signed)
Patient here with knot/abscess to occipital area of scalp x 1-2 years. denies drainage, pain intermittently to the touch

## 2016-08-02 NOTE — ED Notes (Signed)
Pt here for knot on occipital scalp since childhood. States it is NOT very sore.

## 2016-08-02 NOTE — Discharge Instructions (Signed)
1. Medications: usual home medications 2. Treatment: rest, drink plenty of fluids,  3. Follow Up: Please followup with your primary doctor in 2-3 days for discussion of your diagnoses and further evaluation after today's visit; if you do not have a primary care doctor use the resource guide provided to find one; Please return to the ER for signs of infection

## 2016-08-04 DIAGNOSIS — L720 Epidermal cyst: Secondary | ICD-10-CM

## 2016-08-04 HISTORY — DX: Epidermal cyst: L72.0

## 2016-08-04 NOTE — Assessment & Plan Note (Signed)
Continues to be above goal BP on multiple office visits. Patient is noncompliant with regimen. Rx for lisinpril-HCTZ given. Again discussed importance of good BP control. Patient to follow-up in 2 weeks to reevaluate blood pressure on new medication.

## 2016-08-04 NOTE — Assessment & Plan Note (Addendum)
Upreg negative. Depo injection given today. Patient to schedule appointment prior to Depo expiration for IUD insertion.

## 2016-08-04 NOTE — Assessment & Plan Note (Signed)
A1c today 6.3 down from 6.9. Patient continues to be at goal. Well controlled. Patient to continue Lantus 15 units daily. Stop NovoLog 5 units with meal. Continue metformin 1000 twice a day. We'll reassess A1c in 3 months. Patient encouraged to continue checking blood sugars and maintaining healthy lifestyle.

## 2016-08-04 NOTE — Assessment & Plan Note (Signed)
Epidermal cyst located on occipital region of head. Discussed options of removal with patient including referral to neurosurgery versus removing office. Patient will like removal in office. Patient to schedule appointment for cyst removal. Risks of procedure discussed with patient.

## 2016-08-19 LAB — GLUCOSE, POCT (MANUAL RESULT ENTRY): POC GLUCOSE: 114 mg/dL — AB (ref 70–99)

## 2016-08-22 ENCOUNTER — Encounter: Payer: Self-pay | Admitting: Obstetrics and Gynecology

## 2016-08-22 ENCOUNTER — Ambulatory Visit (INDEPENDENT_AMBULATORY_CARE_PROVIDER_SITE_OTHER): Payer: Medicaid Other | Admitting: Obstetrics and Gynecology

## 2016-08-22 VITALS — BP 152/99 | HR 93 | Temp 98.7°F | Ht 72.0 in | Wt 276.5 lb

## 2016-08-22 DIAGNOSIS — L720 Epidermal cyst: Secondary | ICD-10-CM

## 2016-08-22 NOTE — Progress Notes (Signed)
Patient arrived for cyst removal. However, after discussing with preceptor Dr. Ardelia Mems it was felt best that patient have epidermal cyst removed by surgeon due to location. Referral placed. No charge visit. Please see my prior note for details on cyst.

## 2016-09-01 ENCOUNTER — Ambulatory Visit: Payer: Self-pay | Admitting: Surgery

## 2016-09-01 NOTE — H&P (Signed)
Darlene Bennett 09/01/2016 10:25 AM Location: [facility] Patient #: [id number] DOB: December 30, 1975 Single / Language: Darlene Bennett / Race: Black or African American Female  History of Present Illness (Darlene Lotspeich A. Kae Heller MD; 09/01/2016 10:40 AM) Patient words: This is a 40 year old woman with a history of hypertension, diabetes who presents with a long standing history of a posterior scalp cyst which she feels is slightly increased in size recently as it has begin to cause her some discomfort. She denies any drainage from the site or any history of procedures or treatment with antibiotics. She desires removal.  The patient is a 40 year old female.   Other Problems Darlene Bennett, CMA; 09/01/2016 10:25 AM) Diabetes Mellitus High blood pressure Hypercholesterolemia  Diagnostic Studies History Darlene Bennett, CMA; 09/01/2016 10:25 AM) Colonoscopy never Mammogram never Pap Smear 1-5 years ago  Allergies Darlene Bennett, CMA; 09/01/2016 10:26 AM) No Known Drug Allergies 09/01/2016  Medication History Darlene Bennett, CMA; 09/01/2016 10:26 AM) Accu-Chek Aviva Plus (In Vitro) Active. Aspirin (81MG  Tablet DR, Oral) Active. Atorvastatin Calcium (40MG  Tablet, Oral) Active. BD Insulin Syringe Ultrafine (30G X 1/2"1 ML Misc,) Active. BD Pen Needle Short U/F (31G X 8 MM Misc,) Active. Lantus SoloStar (100UNIT/ML Soln Pen-inj, Subcutaneous) Active. Lisinopril-Hydrochlorothiazide (20-12.5MG  Tablet, Oral) Active. MetFORMIN HCl (1000MG  Tablet, Oral) Active. NovoLOG (100UNIT/ML Solution, Subcutaneous) Active. Medications Reconciled  Social History Darlene Bennett, [place]; 09/01/2016 10:26 AM) Caffeine use Coffee. No alcohol use No drug use Tobacco use Current some day smoker.  Family History Darlene Bennett, [place]; 09/01/2016 10:25 AM) Cerebrovascular Accident Darlene Bennett. Diabetes Mellitus Darlene Bennett, Darlene Bennett. Hypertension Darlene Bennett, Darlene Bennett.  Pregnancy / Birth History Darlene Bennett, CMA; 09/01/2016 10:26  AM) Darlene Bennett 4 Length (months) of breastfeeding 3-6 Maternal Bennett <15 Para 4     Review of Systems Darlene Bennett CMA; 09/01/2016 10:26 AM) General Not Present- Appetite Loss, Chills, Fatigue, Fever, Night Sweats, Weight Gain and Weight Loss. Skin Not Present- Change in Wart/Mole, Dryness, Hives, Jaundice, New Lesions, Non-Healing Wounds, Rash and Ulcer. HEENT Not Present- Earache, Hearing Loss, Hoarseness, Nose Bleed, Oral Ulcers, Ringing in the Ears, Seasonal Allergies, Sinus Pain, Sore Throat, Visual Disturbances, Wears glasses/contact lenses and Yellow Eyes. Respiratory Not Present- Bloody sputum, Chronic Cough, Difficulty Breathing, Snoring and Wheezing. Breast Not Present- Breast Mass, Breast Pain, Nipple Discharge and Skin Changes. Cardiovascular Not Present- Chest Pain, Difficulty Breathing Lying Down, Leg Cramps, Palpitations, Rapid Heart Rate, Shortness of Breath and Swelling of Extremities. Gastrointestinal Not Present- Abdominal Pain, Bloating, Bloody Stool, Change in Bowel Habits, Chronic diarrhea, Constipation, Difficulty Swallowing, Excessive gas, Gets full quickly at meals, Hemorrhoids, Indigestion, Nausea, Rectal Pain and Vomiting. Female Genitourinary Not Present- Frequency, Nocturia, Painful Urination, Pelvic Pain and Urgency. Musculoskeletal Not Present- Back Pain, Joint Pain, Joint Stiffness, Muscle Pain, Muscle Weakness and Swelling of Extremities. Neurological Not Present- Decreased Memory, Fainting, Headaches, Numbness, Seizures, Tingling, Tremor, Trouble walking and Weakness. Psychiatric Not Present- Anxiety, Bipolar, Change in Sleep Pattern, Depression, Fearful and Frequent crying. Endocrine Not Present- Cold Intolerance, Excessive Hunger, Hair Changes, Heat Intolerance, Hot flashes and New Diabetes. Hematology Not Present- Blood Thinners, Easy Bruising, Excessive bleeding, Gland problems, HIV and Persistent Infections.  Vitals Darlene Bennett CMA; 09/01/2016 10:27  AM) 09/01/2016 10:26 AM Weight: 274 lb Height: 72in Body Surface Area: 2.44 m Body Mass Index: 37.16 kg/m  Temp.: 98.61F  Pulse: 88 (Regular)  BP: 140/82 (Sitting, Left Arm, Standard)      Physical Exam (Darlene Bennett A. Kae Heller MD; 09/01/2016 10:41 AM)  The physical exam findings are as follows: Note:Alert and  oriented in no acute distress EOMI, PERRL, MMM Neck without mass/thyromegaly Unlabored resp, CTAB, RRR Abd s/nt/nd Ext wwp Neuro grossly intact Psych normal mood and affect Skin: 5cm smooth, mobile subcutaneous mass in posterior midline scalp just within the hairline. no erythema, induration or warmth, no punctum or drainage, no overlying skin changes    Assessment & Plan (Darlene Dotzler A. 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 We'll plan for excision in the operating room. I explained that we'll need to shave the area, and that the risks include bleeding, infection, pain, scarring, and recurrence of the cyst. She understands and is agreeable to proceed.

## 2016-09-09 NOTE — Patient Instructions (Addendum)
Shiara Cloyd  09/09/2016   Your procedure is scheduled on: 09-15-16  Report to [facility] Main  Entrance take [facility]  elevators to 3rd floor to  [place] at 720 AM.  Call this number if you have problems the morning of surgery [phone]   Remember: ONLY 1 PERSON MAY GO WITH YOU TO SHORT STAY TO GET  READY MORNING OF [address].  Do not eat food or drink liquids :After Midnight.     Take these medicines the morning of surgery with A SIP OF WATER:TAKE 1/2 DOSE BEDTIME LANTUS INSULIN OM 09-14-16  DO NOT TAKE ANY DIABETIC MEDICATIONS DAY OF YOUR SURGERY                               You may not have any metal on your body including hair pins and              piercings  Do not wear jewelry, make-up, lotions, powders or perfumes, deodorant             Do not wear nail polish.  Do not shave  48 hours prior to surgery.              Men may shave face and neck.   Do not bring valuables to the hospital. [place].  Contacts, dentures or bridgework may not be worn into surgery.  Leave suitcase in the car. After surgery it may be brought to your room.     Patients discharged the day of surgery will not be allowed to drive home.  Name and phone number of your driver: patient will arrange driver 18 or over by day of surgery  Special Instructions: N/A              Please read over the following fact sheets you were given: _____________________________________________________________________             [facility] - Preparing for Surgery Before surgery, you can play an important role.  Because skin is not sterile, your skin needs to be as free of germs as possible.  You can reduce the number of germs on your skin by washing with CHG (chlorahexidine gluconate) soap before surgery.  CHG is an antiseptic cleaner which kills germs and bonds with the skin to continue killing germs even after washing. Please DO NOT  use if you have an allergy to CHG or antibacterial soaps.  If your skin becomes reddened/irritated stop using the CHG and inform your nurse when you arrive at Short Stay. Do not shave (including legs and underarms) for at least 48 hours prior to the first CHG shower.  You may shave your face/neck. Please follow these instructions carefully:  1.  Shower with CHG Soap the night before surgery and the  morning of Surgery.  2.  If you choose to wash your hair, wash your hair first as usual with your  normal  shampoo.  3.  After you shampoo, rinse your hair and body thoroughly to remove the  shampoo.                           4.  Use CHG as you would any  other liquid soap.  You can apply chg directly  to the skin and wash                       Gently with a scrungie or clean washcloth.  5.  Apply the CHG Soap to your body ONLY FROM THE NECK DOWN.   Do not use on face/ open                           Wound or open sores. Avoid contact with eyes, ears mouth and genitals (private parts).                       Wash face,  Genitals (private parts) with your normal soap.             6.  Wash thoroughly, paying special attention to the area where your surgery  will be performed.  7.  Thoroughly rinse your body with warm water from the neck down.  8.  DO NOT shower/wash with your normal soap after using and rinsing off  the CHG Soap.                9.  Pat yourself dry with a clean towel.            10.  Wear clean pajamas.            11.  Place clean sheets on your bed the night of your first shower and do not  sleep with pets. Day of Surgery : Do not apply any lotions/deodorants the morning of surgery.  Please wear clean clothes to the hospital/surgery center.  FAILURE TO FOLLOW THESE INSTRUCTIONS MAY RESULT IN THE CANCELLATION OF YOUR SURGERY PATIENT SIGNATURE_________________________________  NURSE  SIGNATURE__________________________________  ________________________________________________________________________

## 2016-09-09 NOTE — Progress Notes (Signed)
CHEST XRAY 11-17-15 EPIC

## 2016-09-12 ENCOUNTER — Encounter (HOSPITAL_COMMUNITY): Payer: Self-pay

## 2016-09-12 ENCOUNTER — Encounter (INDEPENDENT_AMBULATORY_CARE_PROVIDER_SITE_OTHER): Payer: Self-pay

## 2016-09-12 ENCOUNTER — Encounter (HOSPITAL_COMMUNITY)
Admission: RE | Admit: 2016-09-12 | Discharge: 2016-09-12 | Disposition: A | Discharge status: Home / Self Care | Payer: Medicaid Other | Source: Ambulatory Visit | Attending: Surgery | Admitting: Surgery

## 2016-09-12 DIAGNOSIS — Z01818 Encounter for other preprocedural examination: Secondary | ICD-10-CM | POA: Diagnosis not present

## 2016-09-12 HISTORY — DX: Follicular cyst of the skin and subcutaneous tissue, unspecified: L72.9

## 2016-09-12 HISTORY — DX: Personal history of gestational diabetes: Z86.32

## 2016-09-12 LAB — CBC
HCT: 40 % (ref 36.0–46.0)
Hemoglobin: 13.2 g/dL (ref 12.0–15.0)
MCH: 29.2 pg (ref 26.0–34.0)
MCHC: 33 g/dL (ref 30.0–36.0)
MCV: 88.5 fL (ref 78.0–100.0)
PLATELETS: 335 10*3/uL (ref 150–400)
RBC: 4.52 MIL/uL (ref 3.87–5.11)
RDW: 13.3 % (ref 11.5–15.5)
WBC: 8.1 10*3/uL (ref 4.0–10.5)

## 2016-09-12 LAB — BASIC METABOLIC PANEL
Anion gap: 8 (ref 5–15)
BUN: 12 mg/dL (ref 6–20)
CALCIUM: 9.2 mg/dL (ref 8.9–10.3)
CHLORIDE: 106 mmol/L (ref 101–111)
CO2: 23 mmol/L (ref 22–32)
CREATININE: 0.98 mg/dL (ref 0.44–1.00)
GFR calc non Af Amer: >60 mL/min (ref >60)
Glucose, Bld: 155 mg/dL — ABNORMAL HIGH (ref 65–99)
Potassium: 3.9 mmol/L (ref 3.5–5.1)
Sodium: 137 mmol/L (ref 135–145)

## 2016-09-12 LAB — HCG, SERUM, QUALITATIVE: PREG SERUM: NEGATIVE

## 2016-09-13 LAB — HEMOGLOBIN A1C
HEMOGLOBIN A1C: 6.7 % — AB (ref 4.8–5.6)
Mean Plasma Glucose: 146 mg/dL

## 2016-09-14 MED ORDER — CEFAZOLIN SODIUM 10 G IJ SOLR
3.0000 g | INTRAMUSCULAR | Status: AC
  Administered 2016-09-15: 3 g via INTRAVENOUS
  Filled 2016-09-14: qty 3

## 2016-09-15 ENCOUNTER — Encounter (HOSPITAL_COMMUNITY)
Admission: RE | Disposition: A | Discharge status: Home / Self Care | Payer: Self-pay | Source: Ambulatory Visit | Attending: Surgery

## 2016-09-15 ENCOUNTER — Ambulatory Visit (HOSPITAL_COMMUNITY): Payer: Medicaid Other | Admitting: Anesthesiology

## 2016-09-15 ENCOUNTER — Encounter (HOSPITAL_COMMUNITY): Payer: Self-pay | Admitting: Anesthesiology

## 2016-09-15 ENCOUNTER — Ambulatory Visit (HOSPITAL_COMMUNITY)
Admission: RE | Admit: 2016-09-15 | Discharge: 2016-09-15 | Disposition: A | Discharge status: Home / Self Care | Payer: Medicaid Other | Source: Ambulatory Visit | Attending: Surgery | Admitting: Surgery

## 2016-09-15 DIAGNOSIS — Z794 Long term (current) use of insulin: Secondary | ICD-10-CM | POA: Insufficient documentation

## 2016-09-15 DIAGNOSIS — Z79899 Other long term (current) drug therapy: Secondary | ICD-10-CM | POA: Insufficient documentation

## 2016-09-15 DIAGNOSIS — L720 Epidermal cyst: Secondary | ICD-10-CM | POA: Diagnosis not present

## 2016-09-15 DIAGNOSIS — E119 Type 2 diabetes mellitus without complications: Secondary | ICD-10-CM | POA: Insufficient documentation

## 2016-09-15 HISTORY — PX: CYST EXCISION: SHX5701

## 2016-09-15 LAB — GLUCOSE, CAPILLARY
GLUCOSE-CAPILLARY: 133 mg/dL — AB (ref 65–99)
GLUCOSE-CAPILLARY: 127 mg/dL — AB (ref 65–99)

## 2016-09-15 SURGERY — CYST REMOVAL
Anesthesia: General | Site: Head

## 2016-09-15 MED ORDER — LACTATED RINGERS IV SOLN
INTRAVENOUS | Status: DC
  Administered 2016-09-15: 09:00:00 via INTRAVENOUS

## 2016-09-15 MED ORDER — OXYCODONE-ACETAMINOPHEN 5-325 MG PO TABS
1.0000 | ORAL_TABLET | Freq: Four times a day (QID) | ORAL | 0 refills | Status: DC | PRN
Start: 2016-09-15 — End: 2017-09-04

## 2016-09-15 MED ORDER — LIDOCAINE 2% (20 MG/ML) 5 ML SYRINGE
INTRAMUSCULAR | Status: AC
  Filled 2016-09-15: qty 5

## 2016-09-15 MED ORDER — PROPOFOL 10 MG/ML IV BOLUS
INTRAVENOUS | Status: DC | PRN
  Administered 2016-09-15: 180 mg via INTRAVENOUS

## 2016-09-15 MED ORDER — FENTANYL CITRATE (PF) 100 MCG/2ML IJ SOLN
INTRAMUSCULAR | Status: AC
  Filled 2016-09-15: qty 2

## 2016-09-15 MED ORDER — DEXAMETHASONE SODIUM PHOSPHATE 10 MG/ML IJ SOLN
INTRAMUSCULAR | Status: DC | PRN
  Administered 2016-09-15: 10 mg via INTRAVENOUS

## 2016-09-15 MED ORDER — ROCURONIUM BROMIDE 10 MG/ML (PF) SYRINGE
PREFILLED_SYRINGE | INTRAVENOUS | Status: DC | PRN
  Administered 2016-09-15: 50 mg via INTRAVENOUS

## 2016-09-15 MED ORDER — SUGAMMADEX SODIUM 500 MG/5ML IV SOLN
INTRAVENOUS | Status: AC
  Filled 2016-09-15: qty 5

## 2016-09-15 MED ORDER — ONDANSETRON HCL 4 MG/2ML IJ SOLN
INTRAMUSCULAR | Status: DC | PRN
  Administered 2016-09-15: 4 mg via INTRAVENOUS

## 2016-09-15 MED ORDER — LABETALOL HCL 5 MG/ML IV SOLN
INTRAVENOUS | Status: DC | PRN
  Administered 2016-09-15 (×2): 5 mg via INTRAVENOUS

## 2016-09-15 MED ORDER — BUPIVACAINE HCL (PF) 0.5 % IJ SOLN
INTRAMUSCULAR | Status: DC | PRN
  Administered 2016-09-15: 10 mL

## 2016-09-15 MED ORDER — BUPIVACAINE HCL (PF) 0.5 % IJ SOLN
INTRAMUSCULAR | Status: AC
  Filled 2016-09-15: qty 30

## 2016-09-15 MED ORDER — CHLORHEXIDINE GLUCONATE CLOTH 2 % EX PADS: 6.0000 | MEDICATED_PAD | Freq: Once | CUTANEOUS | Status: DC

## 2016-09-15 MED ORDER — PROPOFOL 10 MG/ML IV BOLUS
INTRAVENOUS | Status: AC
  Filled 2016-09-15: qty 20

## 2016-09-15 MED ORDER — FENTANYL CITRATE (PF) 100 MCG/2ML IJ SOLN
INTRAMUSCULAR | Status: DC | PRN
  Administered 2016-09-15: 100 ug via INTRAVENOUS

## 2016-09-15 MED ORDER — ROCURONIUM BROMIDE 10 MG/ML (PF) SYRINGE
PREFILLED_SYRINGE | INTRAVENOUS | Status: AC
  Filled 2016-09-15: qty 10

## 2016-09-15 MED ORDER — OXYCODONE-ACETAMINOPHEN 5-325 MG PO TABS: 1.0000 | ORAL_TABLET | ORAL | Status: DC | PRN

## 2016-09-15 MED ORDER — HYDROMORPHONE HCL 1 MG/ML IJ SOLN: 0.2500 mg | INTRAMUSCULAR | Status: DC | PRN

## 2016-09-15 MED ORDER — MIDAZOLAM HCL 2 MG/2ML IJ SOLN
INTRAMUSCULAR | Status: AC
  Filled 2016-09-15: qty 2

## 2016-09-15 MED ORDER — SODIUM CHLORIDE 0.9 % IR SOLN
Status: DC | PRN
  Administered 2016-09-15: 1000 mL

## 2016-09-15 MED ORDER — LIDOCAINE 2% (20 MG/ML) 5 ML SYRINGE
INTRAMUSCULAR | Status: DC | PRN
  Administered 2016-09-15: 100 mg via INTRAVENOUS

## 2016-09-15 MED ORDER — MEPERIDINE HCL 50 MG/ML IJ SOLN: 6.2500 mg | INTRAMUSCULAR | Status: DC | PRN

## 2016-09-15 MED ORDER — DEXAMETHASONE SODIUM PHOSPHATE 10 MG/ML IJ SOLN
INTRAMUSCULAR | Status: AC
  Filled 2016-09-15: qty 1

## 2016-09-15 MED ORDER — MIDAZOLAM HCL 5 MG/5ML IJ SOLN
INTRAMUSCULAR | Status: DC | PRN
  Administered 2016-09-15: 2 mg via INTRAVENOUS

## 2016-09-15 MED ORDER — LACTATED RINGERS IV SOLN
INTRAVENOUS | Status: DC
  Administered 2016-09-15: 11:00:00 via INTRAVENOUS

## 2016-09-15 MED ORDER — PROMETHAZINE HCL 25 MG/ML IJ SOLN: 6.2500 mg | INTRAMUSCULAR | Status: DC | PRN

## 2016-09-15 MED ORDER — LABETALOL HCL 5 MG/ML IV SOLN
INTRAVENOUS | Status: AC
  Filled 2016-09-15: qty 4

## 2016-09-15 MED ORDER — SUGAMMADEX SODIUM 500 MG/5ML IV SOLN
INTRAVENOUS | Status: DC | PRN
  Administered 2016-09-15: 175 mg via INTRAVENOUS

## 2016-09-15 MED ORDER — ONDANSETRON HCL 4 MG/2ML IJ SOLN
INTRAMUSCULAR | Status: AC
  Filled 2016-09-15: qty 2

## 2016-09-15 MED ORDER — DOCUSATE SODIUM 100 MG PO CAPS: 100.0000 mg | ORAL_CAPSULE | Freq: Two times a day (BID) | ORAL | 0 refills | Status: AC

## 2016-09-15 SURGICAL SUPPLY — 25 items
BLADE SURG SZ11 CARB STEEL (BLADE) ×2 IMPLANT
CHLORAPREP W/TINT 26ML (MISCELLANEOUS) ×2 IMPLANT
COVER SURGICAL LIGHT HANDLE (MISCELLANEOUS) ×2 IMPLANT
DECANTER SPIKE VIAL GLASS SM (MISCELLANEOUS) IMPLANT
DERMABOND ADVANCED (GAUZE/BANDAGES/DRESSINGS) ×1
DERMABOND ADVANCED .7 DNX12 (GAUZE/BANDAGES/DRESSINGS) ×1 IMPLANT
DRAPE LAPAROSCOPIC ABDOMINAL (DRAPES) IMPLANT
DRAPE LAPAROTOMY TRNSV 102X78 (DRAPE) IMPLANT
DRSG PAD ABDOMINAL 8X10 ST (GAUZE/BANDAGES/DRESSINGS) IMPLANT
ELECT REM PT RETURN 9FT ADLT (ELECTROSURGICAL) ×2
ELECTRODE REM PT RTRN 9FT ADLT (ELECTROSURGICAL) ×1 IMPLANT
GAUZE SPONGE 4X4 12PLY STRL (GAUZE/BANDAGES/DRESSINGS) IMPLANT
GLOVE BIOGEL PI IND STRL 7.0 (GLOVE) ×1 IMPLANT
GLOVE BIOGEL PI INDICATOR 7.0 (GLOVE) ×1
GLOVE SURG SS PI 7.0 STRL IVOR (GLOVE) ×2 IMPLANT
GOWN STRL REUS W/TWL LRG LVL3 (GOWN DISPOSABLE) ×2 IMPLANT
GOWN STRL REUS W/TWL XL LVL3 (GOWN DISPOSABLE) ×2 IMPLANT
KIT BASIN OR (CUSTOM PROCEDURE TRAY) ×2 IMPLANT
LUBRICANT JELLY K Y 4OZ (MISCELLANEOUS) IMPLANT
PACK GENERAL/GYN (CUSTOM PROCEDURE TRAY) ×2 IMPLANT
SUT MNCRL AB 4-0 PS2 18 (SUTURE) IMPLANT
SWAB COLLECTION DEVICE MRSA (MISCELLANEOUS) IMPLANT
SWAB CULTURE ESWAB REG 1ML (MISCELLANEOUS) IMPLANT
TOWEL OR 17X26 10 PK STRL BLUE (TOWEL DISPOSABLE) ×2 IMPLANT
TOWEL OR NON WOVEN STRL DISP B (DISPOSABLE) ×2 IMPLANT

## 2016-09-15 NOTE — Anesthesia Procedure Notes (Signed)
Procedure Name: Intubation Date/Time: 09/15/2016 9:16 AM Performed by: Noralyn Pick D Pre-anesthesia Checklist: Patient identified, Emergency Drugs available, Suction available and Patient being monitored Patient Re-evaluated:Patient Re-evaluated prior to inductionOxygen Delivery Method: Circle system utilized Preoxygenation: Pre-oxygenation with 100% oxygen Intubation Type: IV induction Ventilation: Mask ventilation without difficulty Laryngoscope Size: Mac and 3 Grade View: Grade II Tube type: Oral Tube size: 7.5 mm Number of attempts: 1 Airway Equipment and Method: Stylet and Oral airway Placement Confirmation: ETT inserted through vocal cords under direct vision,  positive ETCO2 and breath sounds checked- equal and bilateral Secured at: 21 cm Tube secured with: Tape Dental Injury: Teeth and Oropharynx as per pre-operative assessment

## 2016-09-15 NOTE — H&P (View-Only) (Signed)
Geanette Caress 09/01/2016 10:25 AM Location: [facility] Patient #: [id number] DOB: 06/29/1976 Single / Language: [name] / Race: Black or African American Female  History of Present Illness (Sonnie Bias A. Kae Heller MD; 09/01/2016 10:40 AM) Patient words: This is a 40 year old woman with a history of hypertension, diabetes who presents with a long standing history of a posterior scalp cyst which she feels is slightly increased in size recently as it has begin to cause her some discomfort. She denies any drainage from the site or any history of procedures or treatment with antibiotics. She desires removal.  The patient is a 40 year old female.   Other Problems Elbert Ewings, CMA; 09/01/2016 10:25 AM) Diabetes Mellitus High blood pressure Hypercholesterolemia  Diagnostic Studies History Elbert Ewings, CMA; 09/01/2016 10:25 AM) Colonoscopy never Mammogram never Pap Smear 1-5 years ago  Allergies Elbert Ewings, CMA; 09/01/2016 10:26 AM) No Known Drug Allergies 09/01/2016  Medication History Elbert Ewings, CMA; 09/01/2016 10:26 AM) Accu-Chek Aviva Plus (In Vitro) Active. Aspirin (81MG  Tablet DR, Oral) Active. Atorvastatin Calcium (40MG  Tablet, Oral) Active. BD Insulin Syringe Ultrafine (30G X 1/2"1 ML Misc,) Active. BD Pen Needle Short U/F (31G X 8 MM Misc,) Active. Lantus SoloStar (100UNIT/ML Soln Pen-inj, Subcutaneous) Active. Lisinopril-Hydrochlorothiazide (20-12.5MG  Tablet, Oral) Active. MetFORMIN HCl (1000MG  Tablet, Oral) Active. NovoLOG (100UNIT/ML Solution, Subcutaneous) Active. Medications Reconciled  Social History Elbert Ewings, [place]; 09/01/2016 10:26 AM) Caffeine use Coffee. No alcohol use No drug use Tobacco use Current some day smoker.  Family History Elbert Ewings, [place]; 09/01/2016 10:25 AM) Cerebrovascular Accident Father. Diabetes Mellitus Father, Mother. Hypertension Father, Mother.  Pregnancy / Birth History Elbert Ewings, CMA; 09/01/2016 10:26  AM) [name] 4 Length (months) of breastfeeding 3-6 Maternal age <15 Para 4     Review of Systems Elbert Ewings CMA; 09/01/2016 10:26 AM) General Not Present- Appetite Loss, Chills, Fatigue, Fever, Night Sweats, Weight Gain and Weight Loss. Skin Not Present- Change in Wart/Mole, Dryness, Hives, Jaundice, New Lesions, Non-Healing Wounds, Rash and Ulcer. HEENT Not Present- Earache, Hearing Loss, Hoarseness, Nose Bleed, Oral Ulcers, Ringing in the Ears, Seasonal Allergies, Sinus Pain, Sore Throat, Visual Disturbances, Wears glasses/contact lenses and Yellow Eyes. Respiratory Not Present- Bloody sputum, Chronic Cough, Difficulty Breathing, Snoring and Wheezing. Breast Not Present- Breast Mass, Breast Pain, Nipple Discharge and Skin Changes. Cardiovascular Not Present- Chest Pain, Difficulty Breathing Lying Down, Leg Cramps, Palpitations, Rapid Heart Rate, Shortness of Breath and Swelling of Extremities. Gastrointestinal Not Present- Abdominal Pain, Bloating, Bloody Stool, Change in Bowel Habits, Chronic diarrhea, Constipation, Difficulty Swallowing, Excessive gas, Gets full quickly at meals, Hemorrhoids, Indigestion, Nausea, Rectal Pain and Vomiting. Female Genitourinary Not Present- Frequency, Nocturia, Painful Urination, Pelvic Pain and Urgency. Musculoskeletal Not Present- Back Pain, Joint Pain, Joint Stiffness, Muscle Pain, Muscle Weakness and Swelling of Extremities. Neurological Not Present- Decreased Memory, Fainting, Headaches, Numbness, Seizures, Tingling, Tremor, Trouble walking and Weakness. Psychiatric Not Present- Anxiety, Bipolar, Change in Sleep Pattern, Depression, Fearful and Frequent crying. Endocrine Not Present- Cold Intolerance, Excessive Hunger, Hair Changes, Heat Intolerance, Hot flashes and New Diabetes. Hematology Not Present- Blood Thinners, Easy Bruising, Excessive bleeding, Gland problems, HIV and Persistent Infections.  Vitals Elbert Ewings CMA; 09/01/2016 10:27  AM) 09/01/2016 10:26 AM Weight: 274 lb Height: 72in Body Surface Area: 2.44 m Body Mass Index: 37.16 kg/m  Temp.: 98.65F  Pulse: 88 (Regular)  BP: 140/82 (Sitting, Left Arm, Standard)      Physical Exam (Deryk Bozman A. Kae Heller MD; 09/01/2016 10:41 AM)  The physical exam findings are as follows: Note:Alert and  oriented in no acute distress EOMI, PERRL, MMM Neck without mass/thyromegaly Unlabored resp, CTAB, RRR Abd s/nt/nd Ext wwp Neuro grossly intact Psych normal mood and affect Skin: 5cm smooth, mobile subcutaneous mass in posterior midline scalp just within the hairline. no erythema, induration or warmth, no punctum or drainage, no overlying skin changes    Assessment & Plan (Armanie Martine A. 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 We'll plan for excision in the operating room. I explained that we'll need to shave the area, and that the risks include bleeding, infection, pain, scarring, and recurrence of the cyst. She understands and is agreeable to proceed.

## 2016-09-15 NOTE — Anesthesia Preprocedure Evaluation (Addendum)
Anesthesia Evaluation  Patient identified by MRN, date of birth, ID band Patient awake    Reviewed: Allergy & Precautions, NPO status , Patient's Chart, lab work & pertinent test results  Airway Mallampati: I  TM Distance: >3 FB Neck ROM: Full    Dental  (+) Teeth Intact, Dental Advisory Given   Pulmonary Current Smoker,    breath sounds clear to auscultation       Cardiovascular hypertension,  Rhythm:Regular Rate:Normal     Neuro/Psych PSYCHIATRIC DISORDERS negative neurological ROS     GI/Hepatic negative GI ROS, Neg liver ROS,   Endo/Other  diabetes, Type 2, Insulin Dependent, Oral Hypoglycemic Agents  Renal/GU   negative genitourinary   Musculoskeletal negative musculoskeletal ROS (+)   Abdominal   Peds negative pediatric ROS (+)  Hematology negative hematology ROS (+)   Anesthesia Other Findings   Reproductive/Obstetrics negative OB ROS                            Lab Results  Component Value Date   WBC 8.1 09/12/2016   HGB 13.2 09/12/2016   HCT 40.0 09/12/2016   MCV 88.5 09/12/2016   PLT 335 09/12/2016   Lab Results  Component Value Date   CREATININE 0.98 09/12/2016   BUN 12 09/12/2016   NA 137 09/12/2016   K 3.9 09/12/2016   CL 106 09/12/2016   CO2 23 09/12/2016   No results found for: INR, PROTIME  09/2016 EKG: normal sinus rhythm.   Anesthesia Physical Anesthesia Plan  ASA: II  Anesthesia Plan: General   Post-op Pain Management:    Induction: Intravenous  Airway Management Planned: Oral ETT  Additional Equipment:   Intra-op Plan:   Post-operative Plan: Extubation in OR  Informed Consent: I have reviewed the patients History and Physical, chart, labs and discussed the procedure including the risks, benefits and alternatives for the proposed anesthesia with the patient or authorized representative who has indicated his/her understanding and acceptance.    Dental advisory given  Plan Discussed with: CRNA  Anesthesia Plan Comments:         Anesthesia Quick Evaluation

## 2016-09-15 NOTE — Transfer of Care (Signed)
Immediate Anesthesia Transfer of Care Note  Patient: Darlene Bennett  Procedure(s) Performed: Procedure(s): EXCISION POSTERIOR SCALP CYST (N/A)  Patient Location: PACU  Anesthesia Type:General  Level of Consciousness: awake, alert  and oriented  Airway & Oxygen Therapy: Patient Spontanous Breathing and Patient connected to face mask oxygen  Post-op Assessment: Report given to RN and Post -op Vital signs reviewed and stable  Post vital signs: Reviewed and stable  Last Vitals:  Vitals:   09/15/16 0723  BP: (!) 160/94  Pulse: 80  Resp: 18  Temp: 37.1 C    Last Pain:  Vitals:   09/15/16 0723  TempSrc: Oral      Patients Stated Pain Goal: 3 ([id number])  Complications: No apparent anesthesia complications

## 2016-09-15 NOTE — Interval H&P Note (Signed)
History and Physical Interval Note:  09/15/2016 10:14 AM  Darlene Bennett  has presented today for surgery, with the diagnosis of Posterior scalp lesion  The various methods of treatment have been discussed with the patient and family. After consideration of risks, benefits and other options for treatment, the patient has consented to  Procedure(s): EXCISION POSTERIOR SCALP CYST (N/A) as a surgical intervention .  The patient's history has been reviewed, patient examined, no change in status, stable for surgery.  I have reviewed the patient's chart and labs.  Questions were answered to the patient's satisfaction.     [name] Rich Brave

## 2016-09-15 NOTE — Op Note (Signed)
Operative Note  Darlene Bennett 40 y.o. female ZN:[id number]  09/15/2016  Surgeon: Clovis Riley   Assistant: none  Procedure performed: Excision of 5cm posterior scalp cyst  Preop diagnosis: large epidermal inclusion cyst  Post-op diagnosis/intraop findings: same  Specimens: posterior scalp cyst  EBL: 5cc  Complications: none  Description of procedure: After obtaining informed consent the patient was brought to the operating room. Prophylactic antibiotics and subcutaneous heparin were administered. SCD's were applied. General endotracheal anesthesia was initiated and a formal time-out was performed and the patient was positioned prone with all pressure points appropriately padded. The back of the head was clipped, prepped and draped in the usual sterile fashion. An elliptical incision was made in the skin overlying the mass and dissection carried down through the subcutaneous tissues with cautery until a plane surrounding the mass was encountered. This was carefully dissected out with cautery until the lesion could be removed intact with the attached overlying skin ellipse. Hemostasis was ensured in the wound with cautery. The wound was irrigated and then closed in three layers with 3-0 vicryls in the subcutaneous and deep dermal tissues, followed by a running subcuticular monocryl and dermabond. The patient was then returned to the supine position, awakened and extubated, and taken to PACU in stable condition.   All counts were correct at the completion of the case.

## 2016-09-15 NOTE — Discharge Instructions (Signed)
Always review your discharge instruction sheet given to you by the facility where your surgery was performed. IF YOU HAVE DISABILITY OR FAMILY LEAVE FORMS, YOU MUST BRING THEM TO THE OFFICE FOR PROCESSING.   DO NOT GIVE THEM TO YOUR DOCTOR.  1. A  prescription for pain medication may be given to you upon discharge.  Take your pain medication as prescribed, if needed.  If narcotic pain medicine is not needed, then you may take acetaminophen (Tylenol) or ibuprofen (Advil) as needed.While using narcotic pain medication be sure to take stool softeners and drink 64oz of water daily. No driving while taking narcotic pain medications. 2. Take your usually prescribed medications unless otherwise directed. If you need a refill on your pain medication, please contact your pharmacy.  They will contact our office to request authorization. Prescriptions will not be filled after 5 pm or on week-ends. 3. You should follow a light diet the first 24 hours after arrival home, such as soup and crackers, etc.  Be sure to include lots of fluids daily.  Resume your normal diet the day after surgery. 4.Most patients will experience some swelling and bruising around the incision.  Ice packs and reclining will help.  Swelling and bruising can take several days to resolve.  6. It is common to experience some constipation if taking pain medication after surgery.  Increasing fluid intake and taking a stool softener (such as Colace) will usually help or prevent this problem from occurring.  A mild laxative (Milk of Magnesia or Miralax) should be taken according to package directions if there are no bowel movements after 48 hours. 7. Unless discharge instructions indicate otherwise, you may remove your bandages 24-48 hours after surgery, and you may shower at that time.  You may have steri-strips (small skin tapes) in place directly over the incision.  These strips should be left on the skin for 7-10 days.  If your surgeon used skin  glue on the incision, you may shower in 24 hours.  The glue will flake off over the next 2-3 weeks.  Any sutures or staples will be removed at the office during your follow-up visit. 8. ACTIVITIES:  You may resume regular (light) daily activities beginning the next day--such as daily self-care, walking, climbing stairs--gradually increasing activities as tolerated.    a.You may drive when you are no longer taking prescription pain medication, you can comfortably wear a seatbelt, and you can safely maneuver your car and apply brakes. b.RETURN TO WORK:  1 week _____________________________________________  9.You should see your doctor in the office for a follow-up appointment approximately 2-3 weeks after your surgery.  Make sure that you call for this appointment within a day or two after you arrive home to insure a convenient appointment time. 10.OTHER INSTRUCTIONS: _________________________    _____________________________________  WHEN TO CALL YOUR DOCTOR: 1. Fever over 101.0 2. Inability to urinate 3. Nausea and/or vomiting 4. Extreme swelling or bruising 5. Continued bleeding from incision. 6. Increased pain, redness, or drainage from the incision  The clinic staff is available to answer your questions during regular business hours.  Please dont hesitate to call and ask to speak to one of the nurses for clinical concerns.  If you have a medical emergency, go to the nearest emergency room or call 911.  A surgeon from [facility] is always on call at the hospital   [address], [place], [place], [place]  [address] ?  [address] [address], [place], [place]  [address] [phone] ? [phone] ? FAX [phone] Web site: [email or web address]

## 2016-09-15 NOTE — Anesthesia Postprocedure Evaluation (Signed)
Anesthesia Post Note  Patient: Darlene Bennett  Procedure(s) Performed: Procedure(s) (LRB): EXCISION POSTERIOR SCALP CYST (N/A)  Patient location during evaluation: PACU Anesthesia Type: General Level of consciousness: awake and alert Pain management: pain level controlled Vital Signs Assessment: post-procedure vital signs reviewed and stable Respiratory status: spontaneous breathing, nonlabored ventilation, respiratory function stable and patient connected to nasal cannula oxygen Cardiovascular status: blood pressure returned to baseline and stable Postop Assessment: no signs of nausea or vomiting Anesthetic complications: no    Last Vitals:  Vitals:   09/15/16 1105 09/15/16 1106  BP: 135/80 135/80  Pulse: 64 61  Resp: 16 15  Temp:  36.8 C    Last Pain:  Vitals:   09/15/16 1045  TempSrc:   PainSc: 0-No pain                 Effie Berkshire

## 2016-10-14 ENCOUNTER — Ambulatory Visit: Payer: Medicaid Other

## 2016-10-21 ENCOUNTER — Ambulatory Visit (INDEPENDENT_AMBULATORY_CARE_PROVIDER_SITE_OTHER): Payer: Medicaid Other | Admitting: *Deleted

## 2016-10-21 DIAGNOSIS — Z3040 Encounter for surveillance of contraceptives, unspecified: Secondary | ICD-10-CM

## 2016-10-21 DIAGNOSIS — Z3042 Encounter for surveillance of injectable contraceptive: Secondary | ICD-10-CM

## 2016-10-21 MED ORDER — MEDROXYPROGESTERONE ACETATE 150 MG/ML IM SUSP
150.0000 mg | Freq: Once | INTRAMUSCULAR | Status: AC
  Administered 2016-10-21: 150 mg via INTRAMUSCULAR

## 2016-10-21 NOTE — Progress Notes (Signed)
   Pt in for Depo Provera injection.  Pt tolerated Depo injection. Depo given left upper outer quadrant.  Next injection due Feb. 2-16, 2018.  Reminder card given. Derl Barrow, RN

## 2016-12-19 ENCOUNTER — Emergency Department (HOSPITAL_COMMUNITY)
Admission: EM | Admit: 2016-12-19 | Discharge: 2016-12-19 | Disposition: A | Discharge status: Home / Self Care | Payer: Medicaid Other | Attending: Emergency Medicine | Admitting: Emergency Medicine

## 2016-12-19 ENCOUNTER — Encounter (HOSPITAL_COMMUNITY): Payer: Self-pay | Admitting: Emergency Medicine

## 2016-12-19 DIAGNOSIS — R6883 Chills (without fever): Secondary | ICD-10-CM | POA: Diagnosis not present

## 2016-12-19 DIAGNOSIS — R519 Headache, unspecified: Secondary | ICD-10-CM

## 2016-12-19 DIAGNOSIS — R51 Headache: Secondary | ICD-10-CM | POA: Insufficient documentation

## 2016-12-19 DIAGNOSIS — R0981 Nasal congestion: Secondary | ICD-10-CM | POA: Diagnosis not present

## 2016-12-19 LAB — CBG MONITORING, ED
GLUCOSE-CAPILLARY: 99 mg/dL (ref 65–99)
Glucose-Capillary: 89 mg/dL (ref 65–99)

## 2016-12-19 MED ORDER — NAPROXEN 250 MG PO TABS
500.0000 mg | ORAL_TABLET | Freq: Once | ORAL | Status: AC
  Administered 2016-12-19: 500 mg via ORAL
  Filled 2016-12-19: qty 2

## 2016-12-19 MED ORDER — NAPROXEN 500 MG PO TABS: 500.0000 mg | ORAL_TABLET | Freq: Two times a day (BID) | ORAL | 0 refills | Status: DC

## 2016-12-19 MED ORDER — OXYMETAZOLINE HCL 0.05 % NA SOLN
1.0000 | Freq: Once | NASAL | Status: AC
  Administered 2016-12-19: 1 via NASAL
  Filled 2016-12-19: qty 15

## 2016-12-19 NOTE — ED Provider Notes (Signed)
I saw and evaluated the patient, reviewed the resident's note and I agree with the findings and plan with the following exceptions.    Here with a few days of congestions but this morning had some shaking without fever or chills. Resolved PTA, now with persistent congestion and nasal drainage.  On my exam, neuro intact. No tremors. No sinus tenderness. Has some nasal congestion and dried mucuous. Lungs clear.  Did have cbg in 70's, and apparently is low for her. Possibly the cause if it was that low or lower earlier. Doubt sepsis.  Will treat URI symptomatically.   Merrily Pew, MD 12/20/16 [phone]

## 2016-12-19 NOTE — ED Provider Notes (Signed)
[facility] DEPT Provider Note  CSN: [id number] Arrival date & time: 12/19/16  1315  History   Chief Complaint Chief Complaint  Patient presents with  . Chills   HPI Darlene Bennett is a 41 y.o. female.  The history is provided by the patient and medical records. No language interpreter was used.  Illness  This is a new problem. The current episode started more than 2 days ago. The problem occurs constantly. The problem has been gradually worsening. Associated symptoms include headaches. Pertinent negatives include no chest pain, no abdominal pain and no shortness of breath. Nothing aggravates the symptoms. Nothing relieves the symptoms.    Past Medical History:  Diagnosis Date  . Gestational diabetes    insulin  . Gestational diabetes mellitus, antepartum 02/14/2013  . History of gestational diabetes   . Hypertension    preeclampsia with previous pregnancy  . Scalp cyst    Patient Active Problem List   Diagnosis Date Noted  . Epidermal cyst 08/04/2016  . Preventative health care 02/20/2016  . Contraceptive management 05/25/2015  . Type 2 diabetes mellitus ([place]) 04/24/2015  . Microalbuminuria due to type 2 diabetes mellitus ([place]) 03/25/2015  . Hyperlipemia 03/09/2015  . Tobacco use disorder 03/09/2015  . Essential hypertension 11/15/2012   Past Surgical History:  Procedure Laterality Date  . CYST EXCISION N/A 09/15/2016   Procedure: EXCISION POSTERIOR SCALP CYST;  Surgeon: Clovis Riley, MD;  Location: [facility] ORS;  Service: General;  Laterality: N/A;  . TUBAL LIGATION     2007   OB History    Gravida Para Term Preterm AB Living   _0 0 4   SAB TAB Ectopic Multiple Live Births   0 0 0 0 4      Home Medications    Prior to Admission medications   Medication Sig Start Date End Date Taking? Authorizing Provider  ACCU-CHEK FASTCLIX LANCETS MISC 1 each by Does not apply route 4 (four) times daily -  before meals and at bedtime. Check sugar 6 x daily 03/09/15   Katheren Shams, DO  aspirin EC 81 MG tablet Take 1 tablet (81 mg total) by mouth daily. 08/02/16   Katheren Shams, DO  atorvastatin (LIPITOR) 40 MG tablet Take 1 tablet (40 mg total) by mouth daily. Patient taking differently: Take 40 mg by mouth at bedtime.  08/02/16   Katheren Shams, DO  B-D INS SYR ULTRAFINE 1CC/30G 30G X 1/2" 1 ML MISC USE AS INDICATED WITH INSULIN. 10/02/15   Katheren Shams, DO  B-D ULTRAFINE III SHORT PEN 31G X 8 MM MISC USE AS DIRECTED 06/10/16   Katheren Shams, DO  Blood Glucose Monitoring Suppl (ACCU-CHEK NANO SMARTVIEW) W/DEVICE KIT 1 Device by Does not apply route 4 (four) times daily -  before meals and at bedtime. 03/09/15   Katheren Shams, DO  Blood Pressure KIT Use device to monitor blood pressures daily. Patient not taking: Reported on 09/08/2016 02/17/16   Katheren Shams, DO  glucose blood (ACCU-CHEK AVIVA PLUS) test strip 1 each by Other route 2 (two) times daily. Use twice daily to monitor blood sugars. 04/11/16   Alta N Rumley, DO  Insulin Glargine (LANTUS SOLOSTAR) 100 UNIT/ML Solostar Pen Inject 15 Units into the skin at bedtime. 08/02/16   Katheren Shams, DO  lisinopril-hydrochlorothiazide (ZESTORETIC) 20-12.5 MG tablet Take 1 tablet by mouth daily. 08/02/16   Katheren Shams, DO  medroxyPROGESTERone (DEPO-PROVERA) 150 MG/ML injection Inject 1  mL (150 mg total) into the muscle every 3 (three) months. 05/10/13   Deirdre Freida Busman, CNM  metFORMIN (GLUCOPHAGE) 1000 MG tablet TAKE 1 TABLET (1,000 MG TOTAL) BY MOUTH 2 (TWO) TIMES DAILY WITH A MEAL. 08/02/16   Katheren Shams, DO  naproxen (NAPROSYN) 500 MG tablet Take 1 tablet (500 mg total) by mouth 2 (two) times daily with a meal. 12/19/16   Mayer Camel, MD  oxyCODONE-acetaminophen (PERCOCET/ROXICET) 5-325 MG tablet Take 1 tablet by mouth every 6 (six) hours as needed for severe pain. 09/15/16   Clovis Riley, MD   Family History Family History  Problem Relation Age of Onset  . Heart disease Mother   . Hypertension Mother   .  Diabetes Mother   . Hypertension Father   . Diabetes Father    Social History Social History  Substance Use Topics  . Smoking status: Current Every Day Smoker    Packs/day: 0.50    Years: 22.00    Types: Cigarettes  . Smokeless tobacco: Never Used  . Alcohol use No    Allergies   Patient has no known allergies.  Review of Systems Review of Systems  Constitutional: Positive for chills. Negative for appetite change and fever.  HENT: Positive for congestion. Negative for postnasal drip and rhinorrhea.   Respiratory: Negative for cough and shortness of breath.   Cardiovascular: Negative for chest pain.  Gastrointestinal: Negative for abdominal pain, diarrhea, nausea and vomiting.  Neurological: Positive for headaches.  All other systems reviewed and are negative.   Physical Exam Updated Vital Signs BP 157/93 (BP Location: Right Arm)   Pulse 82   Temp 99 F (37.2 C) (Oral)   Resp 18   SpO2 100%   Physical Exam  Constitutional: She is oriented to person, place, and time. No distress.  Overweight middle-aged 51 female  HENT:  Head: Normocephalic and atraumatic.  Mild mucosal edema bilaterally  Eyes: EOM are normal. Pupils are equal, round, and reactive to light.  Neck: Normal range of motion. Neck supple.  Cardiovascular: Normal rate, regular rhythm and normal heart sounds.   Pulmonary/Chest: Effort normal and breath sounds normal.  Abdominal: Soft. Bowel sounds are normal. She exhibits no distension. There is no tenderness.  Musculoskeletal: Normal range of motion.  Neurological: She is alert and oriented to person, place, and time.  Skin: Skin is warm and dry. Capillary refill takes less than 2 seconds. She is not diaphoretic.  Nursing note and vitals reviewed.   ED Treatments / Results  Labs (all labs ordered are listed, but only abnormal results are displayed) Labs Reviewed  CBG MONITORING, ED   EKG  EKG Interpretation None      Radiology No  results found.  Procedures Procedures (including critical care time)  Medications Ordered in ED Medications  naproxen (NAPROSYN) tablet 500 mg (not administered)  oxymetazoline (AFRIN) 0.05 % nasal spray 1 spray (not administered)    Initial Impression / Assessment and Plan / ED Course  I have reviewed the triage vital signs and the nursing notes.  Pertinent labs & imaging results that were available during my care of the patient were reviewed by me and considered in my medical decision making (see chart for details).  Clinical Course   41 y.o. female with above stated PMHx, HPI, and physical. Nasal congestion times 4-5 days associated with mild generalized throbbing headache. Onset of chills this morning. Patient denies fever, nausea, vomiting, diarrhea, cough, visual changes, numbness, tingling, weakness, decreased by mouth  intake, urinary symptoms, vaginal bleeding, vaginal discharge.  Patient well-appearing and well-hydrated on exam. POCT glucose in 70's. Patient given orange juice & peanut butter. Suspect sinusitis vs early URI vs early influenza. Patient is less than 5 days of congestion, no indication for initiation of antibiotics for sinusitis. Patient middle-aged w/ DM & HTN but otherwise healthy, will forego flu testing has results would not change treatment. Pt given Afrin for nasal congestion and Naproxen for HA. Pt feeling better.  Pt discharged home in stable condition. Strict ED return precautions dicussed. Pt understands and agrees with the plan and has no further questions or concerns.   Pt care discussed with and followed by my attending, Dr. Merrily Pew  Mayer Camel, MD Pager [phone]  Final Clinical Impressions(s) / ED Diagnoses   Final diagnoses:  Chills  Nasal congestion  Throbbing headache    New Prescriptions New Prescriptions   NAPROXEN (NAPROSYN) 500 MG TABLET    Take 1 tablet (500 mg total) by mouth 2 (two) times daily with a meal.     Mayer Camel, MD 12/19/16 [place], MD 12/20/16 [id number]

## 2016-12-19 NOTE — ED Triage Notes (Addendum)
Pt states she woke up "shaking" has congestion. Has eaten today- is diabetic. She says doesn't feel like sugar low. States she has nasal congestion. Denies cough or sore throat. RN observes no distress; no shaking.

## 2016-12-19 NOTE — ED Notes (Signed)
Pt verbalized understanding discharge instructions and denies any further needs or questions at this time. VS stable, ambulatory and steady gait.   

## 2017-01-11 ENCOUNTER — Telehealth: Payer: Self-pay | Admitting: *Deleted

## 2017-01-11 ENCOUNTER — Ambulatory Visit (INDEPENDENT_AMBULATORY_CARE_PROVIDER_SITE_OTHER): Payer: Medicaid Other | Admitting: Obstetrics and Gynecology

## 2017-01-11 ENCOUNTER — Encounter: Payer: Self-pay | Admitting: Obstetrics and Gynecology

## 2017-01-11 VITALS — BP 120/64 | HR 90 | Temp 98.8°F | Ht 72.0 in | Wt 266.0 lb

## 2017-01-11 DIAGNOSIS — Z3042 Encounter for surveillance of injectable contraceptive: Secondary | ICD-10-CM | POA: Diagnosis not present

## 2017-01-11 DIAGNOSIS — Z3040 Encounter for surveillance of contraceptives, unspecified: Secondary | ICD-10-CM

## 2017-01-11 DIAGNOSIS — E785 Hyperlipidemia, unspecified: Secondary | ICD-10-CM

## 2017-01-11 DIAGNOSIS — E118 Type 2 diabetes mellitus with unspecified complications: Secondary | ICD-10-CM

## 2017-01-11 LAB — POCT GLYCOSYLATED HEMOGLOBIN (HGB A1C): Hemoglobin A1C: 6.3

## 2017-01-11 MED ORDER — EMPAGLIFLOZIN 10 MG PO TABS: 10.0000 mg | ORAL_TABLET | Freq: Every day | ORAL | 0 refills | Status: DC

## 2017-01-11 MED ORDER — EMPAGLIFLOZIN-METFORMIN HCL 5-1000 MG PO TABS: 1.0000 | ORAL_TABLET | Freq: Two times a day (BID) | ORAL | 1 refills | Status: DC

## 2017-01-11 MED ORDER — MEDROXYPROGESTERONE ACETATE 150 MG/ML IM SUSY
150.0000 mg | PREFILLED_SYRINGE | Freq: Once | INTRAMUSCULAR | Status: AC
  Administered 2017-01-11: 150 mg via INTRAMUSCULAR

## 2017-01-11 MED ORDER — METFORMIN HCL 1000 MG PO TABS: ORAL_TABLET | ORAL | 2 refills | Status: DC

## 2017-01-11 NOTE — Assessment & Plan Note (Signed)
Depo injection today. Follow-up in 3 months

## 2017-01-11 NOTE — Assessment & Plan Note (Addendum)
A1c continues to be well controlled. A1c improved to 6.3. Had stopped short acting insulin at last visit and patient with continued good control. Wants to try and come off Lantus. Patient would be a good option for this. Continue Metformin 100mg  BID and will add Jardiance 10mg  daily. Discontinue Lantus. Counseled patient extensively on medication side effects and blood sugar monitoring. Foot exam performed today. She is to call in 1-2 weeks with CBG results over the last couple of days. Follow-up clinic appointment in 1 month.

## 2017-01-11 NOTE — Telephone Encounter (Signed)
Prior Authorization [facility] for [place]. Formulary and PA form placed in provider box for completion. Derl Barrow, RN

## 2017-01-11 NOTE — Telephone Encounter (Signed)
Pt would like to get the Prior Authorization taken care of as soon as possible. Thanks. ep

## 2017-01-11 NOTE — Assessment & Plan Note (Signed)
Continue therapy

## 2017-01-11 NOTE — Patient Instructions (Addendum)
Diabetes medication changed. Goal fasting sugars <150. May need to increase medications Let me know if any side effects such as nausea/vomiting A1c today 6.3 Depo injection given. Return April 25th-May9th  Follow-in one month with appointment but call me within 2 weeks with fasting sugars

## 2017-01-11 NOTE — Telephone Encounter (Signed)
Called and got medication corrected. Will follow-up with patient. Thank you

## 2017-01-11 NOTE — Progress Notes (Signed)
Subjective: Chief Complaint  Patient presents with  . Diabetes  . Contraception    HPI: Darlene Bennett is a 41 y.o. presenting to clinic today to discuss the following:  #Diabetes:  Last A1c 6.7 Sugars - CBGs range 110-120, fasting 88 on average Taking medications: metformin and Lantus 15U Side effects: yes has had some hypoglycemic events. Went to ED for one episode On Aspirin On statin Last foot exam: Due Wants to try and come off insulin ROS: denies fever, chills, dizziness, LOC, polyuria, polydipsia, numbness or tingling in extremities or chest pain, visual disturbances.  #Contraception On Depo Needs injection today  #HLD Compliant with medication  ROS noted in HPI.   Past Medical, Surgical, Social, and Family History Reviewed & Updated per EMR.   Pertinent Historical Findings include:   History  Smoking Status  . Current Every Day Smoker  . Packs/day: 0.50  . Years: 22.00  . Types: Cigarettes  Smokeless Tobacco  . Never Used    Objective: BP 120/64   Pulse 90   Temp 98.8 F (37.1 C) (Oral)   Ht 6' (1.829 m)   Wt 266 lb (120.7 kg)   SpO2 97%   BMI 36.08 kg/m  Vitals and nursing notes reviewed  Physical Exam  Constitutional: She is oriented to person, place, and time and well-developed, well-nourished, and in no distress.  obese  Cardiovascular: Normal rate, regular rhythm and normal heart sounds.   Pulmonary/Chest: Effort normal and breath sounds normal.  Musculoskeletal: Normal range of motion. She exhibits no edema or deformity.  Neurological: She is alert and oriented to person, place, and time.  Skin: Skin is warm and dry. No rash noted.    Diabetic Foot Exam - Simple   Simple Foot Form Diabetic Foot exam was performed with the following findings:  Yes 01/11/2017  2:58 PM  Visual Inspection No deformities, no ulcerations, no other skin breakdown bilaterally:  Yes Sensation Testing See comments:  Yes Pulse Check Posterior Tibialis and  Dorsalis pulse intact bilaterally:  Yes Comments No sensation to monofilament testing in bilateral heels     Results for orders placed or performed in visit on 01/11/17 (from the past 72 hour(s))  HgB A1c     Status: None   Collection Time: 01/11/17  1:56 PM  Result Value Ref Range   Hemoglobin A1C 6.3     Assessment/Plan: Please see problem based Assessment and Plan PATIENT EDUCATION PROVIDED: See AVS    Diagnosis and plan along with any newly prescribed medication(s) were discussed in detail with this patient today. The patient verbalized understanding and agreed with the plan. Patient advised if symptoms worsen return to clinic or ER.    Orders Placed This Encounter  Procedures  . HgB A1c    Meds ordered this encounter  Medications  . DISCONTD: Empagliflozin-Metformin HCl 04-999 MG TABS    Sig: Take 1 tablet by mouth 2 (two) times daily.    Dispense:  60 tablet    Refill:  1  . MedroxyPROGESTERone Acetate SUSY 150 mg  . metFORMIN (GLUCOPHAGE) 1000 MG tablet    Sig: TAKE 1 TABLET (1,000 MG TOTAL) BY MOUTH 2 (TWO) TIMES DAILY WITH A MEAL.    Dispense:  180 tablet    Refill:  2  . empagliflozin (JARDIANCE) 10 MG TABS tablet    Sig: Take 10 mg by mouth daily.    Dispense:  30 tablet    Refill:  0    Luiz Blare,  DO 01/11/2017, 2:10 PM PGY-3, [place]

## 2017-01-17 ENCOUNTER — Telehealth: Payer: Self-pay | Admitting: Obstetrics and Gynecology

## 2017-01-17 NOTE — Telephone Encounter (Signed)
Pt wants to know if she should take jardiance and the metformin mixed with jardiance. Please advise. ep

## 2017-01-17 NOTE — Telephone Encounter (Signed)
Return call to patient regarding the Jardiance and metformin.  Advised patient she should be taking Jardiance 10 mg once daily and Metformin 1000 mg 1 tablet BID.  Patient voice understanding and repeated the directions back.  Derl Barrow, RN

## 2017-02-09 ENCOUNTER — Ambulatory Visit (INDEPENDENT_AMBULATORY_CARE_PROVIDER_SITE_OTHER): Payer: Medicaid Other | Admitting: Obstetrics and Gynecology

## 2017-02-09 ENCOUNTER — Encounter: Payer: Self-pay | Admitting: Obstetrics and Gynecology

## 2017-02-09 VITALS — BP 124/82 | HR 90 | Temp 98.7°F | Wt 270.0 lb

## 2017-02-09 DIAGNOSIS — E119 Type 2 diabetes mellitus without complications: Secondary | ICD-10-CM

## 2017-02-09 NOTE — Progress Notes (Signed)
     Subjective: Chief Complaint  Patient presents with  . Medication Refill     HPI: Darlene Bennett is a 41 y.o. presenting to clinic today to discuss the following:  #DM Patient her to follow-up on her diabetes. Her last visit a month ago patient was discontinued from Lantus. She was to continue her 1000 mg twice a day metformin and Jardiance was added. Patient states that she is tolerating medications well. She brought in her sugars have been well controlled. Ranges between 90-156 over the last 4 weeks; as her fasting blood sugars. Patient denies any symptoms of hypoglycemia. Denies vision changes, polyuria, polydipsia, and fevers. Continues to work on dietary modifications and lifestyle changes.   No other concerns voiced today  Health Maintenance Due  Topic Date Due  . PNEUMOCOCCAL POLYSACCHARIDE VACCINE (1) 01/05/1978   ROS noted in HPI.   Past Medical, Surgical, Social, and Family History Reviewed & Updated per EMR.   Pertinent Historical Findings include: HTN, DM, and HLD   History  Smoking Status  . Current Every Day Smoker  . Packs/day: 0.50  . Years: 22.00  . Types: Cigarettes  Smokeless Tobacco  . Never Used    Objective: BP 124/82   Pulse 90   Temp 98.7 F (37.1 C) (Oral)   Wt 270 lb (122.5 kg)   LMP  (LMP Unknown)   SpO2 99%   BMI 36.62 kg/m  Vitals and nursing notes reviewed  Physical Exam  Constitutional: She is well-developed, well-nourished, and in no distress.  Cardiovascular: Normal rate, regular rhythm and normal heart sounds.   Pulmonary/Chest: Effort normal and breath sounds normal.    No results found for this or any previous visit (from the past 72 hour(s)).  Assessment/Plan: Please see problem based Assessment and Plan PATIENT EDUCATION PROVIDED: See AVS    Luiz Blare, DO 02/09/2017, 2:16 PM PGY-3, [place]

## 2017-02-10 NOTE — Assessment & Plan Note (Signed)
Follow-up after medication changes. Patient doing well off the Lantus. Continue metformin and Jardiance. Follow-up in 2 months for next A1c checked. Will need Pneumococcal vaccine at that time.

## 2017-03-31 ENCOUNTER — Ambulatory Visit (INDEPENDENT_AMBULATORY_CARE_PROVIDER_SITE_OTHER): Payer: Medicaid Other | Admitting: *Deleted

## 2017-03-31 DIAGNOSIS — Z3042 Encounter for surveillance of injectable contraceptive: Secondary | ICD-10-CM

## 2017-03-31 MED ORDER — MEDROXYPROGESTERONE ACETATE 150 MG/ML IM SUSP
150.0000 mg | Freq: Once | INTRAMUSCULAR | Status: AC
  Administered 2017-03-31: 150 mg via INTRAMUSCULAR

## 2017-03-31 NOTE — Progress Notes (Signed)
   Pt in for Depo Provera injection.  Pt tolerated Depo injection. Depo given right upper outer quadrant.  Next injection due July 13-27, 2018.  Reminder card given. Derl Barrow, RN

## 2017-05-11 ENCOUNTER — Other Ambulatory Visit: Payer: Self-pay | Admitting: Obstetrics and Gynecology

## 2017-05-11 DIAGNOSIS — E119 Type 2 diabetes mellitus without complications: Secondary | ICD-10-CM

## 2017-05-11 MED ORDER — EMPAGLIFLOZIN 10 MG PO TABS
10.0000 mg | ORAL_TABLET | Freq: Every day | ORAL | 0 refills | Status: DC
Start: 2017-05-11 — End: 2017-09-04

## 2017-05-11 MED ORDER — ASPIRIN EC 81 MG PO TBEC: 81.0000 mg | DELAYED_RELEASE_TABLET | Freq: Every day | ORAL | 3 refills | Status: DC

## 2017-05-11 MED ORDER — INSULIN PEN NEEDLE 31G X 8 MM MISC: 2 refills | Status: DC

## 2017-05-11 MED ORDER — LISINOPRIL-HYDROCHLOROTHIAZIDE 20-12.5 MG PO TABS: 1.0000 | ORAL_TABLET | Freq: Every day | ORAL | 1 refills | Status: DC

## 2017-05-11 MED ORDER — GLUCOSE BLOOD VI STRP: 1.0000 | ORAL_STRIP | Freq: Two times a day (BID) | 12 refills | Status: DC

## 2017-05-11 MED ORDER — "INSULIN SYRINGE-NEEDLE U-100 30G X 1/2"" 1 ML MISC": 6 refills | Status: DC

## 2017-05-11 MED ORDER — ACCU-CHEK FASTCLIX LANCETS MISC: 1.0000 | Freq: Three times a day (TID) | 3 refills | Status: DC

## 2017-05-11 MED ORDER — METFORMIN HCL 1000 MG PO TABS: ORAL_TABLET | ORAL | 2 refills | Status: DC

## 2017-05-11 MED ORDER — ATORVASTATIN CALCIUM 40 MG PO TABS: 40.0000 mg | ORAL_TABLET | Freq: Every day | ORAL | 3 refills | Status: DC

## 2017-05-11 NOTE — Telephone Encounter (Signed)
Will not refill pain medications since I did not prescribe them. They were given in an ED visit over 3 months ago. Patient can try OTC pain medications or if she needs to come in for visit she can.

## 2017-05-11 NOTE — Telephone Encounter (Signed)
Pt needs refills on everything. Lancets, aspirin, atorvastatin, syringes, short pen, jardiance, test strips, lisinopril-hydrochlorothiazide, metformin, naproxen, and oxycodone. [facility] [address]

## 2017-05-15 ENCOUNTER — Telehealth: Payer: Self-pay | Admitting: *Deleted

## 2017-05-15 ENCOUNTER — Other Ambulatory Visit: Payer: Self-pay | Admitting: Internal Medicine

## 2017-05-15 MED ORDER — EMPAGLIFLOZIN-METFORMIN HCL 5-1000 MG PO TABS: 1.0000 | ORAL_TABLET | Freq: Two times a day (BID) | ORAL | 0 refills | Status: DC

## 2017-05-15 NOTE — Telephone Encounter (Signed)
Sent prescription for [facility] into pharmacy. I called patient to let her know.

## 2017-05-15 NOTE — Telephone Encounter (Signed)
Patient requesting refill of Synjardy #60 1 tab bid [facility] [address]. Not on current or historical med list. Patient had been taking Jardiance 10 mg daily and metformin 1000 mg bid but states received rx for synjardy in March and insurance covers it. Hubbard Hartshorn, RN, BSN

## 2017-06-27 ENCOUNTER — Ambulatory Visit (INDEPENDENT_AMBULATORY_CARE_PROVIDER_SITE_OTHER): Payer: Medicaid Other | Admitting: *Deleted

## 2017-06-27 DIAGNOSIS — Z3042 Encounter for surveillance of injectable contraceptive: Secondary | ICD-10-CM | POA: Diagnosis not present

## 2017-06-27 MED ORDER — MEDROXYPROGESTERONE ACETATE 150 MG/ML IM SUSP
150.0000 mg | Freq: Once | INTRAMUSCULAR | Status: AC
  Administered 2017-06-27: 150 mg via INTRAMUSCULAR

## 2017-06-27 NOTE — Progress Notes (Signed)
   Pt in for Depo Provera injection.  Pt tolerated Depo injection. Depo given left upper outer quadrant.  Next injection due Oct 9-23, 2018.  Reminder card given. Need to follow up with new PCP for new Depo Provera order and diabetes follow up.   Derl Barrow, RN

## 2017-08-31 ENCOUNTER — Other Ambulatory Visit: Payer: Self-pay | Admitting: Internal Medicine

## 2017-08-31 NOTE — Telephone Encounter (Signed)
Patient has an appointment on 09/04/17. Darlene Bennett,CMA

## 2017-08-31 NOTE — Telephone Encounter (Signed)
Patient needs appointment for DM follow up, can she please be called to schedule.

## 2017-09-04 ENCOUNTER — Ambulatory Visit (INDEPENDENT_AMBULATORY_CARE_PROVIDER_SITE_OTHER): Payer: Medicaid Other | Admitting: Family Medicine

## 2017-09-04 ENCOUNTER — Encounter: Payer: Self-pay | Admitting: Family Medicine

## 2017-09-04 VITALS — BP 140/75 | HR 74 | Temp 98.2°F | Ht 72.0 in | Wt 272.4 lb

## 2017-09-04 DIAGNOSIS — I10 Essential (primary) hypertension: Secondary | ICD-10-CM | POA: Diagnosis not present

## 2017-09-04 DIAGNOSIS — Z3042 Encounter for surveillance of injectable contraceptive: Secondary | ICD-10-CM

## 2017-09-04 DIAGNOSIS — E119 Type 2 diabetes mellitus without complications: Secondary | ICD-10-CM

## 2017-09-04 DIAGNOSIS — F17200 Nicotine dependence, unspecified, uncomplicated: Secondary | ICD-10-CM | POA: Diagnosis not present

## 2017-09-04 DIAGNOSIS — E785 Hyperlipidemia, unspecified: Secondary | ICD-10-CM | POA: Diagnosis not present

## 2017-09-04 LAB — POCT GLYCOSYLATED HEMOGLOBIN (HGB A1C): HEMOGLOBIN A1C: 6.9

## 2017-09-04 MED ORDER — LISINOPRIL-HYDROCHLOROTHIAZIDE 20-12.5 MG PO TABS: 1.0000 | ORAL_TABLET | Freq: Every day | ORAL | 1 refills | Status: DC

## 2017-09-04 MED ORDER — MEDROXYPROGESTERONE ACETATE 150 MG/ML IM SUSY: 150.0000 mg | PREFILLED_SYRINGE | Freq: Once | INTRAMUSCULAR | Status: DC

## 2017-09-04 MED ORDER — MEDROXYPROGESTERONE ACETATE 150 MG/ML IM SUSP: 150.0000 mg | Freq: Once | INTRAMUSCULAR | Status: DC

## 2017-09-04 MED ORDER — EMPAGLIFLOZIN-METFORMIN HCL 5-1000 MG PO TABS: 1.0000 | ORAL_TABLET | Freq: Two times a day (BID) | ORAL | 0 refills | Status: DC

## 2017-09-04 MED ORDER — GLUCOSE BLOOD VI STRP: 1.0000 | ORAL_STRIP | Freq: Two times a day (BID) | 12 refills | Status: DC

## 2017-09-04 MED ORDER — MEDROXYPROGESTERONE ACETATE 150 MG/ML IM SUSY
150.0000 mg | PREFILLED_SYRINGE | Freq: Once | INTRAMUSCULAR | Status: AC
  Administered 2017-09-04: 150 mg via INTRAMUSCULAR

## 2017-09-04 MED ORDER — ATORVASTATIN CALCIUM 40 MG PO TABS: 40.0000 mg | ORAL_TABLET | Freq: Every day | ORAL | 3 refills | Status: DC

## 2017-09-04 NOTE — Assessment & Plan Note (Signed)
Stable, doing well on atorvastatin 40mg  qd. Rx refilled and recheck lipid panel today.

## 2017-09-04 NOTE — Assessment & Plan Note (Signed)
Encouraged patient efforts for smoking cessation though she is still in contemplative stage. Patient declined any NRT or smoking cessation aids or counseling.

## 2017-09-04 NOTE — Assessment & Plan Note (Addendum)
Stable. Doing well on synjardy (empagliflozin-metformin 5-1000mg  BD). A1c did trend upward slight from 6.3 to 6.9 so discussed lifestyle modifications with patient today. Patient opted to cut down on soda intake as her main goal.  - Continue current medication regimen - Patient to contact her opthalmology for DM eye exam - Declined pneumonia vaccine today

## 2017-09-04 NOTE — Progress Notes (Signed)
Depo was given in Right upper quadrant and patient tolerated it well.  Shot was given 8 days early per Dr. Shawna Orleans.Darlene Bennett

## 2017-09-04 NOTE — Assessment & Plan Note (Addendum)
Stable. Patient is at goal today. Continue lisinopril-HCTZ 20-12.5mg  qd.

## 2017-09-04 NOTE — Patient Instructions (Addendum)
It was good to see you today!  No changes to medications today. Things sound like they are going well. We talked about decreasing your soda intake and cutting down more on smoking. You can do it!   Your next depo shot is in 3 months.  We are checking some labs today. If results require attention, either myself or my nurse will get in touch with you. If everything is normal, you will get a letter in the mail or a message in My Chart. Please give [place] a call if you do not hear from [place] after 2 weeks.   Please check-out at the front desk before leaving the clinic. Make an appointment in   70months for DM/HTN follow up.  Please bring all of your medications with you to each visit.   Sign up for My Chart to have easy access to your labs results, and communication with your primary care physician.  Feel free to call with any questions or concerns at any time, at [phone].   Take care,  Dr. Bufford Lope, [place]

## 2017-09-04 NOTE — Progress Notes (Deleted)
    Subjective:  Darlene Bennett is a 41 y.o. female who presents to the [facility] today with a chief complaint of ***.   HPI:   ***HIST  Objective:  Physical Exam: BP 140/75 (BP Location: Right Arm, Patient Position: Sitting, Cuff Size: Normal)   Pulse 74   Temp 98.2 F (36.8 C) (Oral)   Ht 6' (1.829 m)   Wt 272 lb 6.4 oz (123.6 kg)   SpO2 99%   BMI 36.94 kg/m   Gen: ***NAD, resting comfortably CV: RRR with no murmurs appreciated Pulm: NWOB, CTAB with no crackles, wheezes, or rhonchi GI: Normal bowel sounds present. Soft, Nontender, Nondistended. MSK: no edema, cyanosis, or clubbing noted Skin: warm, dry Neuro: grossly normal, moves all extremities Psych: Normal affect and thought content  Results for orders placed or performed in visit on 09/04/17 (from the past 72 hour(s))  HgB A1c     Status: None   Collection Time: 09/04/17  9:45 AM  Result Value Ref Range   Hemoglobin A1C 6.9      Assessment/Plan:  No problem-specific Assessment & Plan notes found for this encounter.   Bufford Lope, DO PGY-2, [facility] 09/04/2017 10:13 AM

## 2017-09-04 NOTE — Progress Notes (Signed)
    Subjective:  Darlene Bennett is a 41 y.o. female who presents to the [facility] today for diabetes/HTN follow up  HPI: HYPERTENSION  Disease Monitoring not done Blood pressure range-does not check at home Chest pain- no      Dyspnea- no Medications  Compliance- good Lightheadedness- no   Edema- no      DIABETES  Disease Monitoring:  Blood Sugar ranges-am fasting 90-155, bedtime 110-150s Polyuria- no New Visual problems- no  Medications  Compliance- good Hypoglycemic symptoms- 79 once when was sick Diet is usually good but sometimes eats carbs about once a week No sweets. Sometimes drinks diet soda. DM eye exam: last was about 1 year ago[place], has contact information. No PNA vaccine   HYPERLIPIDEMIA  Disease Monitoring  See symptoms for Hypertension  Medications  Compliance- good RUQ pain- no  Muscle aches- no    ROS See HPI above   PMH Smoking Status noted. Smokes 1pack for 1.5wks, trying to quit. Was successful for 2 years in the past when had baby.   Objective:  Physical Exam: BP 140/75 (BP Location: Right Arm, Patient Position: Sitting, Cuff Size: Normal)   Pulse 74   Temp 98.2 F (36.8 C) (Oral)   Ht 6' (1.829 m)   Wt 272 lb 6.4 oz (123.6 kg)   SpO2 99%   BMI 36.94 kg/m   Gen: NAD, resting comfortably CV: RRR with no murmurs appreciated Pulm: NWOB, CTAB with no crackles, wheezes, or rhonchi GI: Normal bowel sounds present. Soft, Nontender, Nondistended. MSK: no edema, cyanosis, or clubbing noted Skin: warm, dry Neuro: grossly normal, moves all extremities Psych: Normal affect and thought content  Results for orders placed or performed in visit on 09/04/17 (from the past 72 hour(s))  HgB A1c     Status: None   Collection Time: 09/04/17  9:45 AM  Result Value Ref Range   Hemoglobin A1C 6.9      Assessment/Plan:  Tobacco use disorder Encouraged patient efforts for smoking cessation though she is still in contemplative stage. Patient declined  any NRT or smoking cessation aids or counseling.   Hyperlipemia Stable, doing well on atorvastatin 40mg  qd. Rx refilled and recheck lipid panel today.  Type 2 diabetes mellitus Stable. Doing well on synjardy (empagliflozin-metformin 5-1000mg  BD). A1c did trend upward slight from 6.3 to 6.9 so discussed lifestyle modifications with patient today. Patient opted to cut down on soda intake as her main goal.  - Continue current medication regimen - Patient to contact her opthalmology for DM eye exam - Declined pneumonia vaccine today    Essential hypertension Stable. Patient is at goal today. Continue lisinopril-HCTZ 20-12.5mg  qd.    Bufford Lope, DO PGY-2, [facility] 09/04/2017 10:20 AM

## 2017-09-05 ENCOUNTER — Encounter: Payer: Self-pay | Admitting: Family Medicine

## 2017-09-05 LAB — CBC
HEMOGLOBIN: 14.5 g/dL (ref 11.1–15.9)
Hematocrit: 42.6 % (ref 34.0–46.6)
MCH: 29.4 pg (ref 26.6–33.0)
MCHC: 34 g/dL (ref 31.5–35.7)
MCV: 86 fL (ref 79–97)
PLATELETS: 347 10*3/uL (ref 150–379)
RBC: 4.94 x10E6/uL (ref 3.77–5.28)
RDW: 13.5 % (ref 12.3–15.4)
WBC: 6.8 10*3/uL (ref 3.4–10.8)

## 2017-09-05 LAB — BASIC METABOLIC PANEL
BUN/Creatinine Ratio: 13 (ref 9–23)
BUN: 14 mg/dL (ref 6–24)
CO2: 22 mmol/L (ref 20–29)
Calcium: 9.9 mg/dL (ref 8.7–10.2)
Chloride: 108 mmol/L — ABNORMAL HIGH (ref 96–106)
Creatinine, Ser: 1.08 mg/dL — ABNORMAL HIGH (ref 0.57–1.00)
GFR calc Af Amer: 74 mL/min/{1.73_m2} (ref >59)
GFR, EST NON AFRICAN AMERICAN: 64 mL/min/{1.73_m2} (ref >59)
GLUCOSE: 102 mg/dL — AB (ref 65–99)
POTASSIUM: 4.3 mmol/L (ref 3.5–5.2)
SODIUM: 143 mmol/L (ref 134–144)

## 2017-09-05 LAB — LIPID PANEL
CHOL/HDL RATIO: 5.7 ratio — AB (ref 0.0–4.4)
Cholesterol, Total: 206 mg/dL — ABNORMAL HIGH (ref 100–199)
HDL: 36 mg/dL — ABNORMAL LOW (ref >39)
LDL Calculated: 143 mg/dL — ABNORMAL HIGH (ref 0–99)
TRIGLYCERIDES: 135 mg/dL (ref 0–149)
VLDL Cholesterol Cal: 27 mg/dL (ref 5–40)

## 2017-11-07 ENCOUNTER — Emergency Department (HOSPITAL_COMMUNITY): Payer: Medicaid Other

## 2017-11-07 ENCOUNTER — Emergency Department (HOSPITAL_COMMUNITY)
Admission: EM | Admit: 2017-11-07 | Discharge: 2017-11-07 | Disposition: A | Discharge status: Home / Self Care | Payer: Medicaid Other | Attending: Emergency Medicine | Admitting: Emergency Medicine

## 2017-11-07 ENCOUNTER — Other Ambulatory Visit: Payer: Self-pay

## 2017-11-07 ENCOUNTER — Encounter (HOSPITAL_COMMUNITY): Payer: Self-pay | Admitting: *Deleted

## 2017-11-07 DIAGNOSIS — R0789 Other chest pain: Secondary | ICD-10-CM | POA: Diagnosis present

## 2017-11-07 DIAGNOSIS — M940 Chondrocostal junction syndrome [Tietze]: Secondary | ICD-10-CM | POA: Diagnosis not present

## 2017-11-07 DIAGNOSIS — Z79899 Other long term (current) drug therapy: Secondary | ICD-10-CM | POA: Diagnosis not present

## 2017-11-07 DIAGNOSIS — R05 Cough: Secondary | ICD-10-CM | POA: Diagnosis not present

## 2017-11-07 DIAGNOSIS — I10 Essential (primary) hypertension: Secondary | ICD-10-CM | POA: Insufficient documentation

## 2017-11-07 DIAGNOSIS — J3489 Other specified disorders of nose and nasal sinuses: Secondary | ICD-10-CM | POA: Insufficient documentation

## 2017-11-07 DIAGNOSIS — B9789 Other viral agents as the cause of diseases classified elsewhere: Secondary | ICD-10-CM | POA: Diagnosis not present

## 2017-11-07 DIAGNOSIS — E119 Type 2 diabetes mellitus without complications: Secondary | ICD-10-CM | POA: Diagnosis not present

## 2017-11-07 DIAGNOSIS — Z7982 Long term (current) use of aspirin: Secondary | ICD-10-CM | POA: Insufficient documentation

## 2017-11-07 DIAGNOSIS — F17210 Nicotine dependence, cigarettes, uncomplicated: Secondary | ICD-10-CM | POA: Insufficient documentation

## 2017-11-07 DIAGNOSIS — Z7984 Long term (current) use of oral hypoglycemic drugs: Secondary | ICD-10-CM | POA: Diagnosis not present

## 2017-11-07 DIAGNOSIS — J069 Acute upper respiratory infection, unspecified: Secondary | ICD-10-CM | POA: Diagnosis not present

## 2017-11-07 MED ORDER — BENZONATATE 200 MG PO CAPS: 200.0000 mg | ORAL_CAPSULE | Freq: Three times a day (TID) | ORAL | 0 refills | Status: DC | PRN

## 2017-11-07 MED ORDER — NAPROXEN 500 MG PO TABS: 500.0000 mg | ORAL_TABLET | Freq: Two times a day (BID) | ORAL | 0 refills | Status: DC

## 2017-11-07 NOTE — ED Triage Notes (Signed)
Pt reports left side rib pain that radiates down left side and left leg. Denies injury. Denies n/v/d, denies urinary or vaginal symptoms. Pain is positional. No acute distress noted at triage.

## 2017-11-07 NOTE — ED Notes (Signed)
Patient denies pain and is resting comfortably.  

## 2017-11-07 NOTE — Discharge Instructions (Signed)
Please read attached information regarding your condition. Take Tessalon Perles as needed for cough. Take naproxen for pain and inflammation. Follow-up with your primary care provider for further evaluation. Return to ED for worsening pain, chest pain, shortness of breath, productive cough with fevers, coughing up blood.

## 2017-11-07 NOTE — ED Notes (Signed)
Patient transported to X-ray 

## 2017-11-07 NOTE — ED Provider Notes (Signed)
[facility] EMERGENCY DEPARTMENT Provider Note   CSN: [id number] Arrival date & time: 11/07/17  8889     History   Chief Complaint No chief complaint on file.   HPI Darlene Bennett is a 41 y.o. female with a past medical history of type 2 diabetes, hypertension, who presents to ED for evaluation of left rib pain radiating down to left side and left leg.  Describes it as an achy pain that is worse with movement.  States that symptoms began approximately 1 week ago.  She does report dry cough and rhinorrhea that she has been taking over-the-counter medication for.  She denies any injuries, falls, chest pain, shortness of breath, productive cough, hemoptysis, leg swelling, prior MI, DVT, recent surgeries, recent prolonged travel, urinary symptoms.  HPI  Past Medical History:  Diagnosis Date  . Gestational diabetes    insulin  . Gestational diabetes mellitus, antepartum 02/14/2013  . History of gestational diabetes   . Hypertension    preeclampsia with previous pregnancy  . Scalp cyst     Patient Active Problem List   Diagnosis Date Noted  . Preventative health care 02/20/2016  . Contraceptive management 05/25/2015  . Type 2 diabetes mellitus ([place]) 04/24/2015  . Microalbuminuria due to type 2 diabetes mellitus ([place]) 03/25/2015  . Hyperlipemia 03/09/2015  . Tobacco use disorder 03/09/2015  . Essential hypertension 11/15/2012    Past Surgical History:  Procedure Laterality Date  . CYST EXCISION N/A 09/15/2016   Procedure: EXCISION POSTERIOR SCALP CYST;  Surgeon: Clovis Riley, MD;  Location: [facility] ORS;  Service: General;  Laterality: N/A;  . TUBAL LIGATION     2007    OB History    Gravida Para Term Preterm AB Living   '4 4 3 1 '$ 0 4   SAB TAB Ectopic Multiple Live Births   0 0 0 0 4       Home Medications    Prior to Admission medications   Medication Sig Start Date End Date Taking? Authorizing Provider  aspirin EC 81 MG tablet Take 1 tablet (81 mg  total) by mouth daily. 05/11/17  Yes Luiz Blare Y, DO  atorvastatin (LIPITOR) 40 MG tablet Take 1 tablet (40 mg total) by mouth daily. 09/04/17  Yes Orson Eva J, DO  Empagliflozin-Metformin HCl (SYNJARDY) 04-999 MG TABS Take 1 tablet by mouth 2 (two) times daily. 09/04/17  Yes Bufford Lope, DO  lisinopril-hydrochlorothiazide (ZESTORETIC) 20-12.5 MG tablet Take 1 tablet by mouth daily. 09/04/17  Yes Orson Eva J, DO  ACCU-CHEK FASTCLIX LANCETS MISC 1 each by Does not apply route 4 (four) times daily -  before meals and at bedtime. Check sugar 6 x daily 05/11/17   Katheren Shams, DO  benzonatate (TESSALON) 200 MG capsule Take 1 capsule (200 mg total) by mouth 3 (three) times daily as needed for cough. 11/07/17   Verna Desrocher, PA-C  Blood Glucose Monitoring Suppl (ACCU-CHEK NANO SMARTVIEW) W/DEVICE KIT 1 Device by Does not apply route 4 (four) times daily -  before meals and at bedtime. 03/09/15   Katheren Shams, DO  glucose blood (ACCU-CHEK AVIVA PLUS) test strip 1 each by Other route 2 (two) times daily. Use twice daily to monitor blood sugars. 09/04/17   Bufford Lope, DO  medroxyPROGESTERone (DEPO-PROVERA) 150 MG/ML injection Inject 1 mL (150 mg total) into the muscle every 3 (three) months. Patient not taking: Reported on 11/07/2017 05/10/13   Poe, Deirdre C, CNM  naproxen (NAPROSYN) 500  MG tablet Take 1 tablet (500 mg total) by mouth 2 (two) times daily. 11/07/17   Delia Heady, PA-C    Family History Family History  Problem Relation Age of Onset  . Heart disease Mother   . Hypertension Mother   . Diabetes Mother   . Hypertension Father   . Diabetes Father     Social History Social History   Tobacco Use  . Smoking status: Current Every Day Smoker    Packs/day: 0.50    Years: 22.00    Pack years: 11.00    Types: Cigarettes  . Smokeless tobacco: Never Used  Substance Use Topics  . Alcohol use: No    Alcohol/week: 0.0 oz  . Drug use: No     Allergies   Patient has no known  allergies.   Review of Systems Review of Systems  Constitutional: Negative for chills and fever.  HENT: Positive for rhinorrhea. Negative for congestion, facial swelling, sore throat, trouble swallowing and voice change.   Respiratory: Positive for cough. Negative for shortness of breath.   Cardiovascular: Negative for chest pain and leg swelling.  Gastrointestinal: Negative for nausea and vomiting.  Musculoskeletal: Negative for arthralgias, gait problem, joint swelling, myalgias and neck pain.  Skin: Negative for color change and rash.  Neurological: Negative for weakness and numbness.     Physical Exam Updated Vital Signs BP (!) 142/89 (BP Location: Right Arm)   Pulse 75   Temp 98.9 F (37.2 C) (Oral)   Resp 16   SpO2 100%   Physical Exam  Constitutional: She is oriented to person, place, and time. She appears well-developed and well-nourished. No distress.  HENT:  Head: Normocephalic and atraumatic.  Eyes: Conjunctivae and EOM are normal. No scleral icterus.  Neck: Normal range of motion.  Cardiovascular: Normal rate, regular rhythm and normal heart sounds.  Pulmonary/Chest: Effort normal and breath sounds normal. No respiratory distress. She exhibits bony tenderness.    Neurological: She is alert and oriented to person, place, and time. No cranial nerve deficit or sensory deficit. She exhibits normal muscle tone. Coordination normal.  Pupils reactive. No facial asymmetry noted. Cranial nerves appear grossly intact. Sensation intact to light touch on face, BUE and BLE. Strength 5/5 in BUE and BLE. Normal finger to nose coordination.  Skin: No rash noted. She is not diaphoretic.  Psychiatric: She has a normal mood and affect.  Nursing note and vitals reviewed.    ED Treatments / Results  Labs (all labs ordered are listed, but only abnormal results are displayed) Labs Reviewed - No data to display  EKG  EKG Interpretation None       Radiology Dg Chest 2  View  Result Date: 11/07/2017 CLINICAL DATA:  Cough and left chest wall pain for 3 days. EXAM: CHEST  2 VIEW COMPARISON:  11/17/2015 FINDINGS: The heart size and mediastinal contours are within normal limits. Both lungs are clear. No evidence of pleural effusion or pneumothorax. The visualized skeletal structures are unremarkable. IMPRESSION: No active cardiopulmonary disease. Electronically Signed   By: Earle Gell M.D.   On: 11/07/2017 11:02    Procedures Procedures (including critical care time)  Medications Ordered in ED Medications - No data to display   Initial Impression / Assessment and Plan / ED Course  I have reviewed the triage vital signs and the nursing notes.  Pertinent labs & imaging results that were available during my care of the patient were reviewed by me and considered in my medical decision  making (see chart for details).     Patient presents to ED for evaluation of left rib pain that radiates down her left side and left leg.  She does report recent dry cough and URI symptoms. She does have tenderness to palpation of the left side rib cage indicated in the image above.  She denies any chest pain, shortness of breath, wheezing, fever, hemoptysis, injuries or falls.  She is afebrile.  Lungs are clear to auscultation bilaterally and she is nontoxic-appearing and in no acute distress.  No focal deficits on neurological exam. Chest x-ray was unremarkable and no signs of pneumonia, pulmonary disease or rib injury..  I suspect that her symptoms are due to costochondritis due to her recent viral URI with cough.  I have low suspicion for cardiac or pulmonary cause of her left-sided pain.  We will give her Tessalon Perles and naproxen to be taken for pain and inflammation.  I did advise her to follow-up with her primary care provider for further evaluation.  Patient appears stable for discharge at this time.  Strict return precautions given.  Final Clinical Impressions(s) / ED  Diagnoses   Final diagnoses:  Costochondritis  Viral URI with cough    ED Discharge Orders        Ordered    naproxen (NAPROSYN) 500 MG tablet  2 times daily     11/07/17 1120    benzonatate (TESSALON) 200 MG capsule  3 times daily PRN     11/07/17 1120       Delia Heady, PA-C 11/07/17 1128    Daleen Bo, MD 11/07/17 1510

## 2017-11-20 ENCOUNTER — Ambulatory Visit (INDEPENDENT_AMBULATORY_CARE_PROVIDER_SITE_OTHER): Payer: Medicaid Other | Admitting: *Deleted

## 2017-11-20 DIAGNOSIS — Z3042 Encounter for surveillance of injectable contraceptive: Secondary | ICD-10-CM

## 2017-11-20 MED ORDER — MEDROXYPROGESTERONE ACETATE 150 MG/ML IM SUSY
150.0000 mg | PREFILLED_SYRINGE | Freq: Once | INTRAMUSCULAR | Status: AC
  Administered 2017-11-20: 150 mg via INTRAMUSCULAR

## 2017-11-20 NOTE — Progress Notes (Signed)
Within dates.  Depo given, tolerated well.  To return 02/05/18 - 02/19/18 Darlene Bennett, Darlene Bennett Spotted, [place]

## 2017-12-12 ENCOUNTER — Other Ambulatory Visit: Payer: Self-pay | Admitting: Family Medicine

## 2017-12-12 DIAGNOSIS — E119 Type 2 diabetes mellitus without complications: Secondary | ICD-10-CM

## 2017-12-12 NOTE — Telephone Encounter (Signed)
Needs refill on her syndarance.  She has been out since the end of the month of No.  Her sugars are sunning around 220.  [facility] [address]

## 2017-12-13 ENCOUNTER — Encounter (HOSPITAL_COMMUNITY): Payer: Self-pay | Admitting: *Deleted

## 2017-12-13 ENCOUNTER — Emergency Department (HOSPITAL_COMMUNITY)
Admission: EM | Admit: 2017-12-13 | Discharge: 2017-12-13 | Disposition: A | Discharge status: Home / Self Care | Payer: Medicaid Other | Attending: Emergency Medicine | Admitting: Emergency Medicine

## 2017-12-13 ENCOUNTER — Other Ambulatory Visit: Payer: Self-pay

## 2017-12-13 DIAGNOSIS — Z7984 Long term (current) use of oral hypoglycemic drugs: Secondary | ICD-10-CM | POA: Insufficient documentation

## 2017-12-13 DIAGNOSIS — R739 Hyperglycemia, unspecified: Secondary | ICD-10-CM

## 2017-12-13 DIAGNOSIS — E1165 Type 2 diabetes mellitus with hyperglycemia: Secondary | ICD-10-CM | POA: Diagnosis not present

## 2017-12-13 DIAGNOSIS — Z7982 Long term (current) use of aspirin: Secondary | ICD-10-CM | POA: Diagnosis not present

## 2017-12-13 DIAGNOSIS — I10 Essential (primary) hypertension: Secondary | ICD-10-CM | POA: Insufficient documentation

## 2017-12-13 DIAGNOSIS — Z9114 Patient's other noncompliance with medication regimen: Secondary | ICD-10-CM | POA: Insufficient documentation

## 2017-12-13 DIAGNOSIS — F17210 Nicotine dependence, cigarettes, uncomplicated: Secondary | ICD-10-CM | POA: Insufficient documentation

## 2017-12-13 DIAGNOSIS — M25552 Pain in left hip: Secondary | ICD-10-CM | POA: Diagnosis not present

## 2017-12-13 LAB — CBG MONITORING, ED: Glucose-Capillary: 237 mg/dL — ABNORMAL HIGH (ref 65–99)

## 2017-12-13 MED ORDER — DICLOFENAC SODIUM 1 % TD GEL: 4.0000 g | Freq: Four times a day (QID) | TRANSDERMAL | 0 refills | Status: DC

## 2017-12-13 NOTE — Discharge Instructions (Signed)
Please follow attached handout.   For pain control you may take: Topical Diclofenac as prescribed and acetaminophen 975mg  (this is 3 normal strength, 325mg , over the counter pills) up to four times a day. Please do not take more than this. Do not drink alcohol or combine with other medications that have acetaminophen or Ibuprofen as an ingredient (Read the labels!).   Iliotibial band (ITB) standing stretch  The standing ITB stretch is performed by placing the affected leg behind the unaffected leg, then bringing the legs towards each other by advancing the hind foot so the toes of both feet are nearly aligned ("legs tightly crossed"). From this position, push the hip of the affected leg towards the wall. A stretching sensation should be appreciated over the ITB and lateral thigh of the posterior leg.  Please refill your home medications.  I have attached handout on proper eating.  Please follow with your PCP.

## 2017-12-13 NOTE — ED Triage Notes (Signed)
To ED for eval of left leg pain for past month - seen for same thing within the month without dx. Denies injury. Pain has not changed. No swelling to area of pain. Ambulatory. States she thinks her sugar is high- states have 'been high here lately'. Denies s/s of infection. Denies taking dm meds in past month.

## 2017-12-13 NOTE — ED Provider Notes (Signed)
[facility]ER[facility] DEPARTMENT Provider Note   CSN: [id number] Arrival date & time: 12/13/17  0915     History   Chief Complaint Chief Complaint  Patient presents with  . Leg Pain  . Hyperglycemia    HPI Darlene Bennett is a 42 y.o. female with history of hypertension, hyperlipidemia, diabetes who presents the emergency department today for left leg pain.  Patient states that over the past month she notes that when she is lying on her left side she gets a sharp pain in her left hip that travels into her left upper thigh.  The pain does not radiate into her lower leg.  Pain is worsened by certain movements of the hip.  She has not taken anything for this.  She denies any low back pain, numbness/tingling/weakness of the lower extremity, fever, chills, lower leg swelling, erythema, trauma, recent surgery or travel, immobilization, prior blood clot, family history of bleeding/clotting disorder or estrogen use (she does get Depo-Provera shots).  HPI  Past Medical History:  Diagnosis Date  . Gestational diabetes    insulin  . Gestational diabetes mellitus, antepartum 02/14/2013  . History of gestational diabetes   . Hypertension    preeclampsia with previous pregnancy  . Scalp cyst     Patient Active Problem List   Diagnosis Date Noted  . Preventative health care 02/20/2016  . Contraceptive management 05/25/2015  . Type 2 diabetes mellitus ([place]) 04/24/2015  . Microalbuminuria due to type 2 diabetes mellitus ([place]) 03/25/2015  . Hyperlipemia 03/09/2015  . Tobacco use disorder 03/09/2015  . Essential hypertension 11/15/2012    Past Surgical History:  Procedure Laterality Date  . CYST EXCISION N/A 09/15/2016   Procedure: EXCISION POSTERIOR SCALP CYST;  Surgeon: Clovis Riley, MD;  Location: [facility];  Service: General;  Laterality: N/A;  . TUBAL LIGATION     2007    OB History    Gravida Para Term Preterm AB Living   '4 4 3 1 '$ 0 4   SAB TAB Ectopic Multiple  Live Births   0 0 0 0 4       Home Medications    Prior to Admission medications   Medication Sig Start Date End Date Taking? Authorizing Provider  ACCU-CHEK FASTCLIX LANCETS MISC 1 each by Does not apply route 4 (four) times daily -  before meals and at bedtime. Check sugar 6 x daily 05/11/17   Katheren Shams, DO  aspirin EC 81 MG tablet Take 1 tablet (81 mg total) by mouth daily. 05/11/17   Katheren Shams, DO  atorvastatin (LIPITOR) 40 MG tablet Take 1 tablet (40 mg total) by mouth daily. 09/04/17   Bufford Lope, DO  benzonatate (TESSALON) 200 MG capsule Take 1 capsule (200 mg total) by mouth 3 (three) times daily as needed for cough. 11/07/17   Khatri, Hina, PA-C  Blood Glucose Monitoring Suppl (ACCU-CHEK NANO SMARTVIEW) W/DEVICE KIT 1 Device by Does not apply route 4 (four) times daily -  before meals and at bedtime. 03/09/15   Katheren Shams, DO  Empagliflozin-Metformin HCl (SYNJARDY) 04-999 MG TABS Take 1 tablet by mouth 2 (two) times daily. 09/04/17   Bufford Lope, DO  glucose blood (ACCU-CHEK AVIVA PLUS) test strip 1 each by Other route 2 (two) times daily. Use twice daily to monitor blood sugars. 09/04/17   Bufford Lope, DO  lisinopril-hydrochlorothiazide (ZESTORETIC) 20-12.5 MG tablet Take 1 tablet by mouth daily. 09/04/17   Bufford Lope,  DO  medroxyPROGESTERone (DEPO-PROVERA) 150 MG/ML injection Inject 1 mL (150 mg total) into the muscle every 3 (three) months. Patient not taking: Reported on 11/07/2017 05/10/13   Poe, Deirdre C, CNM  naproxen (NAPROSYN) 500 MG tablet Take 1 tablet (500 mg total) by mouth 2 (two) times daily. 11/07/17   Delia Heady, PA-C    Family History Family History  Problem Relation Age of Onset  . Heart disease Mother   . Hypertension Mother   . Diabetes Mother   . Hypertension Father   . Diabetes Father     Social History Social History   Tobacco Use  . Smoking status: Current Every Day Smoker    Packs/day: 0.50    Years: 22.00    Pack years: 11.00     Types: Cigarettes  . Smokeless tobacco: Never Used  Substance Use Topics  . Alcohol use: No    Alcohol/week: 0.0 oz  . Drug use: No     Allergies   Patient has no known allergies.   Review of Systems Review of Systems  All other systems reviewed and are negative.    Physical Exam Updated Vital Signs BP (!) 172/102   Pulse 80   Temp 98.6 F (37 C) (Oral)   Resp 16   SpO2 100%   Physical Exam  Constitutional: She appears well-developed and well-nourished.  HENT:  Head: Normocephalic and atraumatic.  Right Ear: External ear normal.  Left Ear: External ear normal.  Eyes: Conjunctivae are normal. Right eye exhibits no discharge. Left eye exhibits no discharge. No scleral icterus.  Pulmonary/Chest: Effort normal. No respiratory distress.  Musculoskeletal:  Lumbar spine with normal appearance.  No lumbar spinous tenderness to palpation or step-offs. Left hip without normal appearance. No obvious deformity. No skin swelling, erythema, heat, fluctuance or break of the skin. TTP over left hip joint as well as iliotibial band.  Very minimal tenderness of the quadriceps and hamstrings.  No adductor tenderness.  Intact flexion, extension, internal rotation, external rotation, abduction and adduction.  Patient with normal exam of knee and ankle.  No lower extremity edema or swelling.  Dorsalis pedis and posterior tib pulses 2+.  Cap refill less than 2 seconds.   Neurological: She is alert. She has normal strength.  Normal gait.  No foot drop.  Negative straight leg test bilaterally.  Strength 5/5 for hip, knee and ankle.  Skin: Skin is warm and dry. Capillary refill takes less than 2 seconds. No erythema. No pallor.  Psychiatric: She has a normal mood and affect.  Nursing note and vitals reviewed.    ED Treatments / Results  Labs (all labs ordered are listed, but only abnormal results are displayed) Labs Reviewed  CBG MONITORING, ED - Abnormal; Notable for the following  components:      Result Value   Glucose-Capillary 237 (*)    All other components within normal limits    EKG  EKG Interpretation None       Radiology No results found.  Procedures Procedures (including critical care time)  Medications Ordered in ED Medications - No data to display   Initial Impression / Assessment and Plan / ED Course  I have reviewed the triage vital signs and the nursing notes.  Pertinent labs & imaging results that were available during my care of the patient were reviewed by me and considered in my medical decision making (see chart for details).     42 year old female presenting with left hip pain with radiation into  the thigh.   She does have tenderness over the left hip and iliotibial band.  There is minimal tenderness over the quadriceps and hamstring.  There is no leg swelling or edema.  She is without risk factors for DVT.  I have low suspicion for DVT at this time.  She denies any numbness/tingling/weakness.  Normal neurologic exam.  She is without low back pain.  Low suspicion for sciatica from piriformis or lumbar spine at this time.  Patient denies x-ray and is without trauma.  Do not suspect occult fracture the patient is able to range the joint and full range of motion and walk without difficulty.  Patient is afebrile and without any joint swelling or erythema.  She has normal range of motion.  Do not suspect septic joint.  Suspect likely due to IT band syndrome versus bursitis versus musculoskeletal pain.  Patient's creatinine on lab review has been elevating over the last several months.  Will treat with topical diclofenac, rest, stretches, heat and follow-up with an orthopedist versus PCP.  Patient also notes that she has been out of her diabetic medication for the last 1 month.  Her blood sugar today was 237.  She has refills from her PCP at the pharmacy and has agreed to pick them up.  I will provide handout on diet with diabetes.  Return  precautions discussed.  Patient appears safe for discharge.  Final Clinical Impressions(s) / ED Diagnoses   Final diagnoses:  Left hip pain  Hyperglycemia    ED Discharge Orders        Ordered    diclofenac sodium (VOLTAREN) 1 % GEL  4 times daily     12/13/17 1155       Lorelle Gibbs 12/13/17 1452    Tanna Furry, MD 12/19/17 1649

## 2017-12-13 NOTE — Telephone Encounter (Signed)
Form placed in RN office

## 2017-12-13 NOTE — Telephone Encounter (Signed)
Patient came in to office today stating she has been out of this med for 2 months.  Received fax from [facility] requesting prior authorization of Synjardy. Form placed in MD's box for completion along with Medicaid formulary.  Velora Heckler, RN

## 2017-12-14 NOTE — Telephone Encounter (Signed)
Completed PA info in [place] for [facility]  Status pending.  Will recheck status in 24 hours. Chiniqua Kilcrease, Salome Spotted, CMA

## 2017-12-18 ENCOUNTER — Other Ambulatory Visit: Payer: Self-pay

## 2017-12-18 ENCOUNTER — Ambulatory Visit (INDEPENDENT_AMBULATORY_CARE_PROVIDER_SITE_OTHER): Payer: Medicaid Other | Admitting: Family Medicine

## 2017-12-18 ENCOUNTER — Encounter: Payer: Self-pay | Admitting: Family Medicine

## 2017-12-18 VITALS — BP 128/82 | HR 81 | Temp 98.6°F | Ht 72.0 in | Wt 288.2 lb

## 2017-12-18 DIAGNOSIS — E119 Type 2 diabetes mellitus without complications: Secondary | ICD-10-CM

## 2017-12-18 LAB — POCT GLYCOSYLATED HEMOGLOBIN (HGB A1C): Hemoglobin A1C: 8

## 2017-12-18 MED ORDER — METFORMIN HCL 1000 MG PO TABS: 1000.0000 mg | ORAL_TABLET | Freq: Two times a day (BID) | ORAL | 3 refills | Status: DC

## 2017-12-18 MED ORDER — EMPAGLIFLOZIN 10 MG PO TABS: 10.0000 mg | ORAL_TABLET | Freq: Every day | ORAL | 3 refills | Status: DC

## 2017-12-18 NOTE — Progress Notes (Signed)
   Subjective:    Patient ID: Darlene Bennett , female   DOB: 1976/01/24 , 42 y.o..   MRN: [id number]  HPI  Darlene Bennett is a 42 year old female with past medical history of type 2 diabetes, hyperlipidemia, tobacco abuse, hypertension here a same-day visit for for  Chief Complaint  Patient presents with  . Diabetes   1.  Elevated glucose with known diabetes  Blood Sugar Ranges: 197 this morning. She notes that her fasting glucoses have been in this range since she hasn't been on her medication since November 2018. Normally when she was taking her medications   Polyuria: no   Visual problems: no   Last hemoglobin A1C:  Lab Results  Component Value Date   HGBA1C 8.0 12/18/2017   Medication Compliance: no, has been out since November due to insurance issues  Medication Side Effects  Hypoglycemia: no    Review of Systems: Per HPI.   Past Medical History: Patient Active Problem List   Diagnosis Date Noted  . Preventative health care 02/20/2016  . Contraceptive management 05/25/2015  . Type 2 diabetes mellitus ([place]) 04/24/2015  . Microalbuminuria due to type 2 diabetes mellitus ([place]) 03/25/2015  . Hyperlipemia 03/09/2015  . Tobacco use disorder 03/09/2015  . Essential hypertension 11/15/2012    Medications: reviewed   Social Hx:  reports that she has been smoking cigarettes.  She has a 11.00 pack-year smoking history. she has never used smokeless tobacco.   Objective:   BP 128/82   Pulse 81   Temp 98.6 F (37 C) (Oral)   Ht 6' (1.829 m)   Wt 288 lb 3.2 oz (130.7 kg)   SpO2 99%   BMI 39.09 kg/m  Physical Exam  Gen: NAD, alert, cooperative with exam, well-appearing Resp: Normal work of breathing Psych: good insight, normal mood and affect  Assessment & Plan:  Type 2 diabetes mellitus Previously well controlled on combination Jardiance metformin (synjardy) which she states she has been on since March 2018.  A1c worsening today at 8 today after not being on this  medication for the last 2 months secondary to prior auth that was declined. - Discussed patient's medications with Dr. Valentina Lucks and separating Darlene Bennett and metformin to 2 pills should be covered by Medicaid -Jardiance 10 mg daily -Metformin 1000 mg twice daily -Continue to check fasting glucoses daily -Discussed with patient her glucoses remain uncontrolled while on the above medications to please schedule appointment with PCP, otherwise she will follow-up in 3 months for next A1c check  Orders Placed This Encounter  Procedures  . HgB A1c   Meds ordered this encounter  Medications  . empagliflozin (JARDIANCE) 10 MG TABS tablet    Sig: Take 10 mg by mouth daily.    Dispense:  30 tablet    Refill:  3  . metFORMIN (GLUCOPHAGE) 1000 MG tablet    Sig: Take 1 tablet (1,000 mg total) by mouth 2 (two) times daily with a meal.    Dispense:  180 tablet    Refill:  3    Smitty Cords, MD [place], PGY-3

## 2017-12-18 NOTE — Assessment & Plan Note (Signed)
Previously well controlled on combination Jardiance metformin (synjardy) which she states she has been on since March 2018.  A1c worsening today at 8 today after not being on this medication for the last 2 months secondary to prior auth that was declined. - Discussed patient's medications with Dr. Valentina Lucks and separating Darlene Bennett and metformin to 2 pills should be covered by Medicaid -Jardiance 10 mg daily -Metformin 1000 mg twice daily -Continue to check fasting glucoses daily -Discussed with patient her glucoses remain uncontrolled while on the above medications to please schedule appointment with PCP, otherwise she will follow-up in 3 months for next A1c check

## 2017-12-18 NOTE — Telephone Encounter (Signed)
Synjardy denied.  Pt and provider informed. Provider will receive fax letting her know what the steps for an appeal are.  [place], Salome Spotted, CMA

## 2017-12-18 NOTE — Patient Instructions (Signed)
Thank you for coming in today, it was so nice to see you! Today we talked about:    Diabetes: It looks like the combo pill was not covered by your insurance.  We have sent to the medications separately instead of in the combo pill.  Medicaid should cover this.  If you have any issues please call our clinic.  Your A1c was 8 today, we will need to recheck this in 3 months.  If your sugars remain high (above 100) in the mornings despite taking the new medications he schedule an appointment with Dr. Shawna Orleans  If you have any questions or concerns, please do not hesitate to call the office at [phone]. You can also message me directly via MyChart.   Sincerely,  Smitty Cords, MD

## 2017-12-28 ENCOUNTER — Telehealth: Payer: Self-pay | Admitting: *Deleted

## 2017-12-28 NOTE — Telephone Encounter (Signed)
Received PA from [facility] for pts jardiance.  PA completed according to note from 12/18/17.  Approved 12/28/17 - 12/28/18.  PA# [id number]  [facility] informed. Layla Gramm, Salome Spotted, CMA

## 2018-02-07 ENCOUNTER — Ambulatory Visit (INDEPENDENT_AMBULATORY_CARE_PROVIDER_SITE_OTHER): Payer: Medicaid Other

## 2018-02-07 DIAGNOSIS — Z3042 Encounter for surveillance of injectable contraceptive: Secondary | ICD-10-CM | POA: Diagnosis not present

## 2018-02-07 MED ORDER — MEDROXYPROGESTERONE ACETATE 150 MG/ML IM SUSY
150.0000 mg | PREFILLED_SYRINGE | Freq: Once | INTRAMUSCULAR | Status: AC
  Administered 2018-02-07: 150 mg via INTRAMUSCULAR

## 2018-02-07 NOTE — Progress Notes (Signed)
Pt here for depo injection. Injection given LUOQ. Pt tolerated well. Next depo due May 22-May 09 2018. Wallace Cullens, RN

## 2018-03-19 ENCOUNTER — Other Ambulatory Visit: Payer: Self-pay | Admitting: Family Medicine

## 2018-03-19 DIAGNOSIS — E119 Type 2 diabetes mellitus without complications: Secondary | ICD-10-CM

## 2018-03-21 NOTE — Telephone Encounter (Signed)
appt made.  Flordia Kassem,CMA  

## 2018-03-21 NOTE — Telephone Encounter (Signed)
Will give 30 d refill as patient due for appointment and has not yet scheduled. Can she please be called to schedule

## 2018-03-26 ENCOUNTER — Telehealth: Payer: Self-pay | Admitting: *Deleted

## 2018-03-26 NOTE — Telephone Encounter (Signed)
Received fax from pharmacy stating that they spoke with patient regarding her previous use of a statin but she hasn't filled it in more than 3 months.  Patient would like to see if this is something that would be beneficial to restart.  She has an upcoming appointment in May that this concern can be discussed unless you would like for her to restart the medication sooner. Apolo Cutshaw,CMA

## 2018-03-26 NOTE — Telephone Encounter (Signed)
Can be discussed at her upcoming appointment in May, please inform patient of this. Thanks

## 2018-04-16 ENCOUNTER — Ambulatory Visit: Payer: Medicaid Other | Admitting: Family Medicine

## 2018-04-16 ENCOUNTER — Ambulatory Visit: Payer: Medicaid Other | Admitting: Internal Medicine

## 2018-04-16 ENCOUNTER — Encounter: Payer: Self-pay | Admitting: Internal Medicine

## 2018-04-16 ENCOUNTER — Telehealth: Payer: Self-pay | Admitting: Family Medicine

## 2018-04-16 ENCOUNTER — Other Ambulatory Visit: Payer: Self-pay

## 2018-04-16 VITALS — BP 128/88 | HR 83 | Temp 98.2°F | Ht 72.0 in | Wt 286.0 lb

## 2018-04-16 DIAGNOSIS — Z23 Encounter for immunization: Secondary | ICD-10-CM

## 2018-04-16 DIAGNOSIS — E119 Type 2 diabetes mellitus without complications: Secondary | ICD-10-CM

## 2018-04-16 LAB — POCT GLYCOSYLATED HEMOGLOBIN (HGB A1C): Hemoglobin A1C: 7.1

## 2018-04-16 NOTE — Assessment & Plan Note (Signed)
Improving. A1c decreased from 8.0 to 7.1 today. - Continue Metformin 1000mg  bid and Jardiance 10mg  daily - Continue checking blood sugars 1-2 times daily - Diabetic foot exam performed today and was normal - Pneumonia vaccine given - Referral placed to [facility] for annual diabetic eye exam - Follow-up with PCP in 3 months

## 2018-04-16 NOTE — Telephone Encounter (Signed)
Pt came in office and dropped a [employer] Attestation of Medical Need form to be fill out and sign by MD. Last DOS 04-16-18. [facility] phone # [phone]. Form was placed in blue team folder.

## 2018-04-16 NOTE — Patient Instructions (Signed)
It was so nice to see you!  Your A1c went from 8.0 to 7.1. Please keep taking your Jardiance and Metformin as you are currently doing.   I have sent in a new referral to the eye doctor. You should hear from our office in the next couple of weeks to schedule this appointment.  Please schedule an appointment for your depo and pap.  -Dr. Brett Albino

## 2018-04-16 NOTE — Addendum Note (Signed)
Addended by: Louie Bun on: 04/16/2018 04:30 PM   Modules accepted: Orders

## 2018-04-16 NOTE — Progress Notes (Signed)
   [facility] Phone: [phone]  Subjective:  Darlene Bennett is a 42 year old female presenting to clinic for follow-up of her diabetes. She was last seen 12/28/17. A1c was 8.0 and she had been off of her Jardiance due to insurance issues. Jardiance was restarted at that visit. She has been checking her blood sugars 1-2 times daily. They have been ranging from 98-130. She has not had any side effects from the medications. She endorses polyuria but denies polydipsia. She has also cut out sweets to help with her blood sugar control.   ROS: See HPI for pertinent positives and negatives  Past Medical History- HTN, T2DM, HLD  Family history reviewed for today's visit. No changes.  Social history- patient is a current smoker  Objective: BP 128/88 (BP Location: Left Arm, Patient Position: Sitting, Cuff Size: Large)   Pulse 83   Temp 98.2 F (36.8 C) (Oral)   Ht 6' (1.829 m)   Wt 286 lb (129.7 kg)   SpO2 98%   BMI 38.79 kg/m  Gen: NAD, alert, cooperative with exam HEENT: NCAT, EOMI, MMM Resp: Normal work of breathing Msk: No edema Feet: No deformities, no ulcerations, no other skin breakdown bilaterally; intact to touch and monofilament testing bilaterally; PT and DP pulses intact bilaterally  Assessment/Plan: T2DM: Improving. A1c decreased from 8.0 to 7.1 today. - Continue Metformin 1000mg  bid and Jardiance 10mg  daily - Continue checking blood sugars 1-2 times daily - Diabetic foot exam performed today and was normal - Pneumonia vaccine given - Referral placed to [facility] for annual diabetic eye exam - Follow-up with PCP in 3 months  Return to clinic in the next couple of weeks for pap.   Hyman Bible, MD PGY-3

## 2018-04-17 NOTE — Telephone Encounter (Signed)
Clinical info completed on pcs form.  Place form in Dr. Lora Havens box for completion.  Darlene Bennett, [place]

## 2018-04-18 NOTE — Telephone Encounter (Signed)
No documentation or recollection of any impaired ADLs from last patient visit with me. Called patient to discuss what this request pertains to but no response. Left message to call back.

## 2018-04-19 NOTE — Telephone Encounter (Signed)
Patient informed that there was no documented medical reason as to why she would need personal care services.  Patient voiced understanding and stated that a program she is in asked her to have it filled out.  Patient does agree that she doesn't have a need for these services.  Will pick up the original form at her upcoming pap appointment next week.  Will forward to MD to ask her to place form up front for her to pick up.  Jazmin Hartsell,CMA

## 2018-04-20 NOTE — Telephone Encounter (Signed)
Placed up front for pickup ° °

## 2018-04-23 ENCOUNTER — Encounter: Payer: Self-pay | Admitting: Internal Medicine

## 2018-04-23 ENCOUNTER — Other Ambulatory Visit (HOSPITAL_COMMUNITY)
Admission: RE | Admit: 2018-04-23 | Discharge: 2018-04-23 | Disposition: A | Discharge status: Home / Self Care | Payer: Medicaid Other | Source: Ambulatory Visit | Attending: Family Medicine | Admitting: Family Medicine

## 2018-04-23 ENCOUNTER — Other Ambulatory Visit: Payer: Self-pay

## 2018-04-23 ENCOUNTER — Ambulatory Visit (INDEPENDENT_AMBULATORY_CARE_PROVIDER_SITE_OTHER): Payer: Medicaid Other | Admitting: Internal Medicine

## 2018-04-23 VITALS — BP 118/88 | HR 75 | Temp 99.0°F | Ht 72.0 in | Wt 284.0 lb

## 2018-04-23 DIAGNOSIS — Z124 Encounter for screening for malignant neoplasm of cervix: Secondary | ICD-10-CM | POA: Insufficient documentation

## 2018-04-23 NOTE — Progress Notes (Signed)
Patient received bus pass due to having to come back for her depoprovera injection since the insurance doesn't cover it early.  Lynnsie Linders,CMA

## 2018-04-23 NOTE — Assessment & Plan Note (Signed)
Pap performed today. Will call patient to discuss results. Patient declined STD testing. Unable to get depo today because she is two days early. Return to nursing clinic to have this done in two days.

## 2018-04-23 NOTE — Progress Notes (Signed)
   [facility] Phone: [phone]  Subjective:  Darlene Bennett is a 42 year old female presenting to clinic for a pap smear. She has never had an abnormal pap before. She is not sexually active. She does not want to have STD testing done today. She does not have a period because she is on depo. No vaginal discharge, no vaginal lesions.  ROS: See HPI for pertinent positives and negatives  Past Medical History- HTN, T2DM, HLD  Family history reviewed for today's visit. No changes.  Social history- patient is a current smoker  Objective: BP 118/88   Pulse 75   Temp 99 F (37.2 C) (Oral)   Ht 6' (1.829 m)   Wt 284 lb (128.8 kg)   SpO2 98%   BMI 38.52 kg/m  Gen: NAD, alert, cooperative with exam GU: External genitalia normal in appearance, no vaginal lesions, small amount of white vaginal discharge present, vaginal walls normal, cervix normal in appearance, no CMT, bimanual exam without any masses or tenderness  Assessment/Plan: Encounter for pap: Pap performed today. Will call patient to discuss results. Patient declined STD testing. Unable to get depo today because she is two days early. Return to nursing clinic to have this done in two days.   Hyman Bible, MD PGY-3

## 2018-04-23 NOTE — Patient Instructions (Signed)
It was so nice to see you!  We did your pap smear today. I will call you with your results.  Please schedule a nursing visit after 5/22 to have your depo shot done.  -Dr. Brett Albino

## 2018-04-25 ENCOUNTER — Ambulatory Visit (INDEPENDENT_AMBULATORY_CARE_PROVIDER_SITE_OTHER): Payer: Medicaid Other

## 2018-04-25 DIAGNOSIS — Z3042 Encounter for surveillance of injectable contraceptive: Secondary | ICD-10-CM | POA: Diagnosis not present

## 2018-04-25 LAB — CYTOLOGY - PAP
ADEQUACY: ABSENT
Diagnosis: NEGATIVE
HPV (WINDOPATH): NOT DETECTED

## 2018-04-25 MED ORDER — MEDROXYPROGESTERONE ACETATE 150 MG/ML IM SUSY
150.0000 mg | PREFILLED_SYRINGE | Freq: Once | INTRAMUSCULAR | Status: AC
  Administered 2018-04-25: 150 mg via INTRAMUSCULAR

## 2018-04-25 NOTE — Progress Notes (Signed)
Patient here today for Depo Provera injection.  Depo given today RUOQ.  Site unremarkable & patient tolerated injection.  Next injection due August 7-21.  Reminder card given.   Wallace Cullens, RN

## 2018-07-06 LAB — HM DIABETES EYE EXAM

## 2018-07-10 ENCOUNTER — Encounter (HOSPITAL_COMMUNITY): Payer: Self-pay | Admitting: Emergency Medicine

## 2018-07-10 ENCOUNTER — Emergency Department (HOSPITAL_COMMUNITY): Payer: Medicaid Other

## 2018-07-10 ENCOUNTER — Emergency Department (HOSPITAL_COMMUNITY)
Admission: EM | Admit: 2018-07-10 | Discharge: 2018-07-10 | Disposition: A | Discharge status: Home / Self Care | Payer: Medicaid Other | Attending: Emergency Medicine | Admitting: Emergency Medicine

## 2018-07-10 DIAGNOSIS — I10 Essential (primary) hypertension: Secondary | ICD-10-CM | POA: Insufficient documentation

## 2018-07-10 DIAGNOSIS — Z7984 Long term (current) use of oral hypoglycemic drugs: Secondary | ICD-10-CM | POA: Diagnosis not present

## 2018-07-10 DIAGNOSIS — F17210 Nicotine dependence, cigarettes, uncomplicated: Secondary | ICD-10-CM | POA: Diagnosis not present

## 2018-07-10 DIAGNOSIS — Z7982 Long term (current) use of aspirin: Secondary | ICD-10-CM | POA: Diagnosis not present

## 2018-07-10 DIAGNOSIS — M5431 Sciatica, right side: Secondary | ICD-10-CM | POA: Insufficient documentation

## 2018-07-10 DIAGNOSIS — M545 Low back pain: Secondary | ICD-10-CM | POA: Diagnosis present

## 2018-07-10 MED ORDER — TRAMADOL HCL 50 MG PO TABS: 50.0000 mg | ORAL_TABLET | Freq: Four times a day (QID) | ORAL | 0 refills | Status: DC | PRN

## 2018-07-10 NOTE — ED Triage Notes (Signed)
Pt. Stated, I ve had rt hip and leg pain for 2-3 weeks.

## 2018-07-10 NOTE — Discharge Instructions (Addendum)
Please read attached information. If you experience any new or worsening signs or symptoms please return to the emergency room for evaluation. Please follow-up with your primary care provider or specialist as discussed. Please use medication prescribed only as directed and discontinue taking if you have any concerning signs or symptoms.   °

## 2018-07-10 NOTE — ED Provider Notes (Signed)
Patient placed in Quick Look pathway, seen and evaluated   Chief Complaint: Right-sided low back/leg pain  HPI: Patient presents with complaint of gradual onset, intermittent right-sided low back/hip/lower extremity pain.  She was seen and evaluated for similar symptoms in January but states they have not improved.  Denies recent trauma or falls.  Pain radiates down the lateral aspect of the right lower extremity.  Denies numbness, weakness, bowel or bladder incontinence, saddle anesthesia, fevers, or history of IV drug use.  No urinary symptoms.  Pain worsens with position changes specifically from bending to standing.   ROS: Positive for back pain, arthralgias Negative for fevers, numbness, weakness  Physical Exam:   Gen: No distress  Neuro: Awake and Alert  Skin: Warm    Focused Exam: No midline lumbar spine tenderness, right-sided paralumbar muscle tenderness noted.  Right SI joint tenderness noted.  Mild right GT bursa/IT band tenderness.  5/5 strength of BLE major muscle groups.  Ambulance with an antalgic gait, but able to Heel Walk and Toe Walk without difficulty.  2+ DP/PT pulses bilaterally, Homans sign absent bilaterally.   Initiation of care has begun. The patient has been counseled on the process, plan, and necessity for staying for the completion/evaluation, and the remainder of the medical screening examination    Renita Papa, PA-C 07/10/18 1342    Lajean Saver, MD 07/10/18 1429

## 2018-07-10 NOTE — ED Notes (Signed)
ED Provider at bedside. 

## 2018-07-10 NOTE — ED Provider Notes (Signed)
[facility] EMERGENCY DEPARTMENT Provider Note   CSN: [id number] Arrival date & time: 07/10/18  1313     History   Chief Complaint Chief Complaint  Patient presents with  . Leg Pain  . Hip Pain    HPI Darlene Bennett is a 42 y.o. female.  HPI   42 year old female presents today with 2 week history of right lower back and hip pain. Patient and she had symptoms similar to this back in January but they resolved on their own. Patient notes that over the last 2 weeks she's had worsening symptoms, sharp shooting pain down the posterior lateral aspect of her leg. She denies any loss of distal sensation strength or motor function. She denies any abdominal pain, fever chills or trauma. Patient reports she has not taken any medications at home for this.  Past Medical History:  Diagnosis Date  . Gestational diabetes    insulin  . Gestational diabetes mellitus, antepartum 02/14/2013  . History of gestational diabetes   . Hypertension    preeclampsia with previous pregnancy  . Scalp cyst     Patient Active Problem List   Diagnosis Date Noted  . Routine cervical smear 04/23/2018  . Preventative health care 02/20/2016  . Contraceptive management 05/25/2015  . Type 2 diabetes mellitus ([place]) 04/24/2015  . Microalbuminuria due to type 2 diabetes mellitus ([place]) 03/25/2015  . Hyperlipemia 03/09/2015  . Tobacco use disorder 03/09/2015  . Essential hypertension 11/15/2012    Past Surgical History:  Procedure Laterality Date  . CYST EXCISION N/A 09/15/2016   Procedure: EXCISION POSTERIOR SCALP CYST;  Surgeon: Clovis Riley, MD;  Location: [facility];  Service: General;  Laterality: N/A;  . TUBAL LIGATION     2007     OB History    Gravida  4   Para  4   Term  3   Preterm  1   AB  0   Living  4     SAB  0   TAB  0   Ectopic  0   Multiple  0   Live Births  4            Home Medications    Prior to Admission medications   Medication Sig Start  Date End Date Taking? Authorizing Provider  ACCU-CHEK FASTCLIX LANCETS MISC 1 each by Does not apply route 4 (four) times daily -  before meals and at bedtime. Check sugar 6 x daily 05/11/17   Katheren Shams, DO  aspirin EC 81 MG tablet Take 1 tablet (81 mg total) by mouth daily. 05/11/17   Katheren Shams, DO  atorvastatin (LIPITOR) 40 MG tablet Take 1 tablet (40 mg total) by mouth daily. 09/04/17   Bufford Lope, DO  benzonatate (TESSALON) 200 MG capsule Take 1 capsule (200 mg total) by mouth 3 (three) times daily as needed for cough. 11/07/17   Khatri, Hina, PA-C  Blood Glucose Monitoring Suppl (ACCU-CHEK NANO SMARTVIEW) W/DEVICE KIT 1 Device by Does not apply route 4 (four) times daily -  before meals and at bedtime. 03/09/15   Katheren Shams, DO  diclofenac sodium (VOLTAREN) 1 % GEL Apply 4 g topically 4 (four) times daily. 12/13/17   Maczis, Barth Kirks, PA-C  empagliflozin (JARDIANCE) 10 MG TABS tablet Take 10 mg by mouth daily. 12/18/17   Carlyle Dolly, MD  glucose blood (ACCU-CHEK AVIVA PLUS) test strip 1 each by Other route 2 (two) times daily. Use twice daily  to monitor blood sugars. 09/04/17   Bufford Lope, DO  lisinopril-hydrochlorothiazide (ZESTORETIC) 20-12.5 MG tablet Take 1 tablet by mouth daily. 09/04/17   Bufford Lope, DO  medroxyPROGESTERone (DEPO-PROVERA) 150 MG/ML injection Inject 1 mL (150 mg total) into the muscle every 3 (three) months. Patient not taking: Reported on 11/07/2017 05/10/13   Poe, Deirdre C, CNM  metFORMIN (GLUCOPHAGE) 1000 MG tablet Take 1 tablet (1,000 mg total) by mouth 2 (two) times daily with a meal. 12/18/17   Carlyle Dolly, MD  naproxen (NAPROSYN) 500 MG tablet Take 1 tablet (500 mg total) by mouth 2 (two) times daily. 11/07/17   Khatri, Hina, PA-C  SYNJARDY 04-999 MG TABS TAKE 1 TABLET BY MOUTH TWICE A DAY 03/21/18   Bufford Lope, DO  traMADol (ULTRAM) 50 MG tablet Take 1 tablet (50 mg total) by mouth every 6 (six) hours as needed. 07/10/18   Okey Regal,  PA-C    Family History Family History  Problem Relation Age of Onset  . Heart disease Mother   . Hypertension Mother   . Diabetes Mother   . Hypertension Father   . Diabetes Father     Social History Social History   Tobacco Use  . Smoking status: Current Every Day Smoker    Packs/day: 0.50    Years: 22.00    Pack years: 11.00    Types: Cigarettes  . Smokeless tobacco: Never Used  Substance Use Topics  . Alcohol use: No    Alcohol/week: 0.0 oz  . Drug use: No     Allergies   Patient has no known allergies.   Review of Systems Review of Systems  All other systems reviewed and are negative.    Physical Exam Updated Vital Signs BP (!) 144/101 (BP Location: Right Arm)   Pulse 76   Temp 99 F (37.2 C) (Oral)   Resp 17   Ht 6' (1.829 m)   Wt 127 kg (280 lb)   SpO2 100%   BMI 37.97 kg/m   Physical Exam  Constitutional: She is oriented to person, place, and time. She appears well-developed and well-nourished.  HENT:  Head: Normocephalic and atraumatic.  Eyes: Pupils are equal, round, and reactive to light. Conjunctivae are normal. Right eye exhibits no discharge. Left eye exhibits no discharge. No scleral icterus.  Neck: Normal range of motion. No JVD present. No tracheal deviation present.  Pulmonary/Chest: Effort normal. No stridor.  Musculoskeletal:  No CT or L-spine tenderness, minor tenderness palpation of the right lateral lumbar musculature, tenderness palpation of lateral hip straight leg positive sensation intact motor function intact  Neurological: She is alert and oriented to person, place, and time. Coordination normal.  Psychiatric: She has a normal mood and affect. Her behavior is normal. Judgment and thought content normal.  Nursing note and vitals reviewed.    ED Treatments / Results  Labs (all labs ordered are listed, but only abnormal results are displayed) Labs Reviewed - No data to display  EKG None  Radiology Dg Lumbar Spine  Complete  Result Date: 07/10/2018 CLINICAL DATA:  Low back and right hip pain with no known injury. The symptoms are chronic but have worsened over the past 2-3 weeks. EXAM: LUMBAR SPINE - COMPLETE 4+ VIEW COMPARISON:  None in PACs FINDINGS: The lumbar vertebral bodies are preserved in height. The disc space heights are well maintained with exception of minimal narrowing at the L4-5 level. There is no spondylolisthesis. There is no significant facet joint hypertrophy.  The pedicles and transverse processes are intact. IMPRESSION: There is no acute or significant chronic bony abnormality of the lumbar spine. There is minimal narrowing of the L4-5 disc space. Electronically Signed   By: David  Martinique M.D.   On: 07/10/2018 14:31   Dg Hip Unilat W Or Wo Pelvis 2-3 Views Right  Result Date: 07/10/2018 CLINICAL DATA:  Posterior right hip pain as well as low back pain with extension of discomfort into the entire right leg. Symptoms are chronic but have increased in severity over the past 2-3 weeks. EXAM: DG HIP (WITH OR WITHOUT PELVIS) 2-3V RIGHT COMPARISON:  None in PACs FINDINGS: The bony pelvis is subjectively adequately mineralized. AP and lateral views of the right hip reveal preservation of the joint space. The articular surfaces of the femoral head and acetabulum remains smoothly rounded. The femoral neck, intertrochanteric, and subtrochanteric regions are normal. IMPRESSION: There is no acute or significant chronic bony abnormality of the right hip. Electronically Signed   By: David  Martinique M.D.   On: 07/10/2018 14:33    Procedures Procedures (including critical care time)  Medications Ordered in ED Medications - No data to display   Initial Impression / Assessment and Plan / ED Course  I have reviewed the triage vital signs and the nursing notes.  Pertinent labs & imaging results that were available during my care of the patient were reviewed by me and considered in my medical decision making (see  chart for details).     42 year old female presents today with likely sciatica. Patient with no red flags here, discharged with outpatient follow-up and strict return precautions. She verbalized understanding and agreement to today's plan had no further questions or concerns at the time discharge.    Final Clinical Impressions(s) / ED Diagnoses   Final diagnoses:  Sciatica of right side    ED Discharge Orders        Ordered    traMADol (ULTRAM) 50 MG tablet  Every 6 hours PRN     07/10/18 1450       Okey Regal, PA-C 07/10/18 1451    Maudie Flakes, MD 07/10/18 2148

## 2018-07-13 ENCOUNTER — Encounter: Payer: Self-pay | Admitting: Family Medicine

## 2018-07-20 ENCOUNTER — Ambulatory Visit (INDEPENDENT_AMBULATORY_CARE_PROVIDER_SITE_OTHER): Payer: Medicaid Other

## 2018-07-20 DIAGNOSIS — Z3042 Encounter for surveillance of injectable contraceptive: Secondary | ICD-10-CM

## 2018-07-20 MED ORDER — MEDROXYPROGESTERONE ACETATE 150 MG/ML IM SUSY
150.0000 mg | PREFILLED_SYRINGE | Freq: Once | INTRAMUSCULAR | Status: AC
  Administered 2018-07-20: 150 mg via INTRAMUSCULAR

## 2018-07-20 NOTE — Progress Notes (Signed)
   Patient in to nurse clinic within dates for Depo-Provera injection. Injection given LUOQ. Next injection due 10/05/18-10/19/18. Reminder card given. Danley Danker, RN [facility] RN)

## 2018-07-24 ENCOUNTER — Telehealth: Payer: Self-pay | Admitting: Family Medicine

## 2018-07-24 DIAGNOSIS — M5431 Sciatica, right side: Secondary | ICD-10-CM

## 2018-07-24 NOTE — Telephone Encounter (Signed)
Request for personal care services form dropped off for at front desk for completion.  Verified that patient section of form has been completed.  Last DOS/WCC with PCP was 04/23/18.  Placed form in team folder to be completed by clinical staff.  Crista Luria

## 2018-07-24 NOTE — Telephone Encounter (Signed)
Clinical info completed on PCS form.  Place form in Dr. Lora Havens box for completion.  Darlene Bennett, [place]

## 2018-07-25 NOTE — Telephone Encounter (Signed)
Call patient to discuss request for personal care services.  She was first diagnosed with right sciatic nerve pain on 07/20/2018 and has not done any home exercises or physical therapy or trying to manage his pain in any way.  She says that Arnold is her counselor and someone that runs errands for her.  They were hoping that since patient's sciatic nerve pain prevents her from getting up out of bed sometimes that she would be able to get some help.  Discussed that based on the acuity of this pain and lack of appropriate medical treatment that this request for personal care services is not appropriate.  Patient willing to go to physical therapy and plans to make an appointment to be evaluated by me in the near future

## 2018-07-30 NOTE — Telephone Encounter (Signed)
Crystal, pts counselor, called about PCS form and whether it had been completed. Please advise.

## 2018-07-31 NOTE — Telephone Encounter (Signed)
Patient is already aware that she needs to try PT before this will be considered.  Tayler Lassen,CMA

## 2018-08-13 ENCOUNTER — Ambulatory Visit: Payer: Medicaid Other | Attending: Family Medicine | Admitting: Physical Therapy

## 2018-10-10 ENCOUNTER — Ambulatory Visit (INDEPENDENT_AMBULATORY_CARE_PROVIDER_SITE_OTHER): Payer: Medicaid Other | Admitting: *Deleted

## 2018-10-10 DIAGNOSIS — Z3042 Encounter for surveillance of injectable contraceptive: Secondary | ICD-10-CM

## 2018-10-10 MED ORDER — MEDROXYPROGESTERONE ACETATE 150 MG/ML IM SUSY
150.0000 mg | PREFILLED_SYRINGE | Freq: Once | INTRAMUSCULAR | Status: AC
  Administered 2018-10-10: 150 mg via INTRAMUSCULAR

## 2018-10-10 NOTE — Progress Notes (Signed)
Patient here today for Depo Provera injection.  Depo given today RUOQ.  Site unremarkable & patient tolerated injection.  Next injection due 12/26/18-01/09/19.  Reminder card given.

## 2018-10-31 ENCOUNTER — Encounter: Payer: Self-pay | Admitting: Family Medicine

## 2018-10-31 ENCOUNTER — Other Ambulatory Visit: Payer: Self-pay

## 2018-10-31 ENCOUNTER — Ambulatory Visit (INDEPENDENT_AMBULATORY_CARE_PROVIDER_SITE_OTHER): Payer: Medicaid Other | Admitting: Family Medicine

## 2018-10-31 VITALS — BP 120/85 | HR 94 | Temp 98.5°F | Wt 268.0 lb

## 2018-10-31 DIAGNOSIS — E785 Hyperlipidemia, unspecified: Secondary | ICD-10-CM | POA: Diagnosis not present

## 2018-10-31 DIAGNOSIS — M5431 Sciatica, right side: Secondary | ICD-10-CM

## 2018-10-31 DIAGNOSIS — M549 Dorsalgia, unspecified: Secondary | ICD-10-CM

## 2018-10-31 DIAGNOSIS — I10 Essential (primary) hypertension: Secondary | ICD-10-CM | POA: Diagnosis not present

## 2018-10-31 DIAGNOSIS — G8929 Other chronic pain: Secondary | ICD-10-CM | POA: Insufficient documentation

## 2018-10-31 DIAGNOSIS — E119 Type 2 diabetes mellitus without complications: Secondary | ICD-10-CM | POA: Diagnosis present

## 2018-10-31 LAB — POCT GLYCOSYLATED HEMOGLOBIN (HGB A1C): HbA1c, POC (controlled diabetic range): 6.5 % (ref 0.0–7.0)

## 2018-10-31 MED ORDER — EMPAGLIFLOZIN-METFORMIN HCL 5-1000 MG PO TABS: 1.0000 | ORAL_TABLET | Freq: Two times a day (BID) | ORAL | 3 refills | Status: DC

## 2018-10-31 NOTE — Assessment & Plan Note (Signed)
Controlled and at goal. Continue lisinopril-HCTZ 20-12.5mg  daily. Check BMP today.

## 2018-10-31 NOTE — Patient Instructions (Signed)
It was good to see you today!  Excellent work on your diabetes control.  I will fill out this form and let you know when it is ready for pick up at the front desk.  Please check-out at the front desk before leaving the clinic. Make an appointment in  3 months for follow up.  We are checking some labs today. If results require attention, either myself or my nurse will get in touch with you. If everything is normal, you will get a letter in the mail or a message in My Chart. Please give [place] a call if you do not hear from [place] after 2 weeks.  Please bring all of your medications with you to each visit.   Sign up for My Chart to have easy access to your labs results, and communication with your primary care physician.  Feel free to call with any questions or concerns at any time, at [phone].   Take care,  Dr. Bufford Lope, [place]

## 2018-10-31 NOTE — Assessment & Plan Note (Signed)
Continue atorvastatin 40mg qd 

## 2018-10-31 NOTE — Assessment & Plan Note (Addendum)
Controlled and doing well with a1c at goal. Continue synjardy 5-1000mg  BID.

## 2018-10-31 NOTE — Progress Notes (Signed)
    Subjective:  Darlene Bennett is a 42 y.o. female who presents to the [facility] today for diabetes follow up  HPI:  Diabetes Checks sugars regularly 110-130s. Taking synjardy 04-999 BID. Compliance- good and tolerating well  Hypoglycemic symptoms- none   Polyuria- no New Visual problems- no   Hypertension Medications: takes lisinopril HCTZ 20-125 daily Compliance- good and tolerating well Lightheadedness- no   Edema- no  Chest pain- no      Dyspnea- no  Hyperlipidemia Disease Monitoring  See symptoms for Hypertension  Medications: atorvastatin 40mg  qd Compliance- good and tolerating well  RUQ pain- no  Muscle aches- no    She has a form for PCS that she needs filled out today. She needs it in the next few weeks. It is for someone to help her out at home when her chronic back pain with R sciatica is flared up to the point she has difficulty functioning. She does have some days that are better than other and is not interested in seeing an orthopedic surgeon at this time because she is able to manage with PCS. No bowel or bladder incontinence. No numbness or weakness. She has never been to see physical therapy or been evaluated for this. She used to take tramadol which didn't help. She mostly takes tylenol as needed.  ROS: Per HPI  Social Hx: She reports that she has been smoking cigarettes. She has a 11.00 pack-year smoking history. She has never used smokeless tobacco. She reports that she does not drink alcohol or use drugs.  Objective:  Physical Exam: BP 120/85   Pulse 94   Temp 98.5 F (36.9 C) (Oral)   Wt 268 lb (121.6 kg)   SpO2 97%   BMI 36.35 kg/m   Gen: NAD, resting comfortably CV: RRR with no murmurs appreciated Pulm: NWOB, CTAB with no crackles, wheezes, or rhonchi GI: Normal bowel sounds present. Soft, Nontender, Nondistended. MSK: no edema, cyanosis, or clubbing noted Skin: warm, dry Neuro: grossly normal, moves all extremities Psych: Normal affect and thought  content  Results for orders placed or performed in visit on 10/31/18 (from the past 72 hour(s))  HgB A1c     Status: None   Collection Time: 10/31/18  2:30 PM  Result Value Ref Range   Hemoglobin A1C     HbA1c POC (<> result, manual entry)     HbA1c, POC (prediabetic range)     HbA1c, POC (controlled diabetic range) 6.5 0.0 - 7.0 %     Assessment/Plan:  Type 2 diabetes mellitus Controlled and doing well with a1c at goal. Continue synjardy 5-1000mg  BID.  Essential hypertension Controlled and at goal. Continue lisinopril-HCTZ 20-12.5mg  daily. Check BMP today.  Hyperlipemia Continue atorvastatin 40mg  qd.   Right sciatic nerve pain Patient has chronic low back pain and R sciatic nerve pain that per chart review was diagnosed in an ED visit. She has never been formally evaluated for this and by history manages well with medications. She has never seen physical therapy. She has no interest in seeing an orthopedic surgeon at this time because she is able to manage her pain and perform most of her ADLs. Given the situation, called patient and discussed that could not fill out PCS form without formal evaluation regarding ADLs. Referral to PT placed again today. Placed form up front for patient pick up.   Bufford Lope, DO PGY-3, [facility] 10/31/2018 2:33 PM

## 2018-10-31 NOTE — Assessment & Plan Note (Signed)
Patient has chronic low back pain and R sciatic nerve pain that per chart review was diagnosed in an ED visit. She has never been formally evaluated for this and by history manages well with medications. She has never seen physical therapy. She has no interest in seeing an orthopedic surgeon at this time because she is able to manage her pain and perform most of her ADLs. Given the situation, called patient and discussed that could not fill out PCS form without formal evaluation regarding ADLs. Referral to PT placed again today. Placed form up front for patient pick up.

## 2018-11-01 LAB — BASIC METABOLIC PANEL
BUN/Creatinine Ratio: 9 (ref 9–23)
BUN: 10 mg/dL (ref 6–24)
CALCIUM: 10 mg/dL (ref 8.7–10.2)
CO2: 18 mmol/L — AB (ref 20–29)
Chloride: 100 mmol/L (ref 96–106)
Creatinine, Ser: 1.08 mg/dL — ABNORMAL HIGH (ref 0.57–1.00)
GFR calc non Af Amer: 63 mL/min/{1.73_m2} (ref >59)
GFR, EST AFRICAN AMERICAN: 73 mL/min/{1.73_m2} (ref >59)
Glucose: 104 mg/dL — ABNORMAL HIGH (ref 65–99)
POTASSIUM: 4 mmol/L (ref 3.5–5.2)
Sodium: 138 mmol/L (ref 134–144)

## 2018-11-05 ENCOUNTER — Encounter: Payer: Self-pay | Admitting: Family Medicine

## 2018-11-07 ENCOUNTER — Ambulatory Visit: Payer: Medicaid Other | Attending: Family Medicine | Admitting: Physical Therapy

## 2018-12-26 ENCOUNTER — Ambulatory Visit (INDEPENDENT_AMBULATORY_CARE_PROVIDER_SITE_OTHER): Payer: Medicaid Other

## 2018-12-26 DIAGNOSIS — Z3042 Encounter for surveillance of injectable contraceptive: Secondary | ICD-10-CM

## 2018-12-26 MED ORDER — MEDROXYPROGESTERONE ACETATE 150 MG/ML IM SUSY
150.0000 mg | PREFILLED_SYRINGE | Freq: Once | INTRAMUSCULAR | Status: AC
  Administered 2018-12-26: 150 mg via INTRAMUSCULAR

## 2018-12-26 NOTE — Progress Notes (Signed)
   Patient in to nurse clinic within dates for Depo-Provera injection. Injection given LUOQ. Next injection due 03/14/19-03/28/19. Reminder card given. Danley Danker, RN [facility] RN)

## 2019-01-18 ENCOUNTER — Other Ambulatory Visit: Payer: Self-pay | Admitting: Family Medicine

## 2019-01-18 DIAGNOSIS — E119 Type 2 diabetes mellitus without complications: Secondary | ICD-10-CM

## 2019-01-21 ENCOUNTER — Other Ambulatory Visit: Payer: Self-pay

## 2019-01-21 DIAGNOSIS — E119 Type 2 diabetes mellitus without complications: Secondary | ICD-10-CM

## 2019-01-21 MED ORDER — GLUCOSE BLOOD VI STRP: ORAL_STRIP | 5 refills | Status: DC

## 2019-01-28 ENCOUNTER — Ambulatory Visit: Payer: Medicaid Other | Admitting: Family Medicine

## 2019-02-14 ENCOUNTER — Ambulatory Visit: Payer: Medicaid Other | Admitting: Family Medicine

## 2019-04-04 ENCOUNTER — Ambulatory Visit: Payer: Medicaid Other

## 2019-04-05 ENCOUNTER — Other Ambulatory Visit: Payer: Self-pay

## 2019-04-05 ENCOUNTER — Ambulatory Visit (INDEPENDENT_AMBULATORY_CARE_PROVIDER_SITE_OTHER): Payer: Medicaid Other

## 2019-04-05 ENCOUNTER — Ambulatory Visit: Payer: Medicaid Other | Admitting: Family Medicine

## 2019-04-05 DIAGNOSIS — Z3042 Encounter for surveillance of injectable contraceptive: Secondary | ICD-10-CM | POA: Diagnosis not present

## 2019-04-05 LAB — POCT URINE PREGNANCY: Preg Test, Ur: NEGATIVE

## 2019-04-05 MED ORDER — MEDROXYPROGESTERONE ACETATE 150 MG/ML IM SUSP
150.0000 mg | Freq: Once | INTRAMUSCULAR | Status: AC
  Administered 2019-04-05: 150 mg via INTRAMUSCULAR

## 2019-04-05 NOTE — Progress Notes (Signed)
Pt presents in nurse clinic for depo provera injection. Pt is not within dates. A urine pregnancy was obtained and negative, pt has not been sexually active recently. Injection given RUOQ, site unremarkable. Pt advised from sexual activity for 1 week. I noticed pt is overdue for diabetes management. Pt has been scheduled with PCP for next week. Pt is due back for next depo injection, 7/17-31, reminder card given.

## 2019-04-11 ENCOUNTER — Ambulatory Visit: Payer: Medicaid Other | Admitting: Family Medicine

## 2019-06-20 ENCOUNTER — Ambulatory Visit: Payer: Medicaid Other

## 2019-06-20 ENCOUNTER — Ambulatory Visit (INDEPENDENT_AMBULATORY_CARE_PROVIDER_SITE_OTHER): Payer: Medicaid Other | Admitting: Family Medicine

## 2019-06-20 ENCOUNTER — Other Ambulatory Visit: Payer: Self-pay

## 2019-06-20 ENCOUNTER — Encounter: Payer: Self-pay | Admitting: Family Medicine

## 2019-06-20 VITALS — BP 138/86 | HR 81

## 2019-06-20 DIAGNOSIS — E119 Type 2 diabetes mellitus without complications: Secondary | ICD-10-CM | POA: Diagnosis not present

## 2019-06-20 DIAGNOSIS — I10 Essential (primary) hypertension: Secondary | ICD-10-CM

## 2019-06-20 DIAGNOSIS — Z113 Encounter for screening for infections with a predominantly sexual mode of transmission: Secondary | ICD-10-CM | POA: Diagnosis not present

## 2019-06-20 DIAGNOSIS — E785 Hyperlipidemia, unspecified: Secondary | ICD-10-CM | POA: Diagnosis not present

## 2019-06-20 DIAGNOSIS — Z3009 Encounter for other general counseling and advice on contraception: Secondary | ICD-10-CM | POA: Diagnosis not present

## 2019-06-20 LAB — POCT GLYCOSYLATED HEMOGLOBIN (HGB A1C): HbA1c, POC (controlled diabetic range): 6.4 % (ref 0.0–7.0)

## 2019-06-20 MED ORDER — ATORVASTATIN CALCIUM 40 MG PO TABS: 40.0000 mg | ORAL_TABLET | Freq: Every day | ORAL | 3 refills | Status: DC

## 2019-06-20 MED ORDER — LISINOPRIL-HYDROCHLOROTHIAZIDE 20-12.5 MG PO TABS: 1.0000 | ORAL_TABLET | Freq: Every day | ORAL | 1 refills | Status: DC

## 2019-06-20 MED ORDER — ASPIRIN EC 81 MG PO TBEC: 81.0000 mg | DELAYED_RELEASE_TABLET | Freq: Every day | ORAL | 3 refills | Status: DC

## 2019-06-20 MED ORDER — SYNJARDY 5-1000 MG PO TABS: 1.0000 | ORAL_TABLET | Freq: Two times a day (BID) | ORAL | 3 refills | Status: DC

## 2019-06-20 NOTE — Progress Notes (Signed)
  Patient Name: Angelly Spearing Date of Birth: 28-Mar-1976 Date of Visit: 06/22/19 PCP: Lattie Haw, MD  Chief Complaint:   Subjective: Allina Riches is a pleasant 43 y.o. with medical history significant for DM Type 2, HTN, HLD presenting today for A1C check, medication refill and Depo Shot.  She reports no headaches or visual changes. She feels that she has her blood glucose under control and is feeling well.  Her sugars range in the mid 100's and she is managing her diabetes with oral medication.  She has no other complaints with regards to her Diabetes or HTN today.  She did mention a rash on her forearms that had developed over the past few months that seems to disappear after she showers. She denies any itchiness, pain or swelling.  ROS: as above.   I have reviewed the patient's medical, surgical, family, and social history as appropriate.  Vitals:   06/20/19 1553  BP: 138/86  Pulse: 81  SpO2: 99%   Exam [facility], no murmurs apprecitated RESP- CTAB ABD- obese, soft, non tender, BS present Skin- small white flattened spots noted on bilateral forearms   Shaquala was seen today for diabetes.  Diagnoses and all orders for this visit:  Type 2 diabetes mellitus without complication, without long-term current use of insulin (HCC) -     HgB A1c -     Basic Metabolic Panel -     Lipid Panel -     Empagliflozin-metFORMIN HCl (SYNJARDY) 04-999 MG TABS; Take 1 tablet by mouth 2 (two) times daily.  Birth control counseling -     Cancel: POCT urine pregnancy  Routine screening for STI (sexually transmitted infection) -     Hepatitis c antibody (reflex)  Hyperlipidemia, unspecified hyperlipidemia type -     atorvastatin (LIPITOR) 40 MG tablet; Take 1 tablet (40 mg total) by mouth daily.  Essential hypertension -     lisinopril-hydrochlorothiazide (ZESTORETIC) 20-12.5 MG tablet; Take 1 tablet by mouth daily.  Other orders -     aspirin EC 81 MG tablet; Take 1 tablet (81 mg total) by  mouth daily. -     HCV Comment:    Return to care in 3 months.   Carollee Leitz, MD  Family Medicine Teaching Service

## 2019-06-20 NOTE — Patient Instructions (Signed)
It was great to meet you today.    I have reordered your medication.  You do not need to check your sugar.  Follow up in three months with your PCP  Carollee Leitz MD

## 2019-06-21 ENCOUNTER — Encounter: Payer: Self-pay | Admitting: Family Medicine

## 2019-06-21 LAB — LIPID PANEL
Chol/HDL Ratio: 5.7 ratio — ABNORMAL HIGH (ref 0.0–4.4)
Cholesterol, Total: 228 mg/dL — ABNORMAL HIGH (ref 100–199)
HDL: 40 mg/dL (ref >39)
LDL Calculated: 163 mg/dL — ABNORMAL HIGH (ref 0–99)
Triglycerides: 127 mg/dL (ref 0–149)
VLDL Cholesterol Cal: 25 mg/dL (ref 5–40)

## 2019-06-21 LAB — HEPATITIS C ANTIBODY (REFLEX): HCV Ab: <0.1 s/co ratio (ref 0.0–0.9)

## 2019-06-21 LAB — BASIC METABOLIC PANEL
BUN/Creatinine Ratio: 12 (ref 9–23)
BUN: 12 mg/dL (ref 6–24)
CO2: 19 mmol/L — ABNORMAL LOW (ref 20–29)
Calcium: 9.3 mg/dL (ref 8.7–10.2)
Chloride: 108 mmol/L — ABNORMAL HIGH (ref 96–106)
Creatinine, Ser: 1.04 mg/dL — ABNORMAL HIGH (ref 0.57–1.00)
GFR calc Af Amer: 76 mL/min/{1.73_m2} (ref >59)
GFR calc non Af Amer: 66 mL/min/{1.73_m2} (ref >59)
Glucose: 86 mg/dL (ref 65–99)
Potassium: 3.9 mmol/L (ref 3.5–5.2)
Sodium: 141 mmol/L (ref 134–144)

## 2019-06-21 LAB — HCV COMMENT:

## 2019-06-24 ENCOUNTER — Telehealth: Payer: Self-pay | Admitting: *Deleted

## 2019-06-24 NOTE — Telephone Encounter (Signed)
Prior approval for synjardy completed via [place] [employer].  Med approved for 06/24/2019 - 06/23/2020  Prior approval # [id number].    [facility] informed.  Christen Bame, CMA

## 2019-06-27 ENCOUNTER — Other Ambulatory Visit: Payer: Self-pay

## 2019-06-27 ENCOUNTER — Ambulatory Visit (INDEPENDENT_AMBULATORY_CARE_PROVIDER_SITE_OTHER): Payer: Medicaid Other | Admitting: *Deleted

## 2019-06-27 ENCOUNTER — Telehealth: Payer: Self-pay | Admitting: *Deleted

## 2019-06-27 DIAGNOSIS — Z3042 Encounter for surveillance of injectable contraceptive: Secondary | ICD-10-CM | POA: Diagnosis not present

## 2019-06-27 MED ORDER — MEDROXYPROGESTERONE ACETATE 150 MG/ML IM SUSY
150.0000 mg | PREFILLED_SYRINGE | Freq: Once | INTRAMUSCULAR | Status: AC
  Administered 2019-06-27: 150 mg via INTRAMUSCULAR

## 2019-06-27 NOTE — Telephone Encounter (Signed)
Pt is requesting a call about her lab results. Will forward to ordering MD.  Christen Bame, CMA

## 2019-06-27 NOTE — Progress Notes (Signed)
Patient here today for Depo Provera injection and is within her dates.    Last contraceptive appt was 04/2018 ( will make next one with MD)  Depo given in [place] today.  Site unremarkable & patient tolerated injection.    Next injection due 09/12/19 - 09/26/19.  Reminder card given.    Christen Bame, CMA

## 2019-06-28 NOTE — Telephone Encounter (Signed)
Good evening Ms Kimberlin,  Your lab result show that your kidney functions have gotten slightly better.   Your cholesterol has increased from 206 to 268 since your last visit. The results of the Hepatitis C is negative. I recommend follow up with your PCP to discuss options for lowering your cholesterol level.  Carollee Leitz MD

## 2019-06-29 ENCOUNTER — Encounter: Payer: Self-pay | Admitting: Family Medicine

## 2019-09-19 ENCOUNTER — Other Ambulatory Visit: Payer: Self-pay

## 2019-09-19 ENCOUNTER — Ambulatory Visit (INDEPENDENT_AMBULATORY_CARE_PROVIDER_SITE_OTHER): Payer: Medicaid Other

## 2019-09-19 DIAGNOSIS — Z3042 Encounter for surveillance of injectable contraceptive: Secondary | ICD-10-CM

## 2019-09-19 MED ORDER — MEDROXYPROGESTERONE ACETATE 150 MG/ML IM SUSP
150.0000 mg | Freq: Once | INTRAMUSCULAR | Status: AC
Start: 2019-09-19 — End: 2019-09-19
  Administered 2019-09-19: 150 mg via INTRAMUSCULAR

## 2019-09-19 NOTE — Progress Notes (Signed)
Patient presents in nurse clinic for depo provera injection. Patient is within dates. Depo injection given RUOQ, site unremarkable. Patient due for next injection, 12/05/2019-12/19/2019.

## 2019-10-10 ENCOUNTER — Encounter: Payer: Self-pay | Admitting: Family Medicine

## 2019-10-10 ENCOUNTER — Encounter (HOSPITAL_COMMUNITY): Payer: Self-pay | Admitting: Student

## 2019-10-10 ENCOUNTER — Other Ambulatory Visit: Payer: Self-pay

## 2019-10-10 ENCOUNTER — Emergency Department (HOSPITAL_COMMUNITY)
Admission: EM | Admit: 2019-10-10 | Discharge: 2019-10-10 | Disposition: A | Discharge status: Home / Self Care | Payer: Medicaid Other | Attending: Emergency Medicine | Admitting: Emergency Medicine

## 2019-10-10 ENCOUNTER — Ambulatory Visit (INDEPENDENT_AMBULATORY_CARE_PROVIDER_SITE_OTHER): Payer: Medicaid Other | Admitting: Family Medicine

## 2019-10-10 VITALS — BP 160/100 | HR 80 | Temp 98.5°F | Ht 72.0 in | Wt 274.0 lb

## 2019-10-10 DIAGNOSIS — E7800 Pure hypercholesterolemia, unspecified: Secondary | ICD-10-CM | POA: Diagnosis not present

## 2019-10-10 DIAGNOSIS — E119 Type 2 diabetes mellitus without complications: Secondary | ICD-10-CM | POA: Insufficient documentation

## 2019-10-10 DIAGNOSIS — I10 Essential (primary) hypertension: Secondary | ICD-10-CM | POA: Diagnosis not present

## 2019-10-10 DIAGNOSIS — L720 Epidermal cyst: Secondary | ICD-10-CM | POA: Insufficient documentation

## 2019-10-10 DIAGNOSIS — Z7984 Long term (current) use of oral hypoglycemic drugs: Secondary | ICD-10-CM | POA: Diagnosis not present

## 2019-10-10 DIAGNOSIS — Z7982 Long term (current) use of aspirin: Secondary | ICD-10-CM | POA: Insufficient documentation

## 2019-10-10 DIAGNOSIS — Z79899 Other long term (current) drug therapy: Secondary | ICD-10-CM | POA: Insufficient documentation

## 2019-10-10 DIAGNOSIS — F17210 Nicotine dependence, cigarettes, uncomplicated: Secondary | ICD-10-CM | POA: Insufficient documentation

## 2019-10-10 DIAGNOSIS — E785 Hyperlipidemia, unspecified: Secondary | ICD-10-CM

## 2019-10-10 DIAGNOSIS — M542 Cervicalgia: Secondary | ICD-10-CM | POA: Diagnosis present

## 2019-10-10 LAB — POCT GLYCOSYLATED HEMOGLOBIN (HGB A1C): HbA1c, POC (controlled diabetic range): 6.6 % (ref 0.0–7.0)

## 2019-10-10 MED ORDER — HYDROCHLOROTHIAZIDE 12.5 MG PO TABS: 12.5000 mg | ORAL_TABLET | Freq: Every day | ORAL | 3 refills | Status: DC

## 2019-10-10 MED ORDER — LISINOPRIL 40 MG PO TABS
40.0000 mg | ORAL_TABLET | Freq: Every day | ORAL | 3 refills | Status: DC
Start: 2019-10-10 — End: 2019-11-22

## 2019-10-10 MED ORDER — NAPROXEN 500 MG PO TABS: 500.0000 mg | ORAL_TABLET | Freq: Two times a day (BID) | ORAL | 0 refills | Status: DC

## 2019-10-10 NOTE — ED Triage Notes (Signed)
Pt has appt with PMD at 1345 and came to ED first stating she couldn't wait that long.  Had cyst to R lateral neck 3 years ago and it was removed, states returned to same area.  Pain when sleeping.   No open areas or redness noted.

## 2019-10-10 NOTE — Assessment & Plan Note (Signed)
Uncontrolled hypertension today. Discontinued lisinopril-hydrochlorothiazide. Prescribed lisinopril 40 mg (double the dose) and 12.5 mg hydrochlorothiazide separate tablets. Advised patient to return for BP check in 2 to 3 weeks with UA for microalbuminuria.

## 2019-10-10 NOTE — Discharge Instructions (Signed)
You were seen in the emergency department today for a recurrent cyst to your neck.  We are sending her with naproxen to help with pain/swelling..   - Naproxen is a nonsteroidal anti-inflammatory medication that will help with pain and swelling. Be sure to take this medication as prescribed with food, 1 pill every 12 hours,  It should be taken with food, as it can cause stomach upset, and more seriously, stomach bleeding. Do not take other nonsteroidal anti-inflammatory medications with this such as Advil, Motrin, Aleve, Mobic, Goodie Powder, or Motrin.    You make take Tylenol per over the counter dosing with these medications.   We have prescribed you new medication(s) today. Discuss the medications prescribed today with your pharmacist as they can have adverse effects and interactions with your other medicines including over the counter and prescribed medications. Seek medical evaluation if you start to experience new or abnormal symptoms after taking one of these medicines, seek care immediately if you start to experience difficulty breathing, feeling of your throat closing, facial swelling, or rash as these could be indications of a more serious allergic reaction   We would like you to follow-up with Dr. Windle Guard, the prior general surgeon who operated on your cyst, please do so within the next 1 week, call within the next 3 days to make an appointment.  Your blood pressure was also noted to be elevated today in the ER, please follow-up closely with your primary care provider within 1 week for a recheck.  Return to the ER for new or worsening symptoms including but not limited to redness around the sweats, fever, blurry vision, headache, numbness, weakness, passing out, chest pain, trouble breathing, or any other concerns.

## 2019-10-10 NOTE — Assessment & Plan Note (Signed)
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  Will refer to General surgery stat have recommended patient to contact her previous surgeon as she may still be on their system and reach them quicker.

## 2019-10-10 NOTE — Progress Notes (Signed)
   Subjective:    Patient ID: Darlene Bennett, female    DOB: December 23, 1975, 43 y.o.   MRN: [id number]   CC: Darlene Bennett is a 43 year old female who presents today for annual physical  HPI:  Epidermoid cyst  Seen in ED today for right sided, posterior, nonmobile cyst which appeared 1 week ago and has been hurting since then.  Denies purulent discharge, bleeding or fevers. Denies other neck masses or lumps.  Has a history of epidermoid cyst the same site 2-3 years ago which was removed by Dr. Sumner Boast.  ED recommended naproxen.   Media Information    Document Information  Photos    10/10/2019 14:26  Attached To:  Office Visit on 10/10/19 with Lattie Haw, MD  Source Information  Lattie Haw, MD  [facility]    DM Last HbA1c 6.4 in July 2020. Takes Synjarty, 5-1000mg  BID which she started 1 to 2 years ago. Good compliance and denies side effects CBGs at home: 115, 129 Hypoycemic symptoms: had an episode a few weeks ago week ago, felt weak, difficulty to move, jittery.  Had some candy and symptoms resolved. infrequently Leads healthy lifestyle: Not eat pasta,bread, rice and eats plenty of vegetables and meat sometimes Denies vision problems, no polyuria or frequency  HTN BP 99991111 systolic in the ED today, 160/100 in the office today Medications: lisinopril HCTZ 20-12.5 daily Good compliance, denies side effects except urinary frequency Does not check BP at home  Denies dizziness, chest pain, dyspnea or edema  Hyperlipidemia Atorvastatin 40 mg, started in July 2020 Good compliance denies side effects Denies right upper quadrant pain, myalgia  Smoking status reviewed   ROS: pertinent noted in the HPI    Past medical history, surgical, family, and social history reviewed and updated in the EMR as appropriate. Reviewed problem list.   Objective:  BP (!) 160/100   Pulse 80   Temp 98.5 F (36.9 C) (Oral)   Ht 6' (1.829 m)   Wt 274 lb (124.3  kg)   SpO2 99%   BMI 37.16 kg/m   Vitals and nursing note reviewed  General: NAD, pleasant, able to participate in exam, looking Cardiac: RRR, S1 S2 present. normal heart sounds, no murmurs. Respiratory: CTAB, normal effort, No wheezes, rales or rhonchi Extremities: no edema or cyanosis. Skin: warm and dry, no rashes noted Neuro: alert, cranial nerves 2-12 in tact, no obvious focal deficits Psych: Normal affect and mood   Assessment & Plan:    Essential hypertension Uncontrolled hypertension today. Discontinued lisinopril-hydrochlorothiazide. Prescribed lisinopril 40 mg (double the dose) and 12.5 mg hydrochlorothiazide separate tablets. Advised patient to return for BP check in 2 to 3 weeks with UA for microalbuminuria.  Hyperlipemia Recheck lipid panel in 2 to 3 weeks.  If still elevated will consider switching medications or additional medication for hyperlipidemia.  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  Will refer to General surgery stat have recommended patient to contact her previous surgeon as she may still be on their system and reach them quicker.  Type 2 diabetes mellitus HbA1c 6.6 today.  At goal continue Synjardy.  Asked patient to see eye doctor for diabetic eye exam as is due.     Lattie Haw, MD  [facility] PGY-1

## 2019-10-10 NOTE — Assessment & Plan Note (Signed)
HbA1c 6.6 today.  At goal continue Synjardy.  Asked patient to see eye doctor for diabetic eye exam as is due.

## 2019-10-10 NOTE — Assessment & Plan Note (Signed)
Recheck lipid panel in 2 to 3 weeks.  If still elevated will consider switching medications or additional medication for hyperlipidemia.

## 2019-10-10 NOTE — Patient Instructions (Signed)
Hi Darlene Bennett, [place] to see you today in clinic.  HbA1c was 6.6 today indicating that you have good control of your diabetes.  We will continue your current diet patient.  Your blood pressure has been quite high so I am discontinuing your previous blood pressure medications and starting you on lisinopril 40 mg and hydrochlorothiazide 12.5 mg.   I will refer you to the general surgeons for the epidermoid cyst for please contact them to book a appointment if possible as you are probably still in the system from your previous operation.  Please schedule a blood pressure check in 2 to 3 weeks time, a repeat lipid blood test and a urine check for protein.  Look for to see you soon and keep up the good work!  Meantime please do not hesitate to contact our clinic if you have further questions or any issues.   Best wishes Dr. Posey Pronto

## 2019-10-10 NOTE — ED Provider Notes (Signed)
[facility] EMERGENCY DEPARTMENT Provider Note   CSN: [id number] Arrival date & time: 10/10/19  1047     History   Chief Complaint Chief Complaint  Patient presents with  . Neck Pain    HPI Darlene Bennett is a 43 y.o. female with history of hypertension, hyperlipidemia, & T2DM who presents to the ED with complaints of recurrent neck cyst which returned 1 week ago.  Patient states she had an epidermoid cyst excised back in 2017, she has been doing well since then, however about a week ago she noted the cyst came back.  She states the cyst can cause discomfort in certain positions and when she is trying to get comfortable sleeping, but is not severely painful.  Other than positions no alleviating or aggravating factors.  She has not noted any redness, drainage, or warmth to the area.  She denies fever, chills, or URI symptoms.  Her blood pressure was noted to be elevated in the emergency department, she does have a history of hypertension, she did take her medicines this morning, she denies headache, vision change, numbness, weakness, dizziness, chest pain, or shortness of breath.     HPI  Past Medical History:  Diagnosis Date  . Gestational diabetes    insulin  . Gestational diabetes mellitus, antepartum 02/14/2013  . History of gestational diabetes   . Hypertension    preeclampsia with previous pregnancy  . Scalp cyst     Patient Active Problem List   Diagnosis Date Noted  . Chronic back pain 10/31/2018  . Right sciatic nerve pain 10/31/2018  . Routine cervical smear 04/23/2018  . Preventative health care 02/20/2016  . Contraceptive management 05/25/2015  . Type 2 diabetes mellitus ([place]) 04/24/2015  . Microalbuminuria due to type 2 diabetes mellitus ([place]) 03/25/2015  . Hyperlipemia 03/09/2015  . Tobacco use disorder 03/09/2015  . Essential hypertension 11/15/2012    Past Surgical History:  Procedure Laterality Date  . CYST EXCISION N/A 09/15/2016   Procedure: EXCISION POSTERIOR SCALP CYST;  Surgeon: Clovis Riley, MD;  Location: [facility];  Service: General;  Laterality: N/A;  . TUBAL LIGATION     2007     OB History    Gravida  4   Para  4   Term  3   Preterm  1   AB  0   Living  4     SAB  0   TAB  0   Ectopic  0   Multiple  0   Live Births  4            Home Medications    Prior to Admission medications   Medication Sig Start Date End Date Taking? Authorizing Provider  ACCU-CHEK FASTCLIX LANCETS MISC 1 each by Does not apply route 4 (four) times daily -  before meals and at bedtime. Check sugar 6 x daily 05/11/17   Katheren Shams, DO  aspirin EC 81 MG tablet Take 1 tablet (81 mg total) by mouth daily. 06/20/19   Carollee Leitz, MD  atorvastatin (LIPITOR) 40 MG tablet Take 1 tablet (40 mg total) by mouth daily. 06/20/19   Carollee Leitz, MD  Blood Glucose Monitoring Suppl (ACCU-CHEK NANO SMARTVIEW) W/DEVICE KIT 1 Device by Does not apply route 4 (four) times daily -  before meals and at bedtime. 03/09/15   Katheren Shams, DO  Empagliflozin-metFORMIN HCl (SYNJARDY) 04-999 MG TABS Take 1 tablet by mouth 2 (two) times daily. 06/20/19   Carollee Leitz,  MD  glucose blood test strip Use to check blood sugar once daily or as directed. 01/21/19   Martyn Malay, MD  lisinopril-hydrochlorothiazide (ZESTORETIC) 20-12.5 MG tablet Take 1 tablet by mouth daily. 06/20/19   Carollee Leitz, MD  medroxyPROGESTERone (DEPO-PROVERA) 150 MG/ML injection Inject 1 mL (150 mg total) into the muscle every 3 (three) months. 05/10/13   Poe, Deirdre C, CNM  naproxen (NAPROSYN) 500 MG tablet Take 1 tablet (500 mg total) by mouth 2 (two) times daily. 10/10/19   Lyndall Bellot, Glynda Jaeger, PA-C    Family History Family History  Problem Relation Age of Onset  . Heart disease Mother   . Hypertension Mother   . Diabetes Mother   . Hypertension Father   . Diabetes Father     Social History Social History   Tobacco Use  . Smoking status: Current Every  Day Smoker    Packs/day: 0.50    Years: 22.00    Pack years: 11.00    Types: Cigarettes  . Smokeless tobacco: Never Used  Substance Use Topics  . Alcohol use: No    Alcohol/week: 0.0 standard drinks  . Drug use: No     Allergies   Patient has no known allergies.   Review of Systems Review of Systems  Constitutional: Negative for chills and fever.  HENT: Negative for congestion, ear pain and sore throat.   Eyes: Negative for visual disturbance.  Respiratory: Negative for shortness of breath.   Cardiovascular: Negative for chest pain.  Gastrointestinal: Negative for abdominal pain.  Skin:       + for neck cyst  Neurological: Negative for weakness and numbness.    Physical Exam Updated Vital Signs BP (!) 186/120 (BP Location: Right Arm)   Pulse 76   Temp 99.7 F (37.6 C) (Oral)   Resp 16   Ht 6' (1.829 m)   Wt 120.2 kg   SpO2 97%   BMI 35.94 kg/m   Physical Exam Vitals signs and nursing note reviewed.  Constitutional:      General: She is not in acute distress.    Appearance: She is well-developed.  HENT:     Head: Normocephalic and atraumatic.     Right Ear: Ear canal normal. Tympanic membrane is not perforated, erythematous, retracted or bulging.     Left Ear: Ear canal normal. Tympanic membrane is not perforated, erythematous, retracted or bulging.     Ears:     Comments: No mastoid erythema/swelling/tenderness.     Nose:     Right Sinus: No maxillary sinus tenderness or frontal sinus tenderness.     Left Sinus: No maxillary sinus tenderness or frontal sinus tenderness.     Mouth/Throat:     Pharynx: Uvula midline. No oropharyngeal exudate or posterior oropharyngeal erythema.     Comments: Posterior oropharynx is symmetric appearing. Patient tolerating own secretions without difficulty. No trismus. No drooling. No hot potato voice. No swelling beneath the tongue, submandibular compartment is soft.  Eyes:     General:        Right eye: No discharge.         Left eye: No discharge.     Extraocular Movements: Extraocular movements intact.     Conjunctiva/sclera: Conjunctivae normal.     Pupils: Pupils are equal, round, and reactive to light.  Neck:     Musculoskeletal: Normal range of motion and neck supple. No edema or neck rigidity.   Cardiovascular:     Rate and Rhythm: Normal rate and regular  rhythm.     Heart sounds: No murmur.  Pulmonary:     Effort: Pulmonary effort is normal. No respiratory distress.     Breath sounds: Normal breath sounds. No wheezing, rhonchi or rales.  Abdominal:     General: There is no distension.     Palpations: Abdomen is soft.     Tenderness: There is no abdominal tenderness.  Lymphadenopathy:     Cervical: No cervical adenopathy.  Skin:    General: Skin is warm and dry.     Findings: No rash.  Neurological:     General: No focal deficit present.     Mental Status: She is alert.  Psychiatric:        Behavior: Behavior normal.      ED Treatments / Results  Labs (all labs ordered are listed, but only abnormal results are displayed) Labs Reviewed - No data to display  EKG None  Radiology No results found.  Procedures Procedures (including critical care time)  Medications Ordered in ED Medications - No data to display   Initial Impression / Assessment and Plan / ED Course  I have reviewed the triage vital signs and the nursing notes.  Pertinent labs & imaging results that were available during my care of the patient were reviewed by me and considered in my medical decision making (see chart for details).   Patient presents to the emergency department with complaints of recurrent cyst to the neck.  She is nontoxic-appearing, no apparent distress, vitals WNL with exception of her elevated blood pressure, she has no neurologic/cardiac/respiratory complaints, benign exam, do not suspect hypertensive emergency, she will need PCP recheck of her blood pressure. On exam she does have a cystic  like mass at the base of the R neck with overlying well healed surgical scar.  There is no palpable fluctuance, does not seem consistent with abscess requiring emergent I&D.  There is no overlying erythema, warmth, or purulent drainage, do not suspect infected cyst.  Feel this most likely a recurrent epidermoid cyst given history of similar.  Will provide NSAIDs to help with discomfort relief.  Will have patient follow-up closely with her PCP for elevated BP and provide information to return to her prior general surgeon who excised this in the past. I discussed  treatment plan, need for follow-up, and return precautions with the patient. Provided opportunity for questions, patient confirmed understanding and is in agreement with plan.    Final Clinical Impressions(s) / ED Diagnoses   Final diagnoses:  Hypertension, unspecified type  Epidermal cyst of neck    ED Discharge Orders         Ordered    naproxen (NAPROSYN) 500 MG tablet  2 times daily     10/10/19 [address], PA-C 10/10/19 1333    Carmin Muskrat, MD 10/10/19 1343

## 2019-10-24 ENCOUNTER — Other Ambulatory Visit: Payer: Self-pay

## 2019-10-24 ENCOUNTER — Ambulatory Visit: Payer: Medicaid Other

## 2019-10-24 ENCOUNTER — Other Ambulatory Visit (INDEPENDENT_AMBULATORY_CARE_PROVIDER_SITE_OTHER): Payer: Medicaid Other

## 2019-10-24 VITALS — BP 154/86 | HR 88

## 2019-10-24 DIAGNOSIS — E7800 Pure hypercholesterolemia, unspecified: Secondary | ICD-10-CM

## 2019-10-24 DIAGNOSIS — I10 Essential (primary) hypertension: Secondary | ICD-10-CM

## 2019-10-24 DIAGNOSIS — E119 Type 2 diabetes mellitus without complications: Secondary | ICD-10-CM

## 2019-10-24 LAB — POCT UA - MICROSCOPIC ONLY

## 2019-10-24 LAB — POCT URINALYSIS DIP (MANUAL ENTRY)
Bilirubin, UA: NEGATIVE
Blood, UA: NEGATIVE
Glucose, UA: NEGATIVE mg/dL
Ketones, POC UA: NEGATIVE mg/dL
Nitrite, UA: NEGATIVE
Protein Ur, POC: 100 mg/dL — AB
Spec Grav, UA: 1.02 (ref 1.010–1.025)
Urobilinogen, UA: 0.2 E.U./dL
pH, UA: 6.5 (ref 5.0–8.0)

## 2019-10-24 LAB — POCT UA - MICROALBUMIN
Albumin/Creatinine Ratio, Urine, POC: >300
Creatinine, POC: 100 mg/dL
Microalbumin Ur, POC: 150 mg/L

## 2019-10-24 NOTE — Progress Notes (Signed)
Patient presents in nurse clinic for blood pressure check. Patients BP today 160/90, checked in left arm with large cuff. Patient stated she has only been taking Lisinopril HCTZ 20-12.5mg . Patient stated this what the pharmacy gave her on 10/16/2019. I informed patient per 11/5 office notes, she was supposed to discontinue Lisinopril HCTZ 20-12.5 and start taking Lisinopril 40mg  and Hydrochlorothiazide 12.5mg , both separate tablets. Patient stated she was not aware of this change.   I rechecked BP after ~15 minutes, 154/86. Patient stated she has no sxs and feels fine. Patient would like for me to clarify with PCP what blood pressure medication she is suppose to be taking. She will continue Lisinopril HCTZ for now.

## 2019-10-25 LAB — LIPID PANEL
Chol/HDL Ratio: 6.3 ratio — ABNORMAL HIGH (ref 0.0–4.4)
Cholesterol, Total: 221 mg/dL — ABNORMAL HIGH (ref 100–199)
HDL: 35 mg/dL — ABNORMAL LOW (ref >39)
LDL Chol Calc (NIH): 164 mg/dL — ABNORMAL HIGH (ref 0–99)
Triglycerides: 123 mg/dL (ref 0–149)
VLDL Cholesterol Cal: 22 mg/dL (ref 5–40)

## 2019-11-19 ENCOUNTER — Other Ambulatory Visit: Payer: Self-pay | Admitting: Family Medicine

## 2019-11-19 DIAGNOSIS — E785 Hyperlipidemia, unspecified: Secondary | ICD-10-CM

## 2019-11-19 MED ORDER — ATORVASTATIN CALCIUM 40 MG PO TABS: 40.0000 mg | ORAL_TABLET | Freq: Every day | ORAL | 3 refills | Status: DC

## 2019-11-22 ENCOUNTER — Other Ambulatory Visit: Payer: Self-pay

## 2019-11-24 MED ORDER — LISINOPRIL 40 MG PO TABS: 40.0000 mg | ORAL_TABLET | Freq: Every day | ORAL | 3 refills | Status: DC

## 2019-12-12 ENCOUNTER — Other Ambulatory Visit: Payer: Self-pay

## 2019-12-12 ENCOUNTER — Ambulatory Visit (INDEPENDENT_AMBULATORY_CARE_PROVIDER_SITE_OTHER): Payer: Medicaid Other | Admitting: *Deleted

## 2019-12-12 DIAGNOSIS — Z3042 Encounter for surveillance of injectable contraceptive: Secondary | ICD-10-CM

## 2019-12-12 MED ORDER — MEDROXYPROGESTERONE ACETATE 150 MG/ML IM SUSY
150.0000 mg | PREFILLED_SYRINGE | Freq: Once | INTRAMUSCULAR | Status: AC
  Administered 2019-12-12: 150 mg via INTRAMUSCULAR

## 2019-12-12 NOTE — Progress Notes (Signed)
Patient here today for Depo Provera injection and is within her dates.    Last contraceptive appt was 06/20/19.  Depo given in [place] today.  Site unremarkable & patient tolerated injection.    Next injection due March 25 - April 8.  Reminder card given.    Christen Bame, CMA

## 2019-12-24 ENCOUNTER — Other Ambulatory Visit: Payer: Self-pay

## 2019-12-24 DIAGNOSIS — E119 Type 2 diabetes mellitus without complications: Secondary | ICD-10-CM

## 2019-12-24 MED ORDER — SYNJARDY 5-1000 MG PO TABS: 1.0000 | ORAL_TABLET | Freq: Two times a day (BID) | ORAL | 3 refills | Status: DC

## 2020-01-16 ENCOUNTER — Encounter (HOSPITAL_COMMUNITY): Payer: Self-pay

## 2020-01-16 ENCOUNTER — Emergency Department (HOSPITAL_COMMUNITY)
Admission: EM | Admit: 2020-01-16 | Discharge: 2020-01-16 | Disposition: A | Discharge status: Home / Self Care | Payer: Medicaid Other | Attending: Emergency Medicine | Admitting: Emergency Medicine

## 2020-01-16 ENCOUNTER — Other Ambulatory Visit: Payer: Self-pay

## 2020-01-16 DIAGNOSIS — Z7982 Long term (current) use of aspirin: Secondary | ICD-10-CM | POA: Insufficient documentation

## 2020-01-16 DIAGNOSIS — F17210 Nicotine dependence, cigarettes, uncomplicated: Secondary | ICD-10-CM | POA: Insufficient documentation

## 2020-01-16 DIAGNOSIS — R519 Headache, unspecified: Secondary | ICD-10-CM | POA: Diagnosis not present

## 2020-01-16 DIAGNOSIS — Z7984 Long term (current) use of oral hypoglycemic drugs: Secondary | ICD-10-CM | POA: Insufficient documentation

## 2020-01-16 DIAGNOSIS — E119 Type 2 diabetes mellitus without complications: Secondary | ICD-10-CM | POA: Diagnosis not present

## 2020-01-16 DIAGNOSIS — Z79899 Other long term (current) drug therapy: Secondary | ICD-10-CM | POA: Diagnosis not present

## 2020-01-16 DIAGNOSIS — R030 Elevated blood-pressure reading, without diagnosis of hypertension: Secondary | ICD-10-CM

## 2020-01-16 DIAGNOSIS — I10 Essential (primary) hypertension: Secondary | ICD-10-CM | POA: Insufficient documentation

## 2020-01-16 LAB — CBG MONITORING, ED: Glucose-Capillary: 107 mg/dL — ABNORMAL HIGH (ref 70–99)

## 2020-01-16 MED ORDER — ACETAMINOPHEN 325 MG PO TABS: 650.0000 mg | ORAL_TABLET | Freq: Once | ORAL | Status: DC

## 2020-01-16 MED ORDER — METOCLOPRAMIDE HCL 5 MG/ML IJ SOLN: 10.0000 mg | Freq: Once | INTRAMUSCULAR | Status: DC

## 2020-01-16 MED ORDER — DIPHENHYDRAMINE HCL 50 MG/ML IJ SOLN: 12.5000 mg | Freq: Once | INTRAMUSCULAR | Status: DC

## 2020-01-16 MED ORDER — SODIUM CHLORIDE 0.9 % IV BOLUS: 500.0000 mL | Freq: Once | INTRAVENOUS | Status: DC

## 2020-01-16 NOTE — Discharge Instructions (Addendum)
You have been diagnosed today with Headache.  At this time there does not appear to be the presence of an emergent medical condition, however there is always the potential for conditions to change. Please read and follow the below instructions.  Please return to the Emergency Department immediately for any new or worsening symptoms. Please be sure to follow up with your Primary Care Provider within one week regarding your visit today; please call their office to schedule an appointment even if you are feeling better for a follow-up visit. You may take over-the-counter anti-inflammatory medication such as Tylenol as directed on packaging to help with your headache.  Please drink plenty of water and get plenty of rest.  Please call your primary care provider today to schedule a follow-up appointment this week for blood pressure recheck and medication management.  Get help right away if you: Get a very bad headache. Start to feel mixed up (confused). Feel weak or numb. Feel faint. Have very bad pain in your: Chest. Belly (abdomen). Throw up more than once. Have trouble breathing. Your headache gets very bad quickly. Your headache gets worse after a lot of physical activity. You keep throwing up. You have a stiff neck. You have trouble seeing. You have trouble speaking. You have pain in the eye or ear. Your muscles are weak or you lose muscle control. You lose your balance or have trouble walking. You feel like you will pass out (faint) or you pass out. You are mixed up (confused). You have a seizure. You have any new/concerning or worsening of symptoms  Please read the additional information packets attached to your discharge summary.  Do not take your medicine if  develop an itchy rash, swelling in your mouth or lips, or difficulty breathing; call 911 and seek immediate emergency medical attention if this occurs.  Note: Portions of this text may have been transcribed using voice  recognition software. Every effort was made to ensure accuracy; however, inadvertent computerized transcription errors may still be present.

## 2020-01-16 NOTE — ED Provider Notes (Signed)
[facility] EMERGENCY DEPARTMENT Provider Note   CSN: [id number] Arrival date & time: 01/16/20  1153     History Chief Complaint  Patient presents with   Headache   Hypertension    Darlene Bennett is a 44 y.o. female history of hypertension, diabetes, hyperlipidemia.  Patient presents today for headache gradual onset 2 days ago, she describes a left sided throbbing sensation mild-moderate intensity constant no clear aggravating factors, nonradiating, no alleviating factors.  She has not attempted any medications prior to arrival for her symptoms.  Patient reports history of similar headaches in the past, she reports that his pain several months since her last headache like this but it feels the same and denies any abnormal features.  Additionally patient is concerned for her blood pressure today, she reports she is compliant with her lisinopril therapy and is scheduled to start HCTZ by her PCP this week.  She does not check her blood pressure at home.  Denies fall/injury, vision changes, difficulty speaking, confusion, loss of balance/dizziness, neck stiffness, sore throat, chest pain/shortness of breath, abdominal pain, nausea/vomiting, numbness/weakness, tingling or any additional concerns.  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[place]) 03/25/2015   Hyperlipemia 03/09/2015   Tobacco use  disorder 03/09/2015   Essential hypertension 11/15/2012    Past Surgical History:  Procedure Laterality Date   CYST EXCISION N/A 09/15/2016   Procedure: EXCISION POSTERIOR SCALP CYST;  Surgeon: Clovis Riley, MD;  Location: [facility];  Service: General;  Laterality: N/A;   TUBAL LIGATION     2007     OB History    Gravida  4   Para  4   Term  3   Preterm  1   AB  0   Living  4     SAB  0   TAB  0   Ectopic  0   Multiple  0   Live Births  4           Family History  Problem Relation Age of Onset   Heart disease Mother    Hypertension Mother    Diabetes Mother    Hypertension Father    Diabetes Father     Social History   Tobacco Use   Smoking status: Current Every Day Smoker    Packs/day: 0.50    Years: 22.00    Pack years: 11.00    Types: Cigarettes   Smokeless tobacco: Never Used  Substance Use Topics   Alcohol use: No    Alcohol/week: 0.0 standard drinks   Drug use: No    Home Medications Prior to Admission medications   Medication Sig Start Date End Date Taking? Authorizing Provider  ACCU-CHEK FASTCLIX LANCETS MISC 1 each by Does not apply route 4 (four) times daily -  before meals and at bedtime. Check sugar 6 x daily 05/11/17   Luiz Blare  Y, DO  aspirin EC 81 MG tablet Take 1 tablet (81 mg total) by mouth daily. 06/20/19   Carollee Leitz, MD  atorvastatin (LIPITOR) 40 MG tablet Take 1 tablet (40 mg total) by mouth daily. 11/19/19   Lattie Haw, MD  Blood Glucose Monitoring Suppl (ACCU-CHEK NANO SMARTVIEW) W/DEVICE KIT 1 Device by Does not apply route 4 (four) times daily -  before meals and at bedtime. 03/09/15   Katheren Shams, DO  Empagliflozin-metFORMIN HCl (SYNJARDY) 04-999 MG TABS Take 1 tablet by mouth 2 (two) times daily. 12/24/19   Lattie Haw, MD  glucose blood test strip Use to check blood sugar once daily or as directed. 01/21/19   Martyn Malay, MD  hydrochlorothiazide (HYDRODIURIL) 12.5 MG tablet Take 1 tablet  (12.5 mg total) by mouth daily. 10/10/19   Inez Catalina, MD  lisinopril (ZESTRIL) 40 MG tablet Take 1 tablet (40 mg total) by mouth at bedtime. 11/24/19   Lattie Haw, MD  medroxyPROGESTERone (DEPO-PROVERA) 150 MG/ML injection Inject 1 mL (150 mg total) into the muscle every 3 (three) months. 05/10/13   Poe, Deirdre C, CNM  naproxen (NAPROSYN) 500 MG tablet Take 1 tablet (500 mg total) by mouth 2 (two) times daily. 10/10/19   Petrucelli, Glynda Jaeger, PA-C    Allergies    Patient has no known allergies.  Review of Systems   Review of Systems Ten systems are reviewed and are negative for acute change except as noted in the HPI  Physical Exam Updated Vital Signs BP (!) 146/102 (BP Location: Right Arm)    Pulse 88    Temp 98.3 F (36.8 C) (Oral)    Resp 18    Ht 6' (1.829 m)    Wt 122.5 kg    SpO2 98%    BMI 36.62 kg/m   Physical Exam Constitutional:      General: She is not in acute distress.    Appearance: Normal appearance. She is well-developed. She is not ill-appearing or diaphoretic.  HENT:     Head: Normocephalic and atraumatic.     Jaw: There is normal jaw occlusion.     Right Ear: External ear normal.     Left Ear: External ear normal.     Nose: Nose normal. No rhinorrhea.     Right Nostril: No epistaxis.     Left Nostril: No epistaxis.     Mouth/Throat:     Mouth: Mucous membranes are moist.     Pharynx: Oropharynx is clear.  Eyes:     General: Vision grossly intact. Gaze aligned appropriately.     Extraocular Movements: Extraocular movements intact.     Conjunctiva/sclera: Conjunctivae normal.     Pupils: Pupils are equal, round, and reactive to light.     Comments: No pain with extraocular motion  Neck:     Trachea: Trachea and phonation normal. No tracheal deviation.     Meningeal: Brudzinski's sign absent.  Pulmonary:     Effort: Pulmonary effort is normal. No respiratory distress.  Abdominal:     General: There is no distension.     Palpations: Abdomen is  soft.     Tenderness: There is no abdominal tenderness. There is no guarding or rebound.  Musculoskeletal:        General: Normal range of motion.     Cervical back: Full passive range of motion without pain, normal range of motion and neck supple.  Skin:    General: Skin is warm and dry.  Neurological:  Mental Status: She is alert.     GCS: GCS eye subscore is 4. GCS verbal subscore is 5. GCS motor subscore is 6.     Comments: Mental Status: Alert, oriented, thought content appropriate, able to give a coherent history. Speech fluent without evidence of aphasia. Able to follow 2 step commands without difficulty. Cranial Nerves: II: Peripheral visual fields grossly normal, pupils equal, round, reactive to light III,IV, VI: ptosis not present, extra-ocular motions intact bilaterally V,VII: smile symmetric, eyebrows raise symmetric, facial light touch sensation equal VIII: hearing grossly normal to voice X: uvula elevates symmetrically XI: bilateral shoulder shrug symmetric and strong XII: midline tongue extension without fassiculations Motor: Normal tone. 5/5 strength in upper and lower extremities bilaterally including strong and equal grip strength and dorsiflexion/plantar flexion Sensory: Sensation intact to light touch in all extremities.Negative Romberg.  Cerebellar: normal finger-to-nose with bilateral upper extremities. Normal heel-to -shin balance bilaterally of the lower extremity. No pronator drift.  Gait: normal gait and balance CV: distal pulses palpable throughout  Psychiatric:        Behavior: Behavior normal.    ED Results / Procedures / Treatments   Labs (all labs ordered are listed, but only abnormal results are displayed) Labs Reviewed  CBG MONITORING, ED - Abnormal; Notable for the following components:      Result Value   Glucose-Capillary 107 (*)    All other components within normal limits    EKG None  Radiology No results  found.  Procedures Procedures (including critical care time)  Medications Ordered in ED Medications - No data to display  ED Course  I have reviewed the triage vital signs and the nursing notes.  Pertinent labs & imaging results that were available during my care of the patient were reviewed by me and considered in my medical decision making (see chart for details).    MDM Rules/Calculators/A&P                     Maclaine Decelle is a 45 y.o. female who presents to ED for left-sided headache onset 2 days ago.  She reports history of similar headaches in the past and denies abnormal feature today.  Physical examination is reassuring, no neuro deficits, meningeal signs, signs of injury or tenderness of the face head or neck.  She denies any red flag symptoms, she is well-appearing and in no acute distress.  She has not attempted any medications prior to arrival for her symptoms.  Additionally she is concerned of an elevated blood pressure she does not take her blood pressure medication at home, she is compliant with her lisinopril and scheduled to start HCTZ by her PCP this week.  Doubt hypertensive urgency at this time, vital signs of previous ED visits reviewed and patient's blood pressure appears baseline today.  Suspect possible mild elevation of blood pressure may be secondary to her headache.  Plan of care at this time is to treat patient's symptoms and reassess.  There is no indication for imaging or further work-up at this time.  Benadryl, Reglan, Tylenol and fluid bolus ordered. - Less than 5 minutes after my initial evaluation I was informed the patient is requesting to leave.  She reports that she has things to do today and does not want to wait for an IV to start or for any medications to be given to her in the ED.  She plans to take Tylenol when she gets home and follow-up with her primary care provider.  Feel this is a reasonable plan at this time, she has no red flag symptoms or neurologic  findings to require further ED work-up at this time. I have reviewed return precautions including development of fever, nausea/vomiting or neurologic symptoms, vision changes, confusion, lethargy, difficulty speaking/walking, or other new/worsening/concerning symptoms.  Additionally I informed patient to have her blood pressure rechecked at her PCPs office this week and discuss medication management as needed.  No evidence for hypertensive urgency/emergency at this time. Patient states understanding of return precautions.  At this time there does not appear to be any evidence of an acute emergency medical condition and the patient appears stable for discharge with appropriate outpatient follow up. Diagnosis was discussed with patient who verbalizes understanding of care plan and is agreeable to discharge. I have discussed return precautions with patient who verbalizes understanding of return precautions. Patient encouraged to follow-up with their PCP. All questions answered.  Note: Portions of this report may have been transcribed using voice recognition software. Every effort was made to ensure accuracy; however, inadvertent computerized transcription errors may still be present. Final Clinical Impression(s) / ED Diagnoses Final diagnoses:  Nonintractable headache, unspecified chronicity pattern, unspecified headache type  Elevated blood pressure reading    Rx / DC Orders ED Discharge Orders    None       Gari Crown 01/16/20 1245    Isla Pence, MD 01/16/20 1503

## 2020-01-16 NOTE — ED Triage Notes (Signed)
Pt arrives POV for eval of hypertension. Pt reports she has noticed a headache x 2 days, and states that's how she knows her BP is high. Pt reports compliance w/ lisinopril, but states she believes it isn't working. 142/100 in triage. Denies CP, denies palpitations or other associated sx.

## 2020-02-27 ENCOUNTER — Ambulatory Visit (INDEPENDENT_AMBULATORY_CARE_PROVIDER_SITE_OTHER): Payer: Medicaid Other

## 2020-02-27 ENCOUNTER — Other Ambulatory Visit: Payer: Self-pay

## 2020-02-27 DIAGNOSIS — Z3042 Encounter for surveillance of injectable contraceptive: Secondary | ICD-10-CM | POA: Diagnosis not present

## 2020-02-27 MED ORDER — MEDROXYPROGESTERONE ACETATE 150 MG/ML IM SUSP
150.0000 mg | Freq: Once | INTRAMUSCULAR | Status: AC
  Administered 2020-02-27: 14:00:00 150 mg via INTRAMUSCULAR

## 2020-02-27 NOTE — Progress Notes (Signed)
Patient here today for Depo Provera injection and is within her dates.    Last contraceptive appt was 06/20/2019  Depo given in [place] today.  Site unremarkable & patient tolerated injection.    Next injection due June 10- June 24.  Reminder card given.    Talbot Grumbling, RN

## 2020-03-13 ENCOUNTER — Ambulatory Visit: Payer: Medicaid Other | Admitting: Family Medicine

## 2020-05-11 ENCOUNTER — Other Ambulatory Visit: Payer: Self-pay

## 2020-05-11 ENCOUNTER — Ambulatory Visit (INDEPENDENT_AMBULATORY_CARE_PROVIDER_SITE_OTHER): Payer: Medicaid Other | Admitting: Family Medicine

## 2020-05-11 VITALS — BP 160/70 | HR 74 | Ht 72.0 in | Wt 281.8 lb

## 2020-05-11 DIAGNOSIS — E119 Type 2 diabetes mellitus without complications: Secondary | ICD-10-CM

## 2020-05-11 DIAGNOSIS — I10 Essential (primary) hypertension: Secondary | ICD-10-CM

## 2020-05-11 LAB — POCT GLYCOSYLATED HEMOGLOBIN (HGB A1C): HbA1c, POC (controlled diabetic range): 6.8 % (ref 0.0–7.0)

## 2020-05-11 NOTE — Assessment & Plan Note (Signed)
BMI> 35. Provided lifestyle and diet counseling and encouraged increased exercise. Follow up with me in 1 month.

## 2020-05-11 NOTE — Assessment & Plan Note (Signed)
A1 6.8 today, diabetes at goal. Continue Synjardy at current dose. Encouraged pt to contact eye doctor for diabetic eye exam.

## 2020-05-11 NOTE — Assessment & Plan Note (Addendum)
Bps elevated to 160/70 and 167/70. Provided extensive lifestyle and diet recommendations for weight loss which will help with BP control. Recommended follow up with me in 1 month for BP. If still elevated will increase hydrochlorothiazide dose to at least 25mg .

## 2020-05-11 NOTE — Patient Instructions (Addendum)
  Diet Recommendations for Diabetes   Starchy (carb) foods: Bread, rice, pasta, potatoes, corn, cereal, grits, crackers, bagels, muffins, all baked goods.  (Fruits, milk, and yogurt also have carbohydrate, but most of these foods will not spike your blood sugar as most starchy foods will.)  A few fruits do cause high blood sugars; use small portions of bananas (limit to 1/2 at a time), grapes, watermelon, oranges, and most tropical fruits.    Protein foods: Meat, fish, poultry, eggs, dairy foods, and beans such as pinto and kidney beans (beans also provide carbohydrate).   1. Eat at least 3 meals and 1-2 snacks per day. Never go more than 4-5 hours while awake without eating. Eat breakfast within the first hour of getting up.   2. Limit starchy foods to TWO per meal and ONE per snack. ONE portion of a starchy  food is equal to the following:   - ONE slice of bread (or its equivalent, such as half of a hamburger bun).   - 1/2 cup of a "scoopable" starchy food such as potatoes or rice.   - 15 grams of Total Carbohydrate as shown on food label.  3. Include at every meal: a protein food, a carb food, and vegetables and/or fruit.   - Obtain twice the volume of veg's as protein or carbohydrate foods for both lunch and dinner.   - Fresh or frozen veg's are best.   - Keep frozen veg's on hand for a quick vegetable serving.      Lovely to see you today!  Your diabetes is very well controlled and your A1c is very good.  Keep up the great work.  Please see the diabetic diet above and try to incorporate as much as he can into meals.  Please try and stay as active as you can.  Losing weight will help control your blood pressures.  I will see you next month for weight loss follow-up.  Continue the diabetic and blood pressure medications as normal.  Blood pressure was a little bit high in clinic today.  If it still remains high next month we may increase the dose of the fluid pill.  Best wishes,  Dr. Posey Pronto

## 2020-05-11 NOTE — Progress Notes (Signed)
    SUBJECTIVE:   CHIEF COMPLAINT / HPI:   Anner Baity is a 44 year old female presents today for diabetes follow-up  Diabetes Last A1c was 6.6 7 months ago. Takes Synjardy 5-1000mg  2 times a day. Tolerating medication well. Denies polyuria, polydipsia or blurred vision. Does not measure CBGs at home. Denies hypoglycemic symptoms.  Hypertension Takes lisinopril 40 mg and hydrochlorothiazide 12.5 mg daily for peripheral edema. Does not take blood pressures at home. Tolerating medications well without side effects. Denies chest pain, SOB, palpitations or edema.  PERTINENT  PMH / PSH: DM, HTN,   OBJECTIVE:   BP (!) 160/70   Pulse 74   Ht 6' (1.829 m)   Wt 281 lb 12.8 oz (127.8 kg)   SpO2 100%   BMI 38.22 kg/m   General: Alert, no acute distress, pleasant y Cardio: Normal S1 and S2, RRR   Pulm: CTAB, normal WOB Abdomen: Bowel sounds normal. Abdomen soft and non-tender.  Extremities: No peripheral edema. Warm/ well perfused.   Neuro: Cranial nerves grossly intact  ASSESSMENT/PLAN:   Type 2 diabetes mellitus A1 6.8 today, diabetes at goal. Continue Synjardy at current dose. Encouraged pt to contact eye doctor for diabetic eye exam.  Essential hypertension Bps elevated to 160/70 and 167/70. Provided extensive lifestyle and diet recommendations for weight loss which will help with BP control. Recommended follow up with me in 1 month for BP. If still elevated will increase hydrochlorothiazide dose to at least 25mg .  Morbid obesity (HCC) BMI> 35. Provided lifestyle and diet counseling and encouraged increased exercise. Follow up with me in 1 month.     Lattie Haw, MD [place]

## 2020-05-14 ENCOUNTER — Ambulatory Visit (INDEPENDENT_AMBULATORY_CARE_PROVIDER_SITE_OTHER): Payer: Medicaid Other | Admitting: Family Medicine

## 2020-05-14 ENCOUNTER — Encounter: Payer: Self-pay | Admitting: Family Medicine

## 2020-05-14 ENCOUNTER — Other Ambulatory Visit: Payer: Self-pay

## 2020-05-14 VITALS — BP 150/100 | HR 80 | Ht 72.0 in | Wt 282.0 lb

## 2020-05-14 DIAGNOSIS — I10 Essential (primary) hypertension: Secondary | ICD-10-CM | POA: Diagnosis not present

## 2020-05-14 DIAGNOSIS — E119 Type 2 diabetes mellitus without complications: Secondary | ICD-10-CM | POA: Diagnosis not present

## 2020-05-14 DIAGNOSIS — Z3042 Encounter for surveillance of injectable contraceptive: Secondary | ICD-10-CM

## 2020-05-14 MED ORDER — MEDROXYPROGESTERONE ACETATE 150 MG/ML IM SUSP: 150.0000 mg | INTRAMUSCULAR | 0 refills | Status: DC

## 2020-05-14 MED ORDER — LISINOPRIL 40 MG PO TABS: 40.0000 mg | ORAL_TABLET | Freq: Every day | ORAL | 3 refills | Status: DC

## 2020-05-14 MED ORDER — MEDROXYPROGESTERONE ACETATE 150 MG/ML IM SUSY
150.0000 mg | PREFILLED_SYRINGE | Freq: Once | INTRAMUSCULAR | Status: AC
  Administered 2020-05-14: 150 mg via INTRAMUSCULAR

## 2020-05-14 MED ORDER — GLUCOSE BLOOD VI STRP: ORAL_STRIP | 5 refills | Status: AC

## 2020-05-14 MED ORDER — HYDROCHLOROTHIAZIDE 25 MG PO TABS: 25.0000 mg | ORAL_TABLET | Freq: Every day | ORAL | 0 refills | Status: DC

## 2020-05-14 NOTE — Progress Notes (Signed)
    SUBJECTIVE:   CHIEF COMPLAINT / HPI:   Darlene Bennett is a 44 year old female who presents today for depo follow up.  Her younger daughter was present at the visit today.  Depo  Patient has been using Depo over the last 7 years. Is currently sexually active with her husband and is tolerating the Depo well without side effects. Denies weight loss, mood disturbance with taking it. Does not experience irregular bleeding/spotting will have menstrual cycles when on the Depo.  She understands the risks and benefits of Depo injection and she is happy to have her Depo today.  She is open to trying different forms birth control such as IUD/Nexplanon given that she has been on Depo for a long time understands the risks of prolonged Depo use.  Hypertension Takes lisinopril 40 mg and HCTZ 12.5 mg once daily. Tolerating both medications well without side effects.  Blood pressures earlier earlier this week in clinic were 292K systolic and plan was to increase HCTZ next month if she remains hypertensive.   PERTINENT  PMH / PSH: Type 2 diabetes, hypertension, peripheral edema  OBJECTIVE:   BP (!) 150/100   Pulse 80   Ht 6' (1.829 m)   Wt 282 lb (127.9 kg)   BMI 38.25 kg/m    General: Alert, no acute distress, pleasant Cardio: Warm and well-perfused   Pulm: No respiratory distress Neuro: Cranial nerves grossly intact  ASSESSMENT/PLAN:   Essential hypertension Bp still elevated today to 150/100. Given that patient has had 2 hypertensive readings in the last 5 days in clinic I [place] to 25mg  daily. Will follow up BP at next visit which is already scheduled for July.  Contraceptive management Upreg negative. Pt had depo injection today and has been on depo since the birth of her daughter >7 years ago. Provided risks and benefits of depo and recommended that patient should consider other forms of birth control such as IUD, nexplanon, OCP etc soon given long term risks of depo. Pt was  happy with this plan and we will discuss this further at our appointment in July.     Lattie Haw, MD [place]

## 2020-05-14 NOTE — Assessment & Plan Note (Addendum)
Upreg negative. Pt had depo injection today and has been on depo since the birth of her daughter >7 years ago. Provided risks and benefits of depo and recommended that patient should consider other forms of birth control such as IUD, nexplanon, OCP etc soon given long term risks of depo. Pt was happy with this plan and we will discuss this further at our appointment in July.

## 2020-05-14 NOTE — Progress Notes (Signed)
Depo given in [place] today.  Site unremarkable & patient tolerated injection.    Next injection due 07/30/20 - 08/13/20.  Reminder card given.    Christen Bame, CMA

## 2020-05-14 NOTE — Assessment & Plan Note (Signed)
Bp still elevated today to 150/100. Given that patient has had 2 hypertensive readings in the last 5 days in clinic I [place] to 25mg  daily. Will follow up BP at next visit which is already scheduled for July.

## 2020-05-14 NOTE — Patient Instructions (Signed)
Lovely to see you today! We increased your fluid tablet to 25mg  once daily. We also gave your your depo injection. Please see below other forms of birth control which we can consider next time. I will see you in 1 month for your weight loss appointment. We will check your BP then too and follow up on your thoughts about birth control options  Best wishes,  Dr Posey Pronto    Medroxyprogesterone injection [Contraceptive] What is this medicine? MEDROXYPROGESTERONE (me DROX ee proe JES te rone) contraceptive injections prevent pregnancy. They provide effective birth control for 3 months. Depo-subQ Provera 104 is also used for treating pain related to endometriosis. This medicine may be used for other purposes; ask your health care provider or pharmacist if you have questions. COMMON BRAND NAME(S): Depo-Provera, Depo-subQ Provera 104 What should I tell my health care provider before I take this medicine? They need to know if you have any of these conditions:  frequently drink alcohol  asthma  blood vessel disease or a history of a blood clot in the lungs or legs  bone disease such as osteoporosis  breast cancer  diabetes  eating disorder (anorexia nervosa or bulimia)  high blood pressure  HIV infection or AIDS  kidney disease  liver disease  mental depression  migraine  seizures (convulsions)  stroke  tobacco smoker  vaginal bleeding  an unusual or allergic reaction to medroxyprogesterone, other hormones, medicines, foods, dyes, or preservatives  pregnant or trying to get pregnant  breast-feeding How should I use this medicine? Depo-Provera Contraceptive injection is given into a muscle. Depo-subQ Provera 104 injection is given under the skin. These injections are given by a health care professional. You must not be pregnant before getting an injection. The injection is usually given during the first 5 days after the start of a menstrual period or 6 weeks after delivery of  a baby. Talk to your pediatrician regarding the use of this medicine in children. Special care may be needed. These injections have been used in female children who have started having menstrual periods. Overdosage: If you think you have taken too much of this medicine contact a poison control center or emergency room at once. NOTE: This medicine is only for you. Do not share this medicine with others. What if I miss a dose? Try not to miss a dose. You must get an injection once every 3 months to maintain birth control. If you cannot keep an appointment, call and reschedule it. If you wait longer than 13 weeks between Depo-Provera contraceptive injections or longer than 14 weeks between Depo-subQ Provera 104 injections, you could get pregnant. Use another method for birth control if you miss your appointment. You may also need a pregnancy test before receiving another injection. What may interact with this medicine? Do not take this medicine with any of the following medications:  bosentan This medicine may also interact with the following medications:  aminoglutethimide  antibiotics or medicines for infections, especially rifampin, rifabutin, rifapentine, and griseofulvin  aprepitant  barbiturate medicines such as phenobarbital or primidone  bexarotene  carbamazepine  medicines for seizures like ethotoin, felbamate, oxcarbazepine, phenytoin, topiramate  modafinil  St. John's wort This list may not describe all possible interactions. Give your health care provider a list of all the medicines, herbs, non-prescription drugs, or dietary supplements you use. Also tell them if you smoke, drink alcohol, or use illegal drugs. Some items may interact with your medicine.  What should I watch for while using this  medicine? This drug does not protect you against HIV infection (AIDS) or other sexually transmitted diseases. Use of this product may cause you to lose calcium from your bones. Loss  of calcium may cause weak bones (osteoporosis). Only use this product for more than 2 years if other forms of birth control are not right for you. The longer you use this product for birth control the more likely you will be at risk for weak bones. Ask your health care professional how you can keep strong bones. You may have a change in bleeding pattern or irregular periods. Many females stop having periods while taking this drug. If you have received your injections on time, your chance of being pregnant is very low. If you think you may be pregnant, see your health care professional as soon as possible. Tell your health care professional if you want to get pregnant within the next year. The effect of this medicine may last a long time after you get your last injection. What side effects may I notice from receiving this medicine? Side effects that you should report to your doctor or health care professional as soon as possible:  allergic reactions like skin rash, itching or hives, swelling of the face, lips, or tongue  breast tenderness or discharge  breathing problems  changes in vision  depression  feeling faint or lightheaded, falls  fever  pain in the abdomen, chest, groin, or leg  problems with balance, talking, walking  unusually weak or tired  yellowing of the eyes or skin Side effects that usually do not require medical attention (report to your doctor or health care professional if they continue or are bothersome):  acne  fluid retention and swelling  headache  irregular periods, spotting, or absent periods  temporary pain, itching, or skin reaction at site where injected  weight gain This list may not describe all possible side effects. Call your doctor for medical advice about side effects. You may report side effects to FDA at 1-800-FDA-1088. Where should I keep my medicine? This does not apply. The injection will be given to you by a health care  professional. NOTE: This sheet is a summary. It may not cover all possible information. If you have questions about this medicine, talk to your doctor, pharmacist, or health care provider.  2020 Elsevier/Gold Standard (2008-12-12 18:37:56)    Levonorgestrel intrauterine device (IUD) What is this medicine? LEVONORGESTREL IUD (LEE voe nor jes trel) is a contraceptive (birth control) device. The device is placed inside the uterus by a healthcare professional. It is used to prevent pregnancy. This device can also be used to treat heavy bleeding that occurs during your period. This medicine may be used for other purposes; ask your health care provider or pharmacist if you have questions. COMMON BRAND NAME(S): Minette [place] What should I tell my health care provider before I take this medicine? They need to know if you have any of these conditions:  abnormal Pap smear  cancer of the breast, uterus, or cervix  diabetes  endometritis  genital or pelvic infection now or in the past  have more than one sexual partner or your partner has more than one partner  heart disease  history of an ectopic or tubal pregnancy  immune system problems  IUD in place  liver disease or tumor  problems with blood clots or take blood-thinners  seizures  use intravenous drugs  uterus of unusual shape  vaginal bleeding that has not been explained  an unusual or allergic reaction to levonorgestrel, other hormones, silicone, or polyethylene, medicines, foods, dyes, or preservatives  pregnant or trying to get pregnant  breast-feeding How should I use this medicine? This device is placed inside the uterus by a health care professional. Talk to your pediatrician regarding the use of this medicine in children. Special care may be needed. Overdosage: If you think you have taken too much of this medicine contact a poison control center or emergency room at once. NOTE: This medicine  is only for you. Do not share this medicine with others. What if I miss a dose? This does not apply. Depending on the brand of device you have inserted, the device will need to be replaced every 3 to 6 years if you wish to continue using this type of birth control. What may interact with this medicine? Do not take this medicine with any of the following medications:  amprenavir  bosentan  fosamprenavir This medicine may also interact with the following medications:  aprepitant  armodafinil  barbiturate medicines for inducing sleep or treating seizures  bexarotene  boceprevir  griseofulvin  medicines to treat seizures like carbamazepine, ethotoin, felbamate, oxcarbazepine, phenytoin, topiramate  modafinil  pioglitazone  rifabutin  rifampin  rifapentine  some medicines to treat HIV infection like atazanavir, efavirenz, indinavir, lopinavir, nelfinavir, tipranavir, ritonavir  St. John's wort  warfarin This list may not describe all possible interactions. Give your health care provider a list of all the medicines, herbs, non-prescription drugs, or dietary supplements you use. Also tell them if you smoke, drink alcohol, or use illegal drugs. Some items may interact with your medicine.  What should I watch for while using this medicine? Visit your doctor or health care professional for regular check ups. See your doctor if you or your partner has sexual contact with others, becomes HIV positive, or gets a sexual transmitted disease. This product does not protect you against HIV infection (AIDS) or other sexually transmitted diseases. You can check the placement of the IUD yourself by reaching up to the top of your vagina with clean fingers to feel the threads. Do not pull on the threads. It is a good habit to check placement after each menstrual period. Call your doctor right away if you feel more of the IUD than just the threads or if you cannot feel the threads at all. The  IUD may come out by itself. You may become pregnant if the device comes out. If you notice that the IUD has come out use a backup birth control method like condoms and call your health care provider. Using tampons will not change the position of the IUD and are okay to use during your period. This IUD can be safely scanned with magnetic resonance imaging (MRI) only under specific conditions. Before you have an MRI, tell your healthcare provider that you have an IUD in place, and which type of IUD you have in place. What side effects may I notice from receiving this medicine? Side effects that you should report to your doctor or health care professional as soon as possible:  allergic reactions like skin rash, itching or hives, swelling of the face, lips, or tongue  fever, flu-like symptoms  genital sores  high blood pressure  no menstrual period for 6 weeks during use  pain, swelling, warmth in the leg  pelvic pain or tenderness  severe or sudden headache  signs of pregnancy stomach crampingEtonogestrel implant What is this medicine? ETONOGESTREL (et oh noe JES  trel) is a contraceptive (birth control) device. It is used to prevent pregnancy. It can be used for up to 3 years. This medicine may be used for other purposes; ask your health care provider or pharmacist if you have questions. COMMON BRAND NAME(S): Implanon, Nexplanon What should I tell my health care provider before I take this medicine? They need to know if you have any of these conditions: abnormal vaginal bleeding blood vessel disease or blood clots breast, cervical, endometrial, ovarian, liver, or uterine cancer diabetes gallbladder disease heart disease or recent heart attack high blood pressure high cholesterol or triglycerides kidney disease liver disease migraine headaches seizures stroke tobacco smoker an unusual or allergic reaction to etonogestrel, anesthetics or antiseptics, other medicines, foods,  dyes, or preservatives pregnant or trying to get pregnant breast-feeding How should I use this medicine? This device is inserted just under the skin on the inner side of your upper arm by a health care professional. Talk to your pediatrician regarding the use of this medicine in children. Special care may be needed. Overdosage: If you think you have taken too much of this medicine contact a poison control center or emergency room at once. NOTE: This medicine is only for you. Do not share this medicine with others. What if I miss a dose? This does not apply. What may interact with this medicine? Do not take this medicine with any of the following medications: amprenavir fosamprenavir This medicine may also interact with the following medications: acitretin aprepitant armodafinil bexarotene bosentan carbamazepine certain medicines for fungal infections like fluconazole, ketoconazole, itraconazole and voriconazole certain medicines to treat hepatitis, HIV or AIDS cyclosporine felbamate griseofulvin lamotrigine modafinil oxcarbazepine phenobarbital phenytoin primidone rifabutin rifampin rifapentine St. John's wort topiramate This list may not describe all possible interactions. Give your health care provider a list of all the medicines, herbs, non-prescription drugs, or dietary supplements you use. Also tell them if you smoke, drink alcohol, or use illegal drugs. Some items may interact with your medicine. What should I watch for while using this medicine? This product does not protect you against HIV infection (AIDS) or other sexually transmitted diseases. You should be able to feel the implant by pressing your fingertips over the skin where it was inserted. Contact your doctor if you cannot feel the implant, and use a non-hormonal birth control method (such as condoms) until your doctor confirms that the implant is in place. Contact your doctor if you think that the implant may  have broken or become bent while in your arm. You will receive a user card from your health care provider after the implant is inserted. The card is a record of the location of the implant in your upper arm and when it should be removed. Keep this card with your health records. What side effects may I notice from receiving this medicine? Side effects that you should report to your doctor or health care professional as soon as possible: allergic reactions like skin rash, itching or hives, swelling of the face, lips, or tongue breast lumps, breast tissue changes, or discharge breathing problems changes in emotions or moods coughing up blood if you feel that the implant may have broken or bent while in your arm high blood pressure pain, irritation, swelling, or bruising at the insertion site scar at site of insertion signs of infection at the insertion site such as fever, and skin redness, pain or discharge signs and symptoms of a blood clot such as breathing problems; changes in vision; chest  pain; severe, sudden headache; pain, swelling, warmth in the leg; trouble speaking; sudden numbness or weakness of the face, arm or leg signs and symptoms of liver injury like dark yellow or brown urine; general ill feeling or flu-like symptoms; light-colored stools; loss of appetite; nausea; right upper belly pain; unusually weak or tired; yellowing of the eyes or skin unusual vaginal bleeding, discharge Side effects that usually do not require medical attention (report to your doctor or health care professional if they continue or are bothersome): acne breast pain or tenderness headache irregular menstrual bleeding nausea This list may not describe all possible side effects. Call your doctor for medical advice about side effects. You may report side effects to FDA at 1-800-FDA-1088. Where should I keep my medicine? This drug is given in a hospital or clinic and will not be stored at home. NOTE: This  sheet is a summary. It may not cover all possible information. If you have questions about this medicine, talk to your doctor, pharmacist, or health care provider.  2020 Elsevier/Gold Standard (2019-09-03 11:33:04)    sudden shortness of breath  trouble with balance, talking, or walking  unusual vaginal bleeding, discharge  yellowing of the eyes or skin Side effects that usually do not require medical attention (report to your doctor or health care professional if they continue or are bothersome):  acne  breast pain  change in sex drive or performance  changes in weight  cramping, dizziness, or faintness while the device is being inserted  headache  irregular menstrual bleeding within first 3 to 6 months of use  nausea This list may not describe all possible side effects. Call your doctor for medical advice about side effects. You may report side effects to FDA at 1-800-FDA-1088. Where should I keep my medicine? This does not apply. NOTE: This sheet is a summary. It may not cover all possible information. If you have questions about this medicine, talk to your doctor, pharmacist, or health care provider.  2020 Elsevier/Gold Standard (2018-10-02 13:22:01)

## 2020-06-08 NOTE — Progress Notes (Deleted)
° ° °  SUBJECTIVE:   CHIEF COMPLAINT / HPI:   Darlene Bennett is a 44 yr old female who presents today for a weight loss follow up   Weight loss Seen last month for weight loss. Diet and exercise counseling provided **. Today weighs **. Weighed 281 lb at previous visit  HTN BP 160/70 at previous visit. Increased dose of HTCZ to 25mg  once daily. Side effects **. Edema, chest pain, palpitations, dizziness,  PERTINENT  PMH / PSH: ***  OBJECTIVE:   There were no vitals taken for this visit.  ***  ASSESSMENT/PLAN:   No problem-specific Assessment & Plan notes found for this encounter.     Lattie Haw, MD [place]

## 2020-06-09 ENCOUNTER — Ambulatory Visit: Payer: Medicaid Other | Admitting: Family Medicine

## 2020-06-19 ENCOUNTER — Encounter: Payer: Self-pay | Admitting: Family Medicine

## 2020-06-19 ENCOUNTER — Ambulatory Visit (INDEPENDENT_AMBULATORY_CARE_PROVIDER_SITE_OTHER): Payer: Medicaid Other | Admitting: Family Medicine

## 2020-06-19 ENCOUNTER — Other Ambulatory Visit: Payer: Self-pay

## 2020-06-19 DIAGNOSIS — I10 Essential (primary) hypertension: Secondary | ICD-10-CM

## 2020-06-19 NOTE — Progress Notes (Signed)
    SUBJECTIVE:   CHIEF COMPLAINT / HPI:   Darlene Bennett is a 44 yr old female who presents today for weight check   Weight loss Pt has lost 1lb since last visit. She has been walking daily since I last saw her in clinic. She has also made some diet changes. 24 hr food recall for a typical day: cheerios with milk, oriental chicken salad, 2 slices of [place] and cheese and water. Denies sodas or sugary foods. She does feel satiated with her food intake. She is motivated to lose more weight.  HTN Takes Lisinopril 40 mg and HCTZ 25 mg once daily. Tolerating well without side effects. Denies chest pain, palpitations, dizziness or peripheral edema.  PERTINENT  PMH / PSH: Obesity, HTN, Diabetes  OBJECTIVE:   BP (!) 145/95   Pulse 80   Ht 6' (1.829 m)   Wt 281 lb 3.2 oz (127.6 kg)   SpO2 99%   BMI 38.14 kg/m    General: Alert and cooperative and appears to be in no acute distress Cardio: well perfused  Pulm: Normal respiratory effort Neuro: Cranial nerves grossly intact,  ASSESSMENT/PLAN:   Morbid obesity ([place]) Congratulated patient on weight loss and on her efforts to change her diet and lifestyle. She is interested and motivated in joining the healthy weight and wellness clinic. I have made this referral but also recommended that patient calls the clinic on Monday to enroll as this may be faster.   Essential hypertension BP 145/95, improved since previous visit due after HCTZ increase. Booked patient for nurse visit next week for BP check. If still elevated I will add amlodipine 5mg . Pt will also have routine BMP next week too.      Lattie Haw, MD [place]

## 2020-06-19 NOTE — Assessment & Plan Note (Signed)
Congratulated patient on weight loss and on her efforts to change her diet and lifestyle. She is interested and motivated in joining the healthy weight and wellness clinic. I have made this referral but also recommended that patient calls the clinic on Monday to enroll as this may be faster.

## 2020-06-19 NOTE — Assessment & Plan Note (Signed)
BP 145/95, improved since previous visit due after HCTZ increase. Booked patient for nurse visit next week for BP check. If still elevated I will add amlodipine 5mg . Pt will also have routine BMP next week too.

## 2020-06-19 NOTE — Patient Instructions (Addendum)
Great to see you today! Congratulations on the 1lb of weight loss. Keep up the great work. I have referred you to [facility]. Please call them as it might be easier for you to get an appointment.  [facility] Tel: [phone] Address: [address], [place], [place] [address]  Please go to the nursing visit next week for a BP check. If your BP is still high next week we can increase the dose of HCTZ.  Please see the information below on the IUD for birth control.   Best wishes  Dr Posey Pronto   Intrauterine Device Information An intrauterine device (IUD) is a medical device that is inserted in the uterus to prevent pregnancy. It is a small, T-shaped device that has one or two nylon strings hanging down from it. The strings hang out of the lower part of the uterus (cervix) to allow for future IUD removal. There are two types of IUDs available:  Hormone IUD. This type of IUD is made of plastic and contains the hormone progestin (synthetic progesterone). A hormone IUD may last 3-5 years.  Copper IUD. This type of IUD has copper wire wrapped around it. A copper IUD may last up to 10 years. How is an IUD inserted? An IUD is inserted through the vagina and placed into the uterus with a minor medical procedure. The exact procedure for IUD insertion may vary among health care providers and hospitals. How does an IUD work? Synthetic progesterone in a hormonal IUD prevents pregnancy by:  Thickening cervical mucus to prevent sperm from entering the uterus.  Thinning the uterine lining to prevent a fertilized egg from being implanted there. Copper in a copper IUD prevents pregnancy by making the uterus and fallopian tubes produce a fluid that kills sperm. What are the advantages of an IUD? Advantages of either type of IUD  It is highly effective in preventing pregnancy.  It is reversible. You can become pregnant shortly after the IUD is removed.  It is  low-maintenance and can stay in place for a long time.  There are no estrogen-related side effects.  It can be used when breastfeeding.  It is not associated with weight gain.  It can be inserted right after childbirth, an abortion, or a miscarriage. Advantages of a hormone IUD  If it is inserted within 7 days of your period starting, it works right after it is inserted. If the hormone IUD is inserted at any other time in your cycle, you will need to use a backup method of birth control for 7 days after insertion.  It can make menstrual periods lighter.  It can reduce menstrual cramping.  It can be used for 3-5 years. Advantages of a copper IUD  It works right after it is inserted.  It can be used as a form of emergency birth control if it is inserted within 5 days after having unprotected sex.  It does not interfere with your body's natural hormones.  It can be used for 10 years. What are the disadvantages of an IUD?  An IUD may cause irregular menstrual bleeding for a period of time after insertion.  You may have pain during insertion and have cramping and vaginal bleeding after insertion.  An IUD may cut the uterus (uterine perforation) when it is inserted. This is rare.  An IUD may cause pelvic inflammatory disease (PID), which is an infection in the uterus and fallopian tubes. This is rare, and it usually happens  during the first 20 days after the IUD is inserted.  A copper IUD can make your menstrual flow heavier and more painful. How is an IUD removed?  You will lie on your back with your knees bent and your feet in footrests (stirrups).  A device will be inserted into your vagina to spread apart the vaginal walls (speculum). This will allow your health care provider to see the strings attached to the IUD.  Your health care provider will use a small instrument (forceps) to grasp the IUD strings and pull firmly until the IUD is removed. You may have some discomfort  when the IUD is removed. Your health care provider may recommend taking over-the-counter pain relievers, such as ibuprofen, before the procedure. You may also have minor spotting for a few days after the procedure. The exact procedure for IUD removal may vary among health care providers and hospitals. Is the IUD right for me? Your health care provider will make sure you are a good candidate for an IUD and will discuss the advantages, disadvantages, and possible side effects with you. Summary  An intrauterine device (IUD) is a medical device that is inserted in the uterus to prevent pregnancy. It is a small, T-shaped device that has one or two nylon strings hanging down from it.  A hormone IUD contains the hormone progestin (synthetic progesterone). A copper IUD has copper wire wrapped around it.  Synthetic progesterone in a hormone IUD prevents pregnancy by thickening cervical mucus and thinning the walls of the uterus. Copper in a copper IUD prevents pregnancy by making the uterus and fallopian tubes produce a fluid that kills sperm.  A hormone IUD can be left in place for 3-5 years. A copper IUD can be left in place for up to 10 years.  An IUD is inserted and removed by a health care provider. You may feel some pain during insertion and removal. Your health care provider may recommend taking over-the-counter pain medicine, such as ibuprofen, before an IUD procedure. This information is not intended to replace advice given to you by your health care provider. Make sure you discuss any questions you have with your health care provider. Document Revised: 11/03/2017 Document Reviewed: 12/20/2016 Elsevier Patient Education  [place].

## 2020-06-25 ENCOUNTER — Other Ambulatory Visit: Payer: Self-pay

## 2020-06-25 ENCOUNTER — Ambulatory Visit (INDEPENDENT_AMBULATORY_CARE_PROVIDER_SITE_OTHER): Payer: Medicaid Other

## 2020-06-25 VITALS — BP 136/82 | HR 81

## 2020-06-25 DIAGNOSIS — Z0130 Encounter for examination of blood pressure without abnormal findings: Secondary | ICD-10-CM

## 2020-06-25 DIAGNOSIS — I10 Essential (primary) hypertension: Secondary | ICD-10-CM

## 2020-06-25 MED ORDER — HYDROCHLOROTHIAZIDE 25 MG PO TABS: 25.0000 mg | ORAL_TABLET | Freq: Every day | ORAL | 0 refills | Status: DC

## 2020-06-25 NOTE — Progress Notes (Addendum)
Patient here today for BP check.      Last BP was on 06/19/2020 and was 145/95.  BP today is 136/82 with a pulse of 81.    Checked BP in right arm with large adult cuff.    Symptoms present: none.   Patient last took HCTZ yesterday and lisinopril last night. Patient reports being out of HCTZ, will send rx refill to PCP. Spoke with PCP via phone. Per PCP, patient's blood pressure needs no additional medication. Patient also reports that she contacted the [facility], however she is unable to afford initial co-payment of $100. Patient walked to lab for BMP. Advised patient to follow up in 3 months or earlier if needed.        Talbot Grumbling, RN

## 2020-06-26 ENCOUNTER — Telehealth: Payer: Self-pay | Admitting: Family Medicine

## 2020-06-26 LAB — BASIC METABOLIC PANEL
BUN/Creatinine Ratio: 14 (ref 9–23)
BUN: 17 mg/dL (ref 6–24)
CO2: 18 mmol/L — ABNORMAL LOW (ref 20–29)
Calcium: 9 mg/dL (ref 8.7–10.2)
Chloride: 103 mmol/L (ref 96–106)
Creatinine, Ser: 1.24 mg/dL — ABNORMAL HIGH (ref 0.57–1.00)
GFR calc Af Amer: 61 mL/min/{1.73_m2} (ref >59)
GFR calc non Af Amer: 53 mL/min/{1.73_m2} — ABNORMAL LOW (ref >59)
Glucose: 399 mg/dL — ABNORMAL HIGH (ref 65–99)
Potassium: 3.8 mmol/L (ref 3.5–5.2)
Sodium: 136 mmol/L (ref 134–144)

## 2020-06-26 NOTE — Telephone Encounter (Signed)
Called patient to inform her of her lab results. Her Creatinine was slightly elevated compared to last and glucose was also high indicating dehydration. Recommended that she stays adequately hydrated in the hot weather and we can repeat BMP at next visit.

## 2020-07-04 ENCOUNTER — Other Ambulatory Visit: Payer: Self-pay

## 2020-07-04 ENCOUNTER — Emergency Department (HOSPITAL_COMMUNITY)
Admission: EM | Admit: 2020-07-04 | Discharge: 2020-07-04 | Disposition: A | Discharge status: Home / Self Care | Payer: Medicaid Other | Attending: Emergency Medicine | Admitting: Emergency Medicine

## 2020-07-04 DIAGNOSIS — I10 Essential (primary) hypertension: Secondary | ICD-10-CM | POA: Insufficient documentation

## 2020-07-04 DIAGNOSIS — B373 Candidiasis of vulva and vagina: Secondary | ICD-10-CM

## 2020-07-04 DIAGNOSIS — N898 Other specified noninflammatory disorders of vagina: Secondary | ICD-10-CM | POA: Insufficient documentation

## 2020-07-04 DIAGNOSIS — Z79899 Other long term (current) drug therapy: Secondary | ICD-10-CM | POA: Insufficient documentation

## 2020-07-04 DIAGNOSIS — Z7982 Long term (current) use of aspirin: Secondary | ICD-10-CM | POA: Insufficient documentation

## 2020-07-04 DIAGNOSIS — R739 Hyperglycemia, unspecified: Secondary | ICD-10-CM | POA: Diagnosis present

## 2020-07-04 DIAGNOSIS — E119 Type 2 diabetes mellitus without complications: Secondary | ICD-10-CM | POA: Insufficient documentation

## 2020-07-04 DIAGNOSIS — B3731 Acute candidiasis of vulva and vagina: Secondary | ICD-10-CM

## 2020-07-04 DIAGNOSIS — F17210 Nicotine dependence, cigarettes, uncomplicated: Secondary | ICD-10-CM | POA: Diagnosis not present

## 2020-07-04 LAB — BASIC METABOLIC PANEL
Anion gap: 12 (ref 5–15)
BUN: 24 mg/dL — ABNORMAL HIGH (ref 6–20)
CO2: 20 mmol/L — ABNORMAL LOW (ref 22–32)
Calcium: 9.9 mg/dL (ref 8.9–10.3)
Chloride: 99 mmol/L (ref 98–111)
Creatinine, Ser: 1.51 mg/dL — ABNORMAL HIGH (ref 0.44–1.00)
GFR calc Af Amer: 48 mL/min — ABNORMAL LOW (ref >60)
GFR calc non Af Amer: 42 mL/min — ABNORMAL LOW (ref >60)
Glucose, Bld: 401 mg/dL — ABNORMAL HIGH (ref 70–99)
Potassium: 3.7 mmol/L (ref 3.5–5.1)
Sodium: 131 mmol/L — ABNORMAL LOW (ref 135–145)

## 2020-07-04 LAB — CBC WITH DIFFERENTIAL/PLATELET
Abs Immature Granulocytes: 0.02 10*3/uL (ref 0.00–0.07)
Basophils Absolute: 0 10*3/uL (ref 0.0–0.1)
Basophils Relative: 0 %
Eosinophils Absolute: 0 10*3/uL (ref 0.0–0.5)
Eosinophils Relative: 0 %
HCT: 46.1 % — ABNORMAL HIGH (ref 36.0–46.0)
Hemoglobin: 15.8 g/dL — ABNORMAL HIGH (ref 12.0–15.0)
Immature Granulocytes: 0 %
Lymphocytes Relative: 42 %
Lymphs Abs: 3 10*3/uL (ref 0.7–4.0)
MCH: 29.3 pg (ref 26.0–34.0)
MCHC: 34.3 g/dL (ref 30.0–36.0)
MCV: 85.5 fL (ref 80.0–100.0)
Monocytes Absolute: 0.5 10*3/uL (ref 0.1–1.0)
Monocytes Relative: 7 %
Neutro Abs: 3.6 10*3/uL (ref 1.7–7.7)
Neutrophils Relative %: 51 %
Platelets: 341 10*3/uL (ref 150–400)
RBC: 5.39 MIL/uL — ABNORMAL HIGH (ref 3.87–5.11)
RDW: 12 % (ref 11.5–15.5)
WBC: 7.2 10*3/uL (ref 4.0–10.5)
nRBC: 0 % (ref 0.0–0.2)

## 2020-07-04 LAB — CBG MONITORING, ED: Glucose-Capillary: 479 mg/dL — ABNORMAL HIGH (ref 70–99)

## 2020-07-04 MED ORDER — INSULIN ASPART 100 UNIT/ML ~~LOC~~ SOLN
8.0000 [IU] | Freq: Once | SUBCUTANEOUS | Status: AC
  Administered 2020-07-04: 8 [IU] via SUBCUTANEOUS
  Filled 2020-07-04: qty 0.08

## 2020-07-04 MED ORDER — FLUCONAZOLE 150 MG PO TABS: 150.0000 mg | ORAL_TABLET | ORAL | 0 refills | Status: DC | PRN

## 2020-07-04 MED ORDER — INSULIN ASPART PROT & ASPART (70-30 MIX) 100 UNIT/ML ~~LOC~~ SUSP: 8.0000 [IU] | Freq: Once | SUBCUTANEOUS | Status: DC

## 2020-07-04 MED ORDER — FLUCONAZOLE 150 MG PO TABS
150.0000 mg | ORAL_TABLET | Freq: Once | ORAL | Status: AC
  Administered 2020-07-04: 150 mg via ORAL
  Filled 2020-07-04: qty 1

## 2020-07-04 NOTE — ED Provider Notes (Signed)
[facility]-EMERGENCY DEPT Provider Note   CSN: [id number] Arrival date & time: 07/04/20  1422     History Chief Complaint  Patient presents with  . Hyperglycemia    Darlene Bennett is a 43 y.o. female.  HPI     44 y/o F comes in with cc of elevated blood sugar. Pt has hx of HTN, DM. Pt reports that she started noticing white discharge y'day with irritation. She typically gets yeast infection when sugar is high. She suspects that her sugar is high due to her 2nd void shot.  Pt denies nausea, emesis, fevers, chills, chest pains, shortness of breath, headaches, abdominal pain, uti like symptoms.   Past Medical History:  Diagnosis Date  . Gestational diabetes    insulin  . Gestational diabetes mellitus, antepartum 02/14/2013  . History of gestational diabetes   . Hypertension    preeclampsia with previous pregnancy  . Scalp cyst     Patient Active Problem List   Diagnosis Date Noted  . Morbid obesity (HCC) 05/11/2020  . Chronic back pain 10/31/2018  . Right sciatic nerve pain 10/31/2018  . Routine cervical smear 04/23/2018  . Epidermoid cyst 08/04/2016  . Preventative health care 02/20/2016  . Contraceptive management 05/25/2015  . Type 2 diabetes mellitus (HCC) 04/24/2015  . Microalbuminuria due to type 2 diabetes mellitus (HCC) 03/25/2015  . Hyperlipemia 03/09/2015  . Tobacco use disorder 03/09/2015  . Essential hypertension 11/15/2012    Past Surgical History:  Procedure Laterality Date  . CYST EXCISION N/A 09/15/2016   Procedure: EXCISION POSTERIOR SCALP CYST;  Surgeon: Berna Bue, MD;  Location: [facility]S;  Service: General;  Laterality: N/A;  . TUBAL LIGATION     2007     OB History    Gravida  4   Para  4   Term  3   Preterm  1   AB  0   Living  4     SAB  0   TAB  0   Ectopic  0   Multiple  0   Live Births  4           Family History  Problem Relation Age of Onset  . Heart disease Mother   .  Hypertension Mother   . Diabetes Mother   . Hypertension Father   . Diabetes Father     Social History   Tobacco Use  . Smoking status: Current Every Day Smoker    Packs/day: 0.25    Years: 22.00    Pack years: 5.50    Types: Cigarettes  . Smokeless tobacco: Never Used  . Tobacco comment: working on it now; has cut back from 0.5  Substance Use Topics  . Alcohol use: No    Alcohol/week: 0.0 standard drinks  . Drug use: No    Home Medications Prior to Admission medications   Medication Sig Start Date End Date Taking? Authorizing Provider  ACCU-CHEK FASTCLIX LANCETS MISC 1 each by Does not apply route 4 (four) times daily -  before meals and at bedtime. Check sugar 6 x daily 05/11/17   Pincus Large, DO  aspirin EC 81 MG tablet Take 1 tablet (81 mg total) by mouth daily. 06/20/19   Dana Allan, MD  atorvastatin (LIPITOR) 40 MG tablet Take 1 tablet (40 mg total) by mouth daily. 11/19/19   Towanda Octave, MD  Blood Glucose Monitoring Suppl (ACCU-CHEK NANO SMARTVIEW) W/DEVICE KIT 1 Device by Does not apply route 4 (four)  times daily -  before meals and at bedtime. 03/09/15   Katheren Shams, DO  Empagliflozin-metFORMIN HCl (SYNJARDY) 04-999 MG TABS Take 1 tablet by mouth 2 (two) times daily. 12/24/19   Lattie Haw, MD  glucose blood test strip Use to check blood sugar once daily or as directed. 05/14/20   Lattie Haw, MD  hydrochlorothiazide (HYDRODIURIL) 25 MG tablet Take 1 tablet (25 mg total) by mouth daily. 06/25/20 07/25/20  Lattie Haw, MD  lisinopril (ZESTRIL) 40 MG tablet Take 1 tablet (40 mg total) by mouth at bedtime. 05/14/20   Lattie Haw, MD  medroxyPROGESTERone (DEPO-PROVERA) 150 MG/ML injection Inject 1 mL (150 mg total) into the muscle every 3 (three) months. 05/14/20   Lattie Haw, MD  naproxen (NAPROSYN) 500 MG tablet Take 1 tablet (500 mg total) by mouth 2 (two) times daily. 10/10/19   Petrucelli, Glynda Jaeger, PA-C    Allergies    Patient has no known  allergies.  Review of Systems   Review of Systems  Constitutional: Positive for activity change.  Genitourinary: Positive for vaginal discharge.  All other systems reviewed and are negative.   Physical Exam Updated Vital Signs BP (!) 152/109 (BP Location: Left Arm)   Pulse 97   Temp 98.8 F (37.1 C) (Oral)   Resp 18   Ht 6' (1.829 m)   Wt (!) 130.2 kg   SpO2 99%   BMI 38.92 kg/m   Physical Exam Vitals and nursing note reviewed.  Constitutional:      Appearance: She is well-developed.  HENT:     Head: Normocephalic and atraumatic.  Eyes:     Pupils: Pupils are equal, round, and reactive to light.  Cardiovascular:     Rate and Rhythm: Normal rate and regular rhythm.     Heart sounds: Normal heart sounds. No murmur heard.   Pulmonary:     Effort: Pulmonary effort is normal. No respiratory distress.  Abdominal:     General: There is no distension.     Palpations: Abdomen is soft.     Tenderness: There is no abdominal tenderness. There is no guarding or rebound.  Musculoskeletal:     Cervical back: Neck supple.  Skin:    General: Skin is warm and dry.  Neurological:     Mental Status: She is alert and oriented to person, place, and time.     ED Results / Procedures / Treatments   Labs (all labs ordered are listed, but only abnormal results are displayed) Labs Reviewed  CBG MONITORING, ED - Abnormal; Notable for the following components:      Result Value   Glucose-Capillary 479 (*)    All other components within normal limits  BASIC METABOLIC PANEL  CBC WITH DIFFERENTIAL/PLATELET  I-STAT BETA HCG BLOOD, ED ([facility], [facility], [facility] ONLY)    EKG None  Radiology No results found.  Procedures Procedures (including critical care time)  Medications Ordered in ED Medications  fluconazole (DIFLUCAN) tablet 150 mg (has no administration in time range)    ED Course  I have reviewed the triage vital signs and the nursing notes.  Pertinent labs & imaging results that  were available during my care of the patient were reviewed by me and considered in my medical decision making (see chart for details).    MDM Rules/Calculators/A&P                         Pt with cc of elevated blood sugar.  No complications from it. Will treat her candida infection.   Final Clinical Impression(s) / ED Diagnoses Final diagnoses:  Vaginal yeast infection  Hyperglycemia    Rx / DC Orders ED Discharge Orders    None       Varney Biles, MD 07/04/20 1914

## 2020-07-04 NOTE — Discharge Instructions (Addendum)
Take the medication prescribed for yeast infection. Hydrate well and keep taking  your medicine as prescribed. Make sure to check your blood glucose the next few days and see your primary doctor if the sugars remain high.

## 2020-07-04 NOTE — ED Triage Notes (Signed)
Per patient, her blood sugar is up. Patient states she knows this because she has a yeast infection. Patient denies checking cbg. Reports she takes antidiabetic medication. Took medication today. Patient CBG in triage 479. Denies any other symptoms such as nausea/vomiting/dizziness.

## 2020-07-06 LAB — I-STAT BETA HCG BLOOD, ED (MC, WL, AP ONLY): I-stat hCG, quantitative: <5 m[IU]/mL (ref <5)

## 2020-07-08 ENCOUNTER — Emergency Department (HOSPITAL_COMMUNITY)
Admission: EM | Admit: 2020-07-08 | Discharge: 2020-07-09 | Disposition: A | Discharge status: Home / Self Care | Payer: Medicaid Other | Attending: Emergency Medicine | Admitting: Emergency Medicine

## 2020-07-08 ENCOUNTER — Other Ambulatory Visit: Payer: Self-pay

## 2020-07-08 DIAGNOSIS — N6320 Unspecified lump in the left breast, unspecified quadrant: Secondary | ICD-10-CM | POA: Insufficient documentation

## 2020-07-08 DIAGNOSIS — Z5321 Procedure and treatment not carried out due to patient leaving prior to being seen by health care provider: Secondary | ICD-10-CM | POA: Diagnosis not present

## 2020-07-08 NOTE — ED Triage Notes (Signed)
Pt reports small, painful bump on her L nipple x 2 days. Denies drainage.

## 2020-07-09 ENCOUNTER — Encounter: Payer: Self-pay | Admitting: Family Medicine

## 2020-07-09 ENCOUNTER — Ambulatory Visit (INDEPENDENT_AMBULATORY_CARE_PROVIDER_SITE_OTHER): Payer: Medicaid Other | Admitting: Family Medicine

## 2020-07-09 DIAGNOSIS — I10 Essential (primary) hypertension: Secondary | ICD-10-CM | POA: Diagnosis not present

## 2020-07-09 DIAGNOSIS — L089 Local infection of the skin and subcutaneous tissue, unspecified: Secondary | ICD-10-CM

## 2020-07-09 DIAGNOSIS — E119 Type 2 diabetes mellitus without complications: Secondary | ICD-10-CM

## 2020-07-09 DIAGNOSIS — S20122A Blister (nonthermal) of breast, left breast, initial encounter: Secondary | ICD-10-CM | POA: Diagnosis not present

## 2020-07-09 HISTORY — DX: Local infection of the skin and subcutaneous tissue, unspecified: L08.9

## 2020-07-09 MED ORDER — DOXYCYCLINE HYCLATE 100 MG PO TABS: 100.0000 mg | ORAL_TABLET | Freq: Two times a day (BID) | ORAL | 0 refills | Status: DC

## 2020-07-09 NOTE — Patient Instructions (Signed)
It was great meeting you today!  Please take doxycycline 100 mg twice daily and continue to use a warm compress.   Please contact us if you notice any worsening changes such as rash, redness or increase in size.  Thank you for allowing me to be a part of your medical care!

## 2020-07-09 NOTE — Assessment & Plan Note (Signed)
-  Patient's BP is well-controlled at this time, continue HCTZ and lisinopril

## 2020-07-09 NOTE — ED Notes (Signed)
Pt did not respond when called for vitals recheck 

## 2020-07-09 NOTE — Progress Notes (Signed)
    SUBJECTIVE:   CHIEF COMPLAINT / HPI:   Left breast nipple pain Patient presents to the clinic with left breast nipple pain and tenderness that started 2 days ago, states that this has never occurred before. Denies fever, chills, night sweats and fatigue. Denies any nipple discharge. She is not breast feeding as her kids are 30 and 44 years old. Reports that nothing makes it better or worse. Has tried a warm compress and states that it has not helped. Patient does regular breast exams at home, has never had a mammogram before.  PERTINENT  PMH / PSH:   Hypertension Compliant on home meds of HCTZ and lisinopril, denies any associated complications. Denies chest pain and dyspnea. BP in the office today is 110/70.   Type 2 DM Compliant on empagliflozin-metformin, denies any associated complications. Denies any recent hypoglycemic episodes and dizziness. Last A1c was 6.8 in 05/2020.   OBJECTIVE:   BP 110/70   Pulse 91   Ht 6' (1.829 m)   Wt 265 lb 4 oz (120.3 kg)   SpO2 99%   BMI 35.97 kg/m   General: Patient laying down, in no acute distress. Cardio: regular rate and rhythm, no murmurs appreciated Resp: lungs clear to auscultation bilaterally  Abdomen: nontender, active bowel sounds present Ext: distal pulses intact bilaterally, no LE edema noted bilaterally Derm: skin warm and dry, no rashes or erythema noted on the body including on the left breast. Dark brown 3 cm nodule located on the nipple. No associated discharge, rash or erythema noted. Very tender on the nipple, less tender along the areolar surface.  ASSESSMENT/PLAN:   Blister of breast with infection, left, initial encounter -Likely an infectious component present given the size and pain, Doxycycline 100 mg bid for 10 days prescribed  -Continue warm compress -Instructed to return if area does not heal or any rash/erythema develops -Given the acute onset and presence of pain, I have very low suspicion for concern for  cancerous cause, patient does not need a mammogram at this time. Mammogram deferred at regular screening time.   Essential hypertension -Patient's BP is well-controlled at this time, continue HCTZ and lisinopril  Type 2 diabetes mellitus -Continue empagliflozin-metformin -Encouraged patient to monitor blood glucose levels and to monitor for hypoglycemic episodes  -Encouraged healthy diet habits      Deondrea Markos Larae Grooms, DO [place]

## 2020-07-09 NOTE — Assessment & Plan Note (Addendum)
-  Likely an infectious component present given the size and pain, Doxycycline 100 mg bid for 10 days prescribed  -Continue warm compress -Instructed to return if area does not heal or any rash/erythema develops -Given the acute onset and presence of pain, I have very low suspicion for concern for cancerous cause, patient does not need a mammogram at this time. Mammogram deferred at regular screening time.

## 2020-07-09 NOTE — Assessment & Plan Note (Signed)
-  Continue empagliflozin-metformin -Encouraged patient to monitor blood glucose levels and to monitor for hypoglycemic episodes  -Encouraged healthy diet habits

## 2020-07-20 ENCOUNTER — Inpatient Hospital Stay (HOSPITAL_COMMUNITY)
Admission: EM | Admit: 2020-07-20 | Discharge: 2020-07-22 | DRG: 064 | Disposition: A | Discharge status: Home / Self Care | Payer: Medicaid Other | Attending: Family Medicine | Admitting: Family Medicine

## 2020-07-20 ENCOUNTER — Observation Stay (HOSPITAL_COMMUNITY): Payer: Medicaid Other

## 2020-07-20 ENCOUNTER — Emergency Department (HOSPITAL_COMMUNITY): Payer: Medicaid Other

## 2020-07-20 ENCOUNTER — Encounter (HOSPITAL_COMMUNITY): Payer: Self-pay

## 2020-07-20 ENCOUNTER — Other Ambulatory Visit: Payer: Self-pay

## 2020-07-20 DIAGNOSIS — R4701 Aphasia: Secondary | ICD-10-CM | POA: Diagnosis present

## 2020-07-20 DIAGNOSIS — Z8249 Family history of ischemic heart disease and other diseases of the circulatory system: Secondary | ICD-10-CM

## 2020-07-20 DIAGNOSIS — G8929 Other chronic pain: Secondary | ICD-10-CM | POA: Diagnosis present

## 2020-07-20 DIAGNOSIS — B373 Candidiasis of vulva and vagina: Secondary | ICD-10-CM | POA: Diagnosis present

## 2020-07-20 DIAGNOSIS — Z20822 Contact with and (suspected) exposure to covid-19: Secondary | ICD-10-CM | POA: Diagnosis present

## 2020-07-20 DIAGNOSIS — R233 Spontaneous ecchymoses: Secondary | ICD-10-CM | POA: Diagnosis present

## 2020-07-20 DIAGNOSIS — Z7982 Long term (current) use of aspirin: Secondary | ICD-10-CM

## 2020-07-20 DIAGNOSIS — Z8632 Personal history of gestational diabetes: Secondary | ICD-10-CM

## 2020-07-20 DIAGNOSIS — F17200 Nicotine dependence, unspecified, uncomplicated: Secondary | ICD-10-CM | POA: Diagnosis present

## 2020-07-20 DIAGNOSIS — Z7984 Long term (current) use of oral hypoglycemic drugs: Secondary | ICD-10-CM

## 2020-07-20 DIAGNOSIS — F17210 Nicotine dependence, cigarettes, uncomplicated: Secondary | ICD-10-CM | POA: Diagnosis present

## 2020-07-20 DIAGNOSIS — R471 Dysarthria and anarthria: Secondary | ICD-10-CM | POA: Diagnosis present

## 2020-07-20 DIAGNOSIS — N179 Acute kidney failure, unspecified: Secondary | ICD-10-CM | POA: Diagnosis present

## 2020-07-20 DIAGNOSIS — I63411 Cerebral infarction due to embolism of right middle cerebral artery: Principal | ICD-10-CM | POA: Diagnosis present

## 2020-07-20 DIAGNOSIS — G8194 Hemiplegia, unspecified affecting left nondominant side: Secondary | ICD-10-CM | POA: Diagnosis present

## 2020-07-20 DIAGNOSIS — E785 Hyperlipidemia, unspecified: Secondary | ICD-10-CM | POA: Diagnosis present

## 2020-07-20 DIAGNOSIS — Z6838 Body mass index (BMI) 38.0-38.9, adult: Secondary | ICD-10-CM

## 2020-07-20 DIAGNOSIS — Z833 Family history of diabetes mellitus: Secondary | ICD-10-CM

## 2020-07-20 DIAGNOSIS — Z79899 Other long term (current) drug therapy: Secondary | ICD-10-CM

## 2020-07-20 DIAGNOSIS — E1100 Type 2 diabetes mellitus with hyperosmolarity without nonketotic hyperglycemic-hyperosmolar coma (NKHHC): Secondary | ICD-10-CM | POA: Diagnosis present

## 2020-07-20 DIAGNOSIS — I63511 Cerebral infarction due to unspecified occlusion or stenosis of right middle cerebral artery: Secondary | ICD-10-CM

## 2020-07-20 DIAGNOSIS — E876 Hypokalemia: Secondary | ICD-10-CM | POA: Diagnosis not present

## 2020-07-20 DIAGNOSIS — R4781 Slurred speech: Secondary | ICD-10-CM

## 2020-07-20 DIAGNOSIS — R739 Hyperglycemia, unspecified: Secondary | ICD-10-CM

## 2020-07-20 DIAGNOSIS — E1165 Type 2 diabetes mellitus with hyperglycemia: Secondary | ICD-10-CM | POA: Diagnosis present

## 2020-07-20 DIAGNOSIS — R29701 NIHSS score 1: Secondary | ICD-10-CM | POA: Diagnosis present

## 2020-07-20 DIAGNOSIS — I6521 Occlusion and stenosis of right carotid artery: Secondary | ICD-10-CM | POA: Diagnosis present

## 2020-07-20 DIAGNOSIS — I10 Essential (primary) hypertension: Secondary | ICD-10-CM | POA: Diagnosis present

## 2020-07-20 DIAGNOSIS — Z9119 Patient's noncompliance with other medical treatment and regimen: Secondary | ICD-10-CM

## 2020-07-20 DIAGNOSIS — E119 Type 2 diabetes mellitus without complications: Secondary | ICD-10-CM

## 2020-07-20 LAB — DIFFERENTIAL
Abs Immature Granulocytes: 0.02 10*3/uL (ref 0.00–0.07)
Basophils Absolute: 0 10*3/uL (ref 0.0–0.1)
Basophils Relative: 0 %
Eosinophils Absolute: 0 10*3/uL (ref 0.0–0.5)
Eosinophils Relative: 0 %
Immature Granulocytes: 0 %
Lymphocytes Relative: 31 %
Lymphs Abs: 2.2 10*3/uL (ref 0.7–4.0)
Monocytes Absolute: 0.4 10*3/uL (ref 0.1–1.0)
Monocytes Relative: 5 %
Neutro Abs: 4.5 10*3/uL (ref 1.7–7.7)
Neutrophils Relative %: 64 %

## 2020-07-20 LAB — I-STAT CHEM 8, ED
BUN: 12 mg/dL (ref 6–20)
Calcium, Ion: 1.23 mmol/L (ref 1.15–1.40)
Chloride: 97 mmol/L — ABNORMAL LOW (ref 98–111)
Creatinine, Ser: 1.1 mg/dL — ABNORMAL HIGH (ref 0.44–1.00)
Glucose, Bld: 690 mg/dL (ref 70–99)
HCT: 43 % (ref 36.0–46.0)
Hemoglobin: 14.6 g/dL (ref 12.0–15.0)
Potassium: 4 mmol/L (ref 3.5–5.1)
Sodium: 134 mmol/L — ABNORMAL LOW (ref 135–145)
TCO2: 24 mmol/L (ref 22–32)

## 2020-07-20 LAB — URINALYSIS, ROUTINE W REFLEX MICROSCOPIC
Bilirubin Urine: NEGATIVE
Glucose, UA: >=500 mg/dL — AB
Ketones, ur: NEGATIVE mg/dL
Nitrite: NEGATIVE
Protein, ur: NEGATIVE mg/dL
Specific Gravity, Urine: 1.032 — ABNORMAL HIGH (ref 1.005–1.030)
pH: 6 (ref 5.0–8.0)

## 2020-07-20 LAB — BASIC METABOLIC PANEL
Anion gap: 15 (ref 5–15)
Anion gap: 10 (ref 5–15)
Anion gap: 10 (ref 5–15)
BUN: 11 mg/dL (ref 6–20)
BUN: 10 mg/dL (ref 6–20)
BUN: 10 mg/dL (ref 6–20)
CO2: 23 mmol/L (ref 22–32)
CO2: 24 mmol/L (ref 22–32)
CO2: 24 mmol/L (ref 22–32)
Calcium: 9.8 mg/dL (ref 8.9–10.3)
Calcium: 9.4 mg/dL (ref 8.9–10.3)
Calcium: 9.6 mg/dL (ref 8.9–10.3)
Chloride: 93 mmol/L — ABNORMAL LOW (ref 98–111)
Chloride: 104 mmol/L (ref 98–111)
Chloride: 104 mmol/L (ref 98–111)
Creatinine, Ser: 1.36 mg/dL — ABNORMAL HIGH (ref 0.44–1.00)
Creatinine, Ser: 1.07 mg/dL — ABNORMAL HIGH (ref 0.44–1.00)
Creatinine, Ser: 1.07 mg/dL — ABNORMAL HIGH (ref 0.44–1.00)
GFR calc Af Amer: 55 mL/min — ABNORMAL LOW (ref >60)
GFR calc Af Amer: >60 mL/min (ref >60)
GFR calc Af Amer: >60 mL/min (ref >60)
GFR calc non Af Amer: 47 mL/min — ABNORMAL LOW (ref >60)
GFR calc non Af Amer: >60 mL/min (ref >60)
GFR calc non Af Amer: >60 mL/min (ref >60)
Glucose, Bld: 691 mg/dL (ref 70–99)
Glucose, Bld: 191 mg/dL — ABNORMAL HIGH (ref 70–99)
Glucose, Bld: 183 mg/dL — ABNORMAL HIGH (ref 70–99)
Potassium: 3.9 mmol/L (ref 3.5–5.1)
Potassium: 3.3 mmol/L — ABNORMAL LOW (ref 3.5–5.1)
Potassium: 3.4 mmol/L — ABNORMAL LOW (ref 3.5–5.1)
Sodium: 131 mmol/L — ABNORMAL LOW (ref 135–145)
Sodium: 138 mmol/L (ref 135–145)
Sodium: 138 mmol/L (ref 135–145)

## 2020-07-20 LAB — CBG MONITORING, ED
Glucose-Capillary: >600 mg/dL (ref 70–99)
Glucose-Capillary: >600 mg/dL (ref 70–99)
Glucose-Capillary: 540 mg/dL (ref 70–99)
Glucose-Capillary: 413 mg/dL — ABNORMAL HIGH (ref 70–99)
Glucose-Capillary: 395 mg/dL — ABNORMAL HIGH (ref 70–99)
Glucose-Capillary: 247 mg/dL — ABNORMAL HIGH (ref 70–99)
Glucose-Capillary: 173 mg/dL — ABNORMAL HIGH (ref 70–99)
Glucose-Capillary: 185 mg/dL — ABNORMAL HIGH (ref 70–99)
Glucose-Capillary: 165 mg/dL — ABNORMAL HIGH (ref 70–99)

## 2020-07-20 LAB — RAPID URINE DRUG SCREEN, HOSP PERFORMED
Amphetamines: NOT DETECTED
Barbiturates: NOT DETECTED
Benzodiazepines: NOT DETECTED
Cocaine: NOT DETECTED
Opiates: NOT DETECTED
Tetrahydrocannabinol: NOT DETECTED

## 2020-07-20 LAB — I-STAT VENOUS BLOOD GAS, ED
Acid-base deficit: 1 mmol/L (ref 0.0–2.0)
Bicarbonate: 24.9 mmol/L (ref 20.0–28.0)
Calcium, Ion: 1.21 mmol/L (ref 1.15–1.40)
HCT: 41 % (ref 36.0–46.0)
Hemoglobin: 13.9 g/dL (ref 12.0–15.0)
O2 Saturation: 45 %
Potassium: 3.9 mmol/L (ref 3.5–5.1)
Sodium: 134 mmol/L — ABNORMAL LOW (ref 135–145)
TCO2: 26 mmol/L (ref 22–32)
pCO2, Ven: 46.9 mmHg (ref 44.0–60.0)
pH, Ven: 7.333 (ref 7.250–7.430)
pO2, Ven: 27 mmHg — CL (ref 32.0–45.0)

## 2020-07-20 LAB — CBC
HCT: 41.6 % (ref 36.0–46.0)
HCT: 40.1 % (ref 36.0–46.0)
Hemoglobin: 14 g/dL (ref 12.0–15.0)
Hemoglobin: 13.6 g/dL (ref 12.0–15.0)
MCH: 28.7 pg (ref 26.0–34.0)
MCH: 28.9 pg (ref 26.0–34.0)
MCHC: 33.7 g/dL (ref 30.0–36.0)
MCHC: 33.9 g/dL (ref 30.0–36.0)
MCV: 85.2 fL (ref 80.0–100.0)
MCV: 85.1 fL (ref 80.0–100.0)
Platelets: 305 10*3/uL (ref 150–400)
Platelets: 275 10*3/uL (ref 150–400)
RBC: 4.88 MIL/uL (ref 3.87–5.11)
RBC: 4.71 MIL/uL (ref 3.87–5.11)
RDW: 11.9 % (ref 11.5–15.5)
RDW: 11.7 % (ref 11.5–15.5)
WBC: 7.2 10*3/uL (ref 4.0–10.5)
WBC: 8.6 10*3/uL (ref 4.0–10.5)
nRBC: 0 % (ref 0.0–0.2)
nRBC: 0 % (ref 0.0–0.2)

## 2020-07-20 LAB — COMPREHENSIVE METABOLIC PANEL
ALT: 18 U/L (ref 0–44)
AST: 15 U/L (ref 15–41)
Albumin: 3.9 g/dL (ref 3.5–5.0)
Alkaline Phosphatase: 117 U/L (ref 38–126)
Anion gap: 14 (ref 5–15)
BUN: 10 mg/dL (ref 6–20)
CO2: 20 mmol/L — ABNORMAL LOW (ref 22–32)
Calcium: 9.8 mg/dL (ref 8.9–10.3)
Chloride: 92 mmol/L — ABNORMAL LOW (ref 98–111)
Creatinine, Ser: 1.38 mg/dL — ABNORMAL HIGH (ref 0.44–1.00)
GFR calc Af Amer: 54 mL/min — ABNORMAL LOW (ref >60)
GFR calc non Af Amer: 46 mL/min — ABNORMAL LOW (ref >60)
Glucose, Bld: 847 mg/dL (ref 70–99)
Potassium: 3.9 mmol/L (ref 3.5–5.1)
Sodium: 126 mmol/L — ABNORMAL LOW (ref 135–145)
Total Bilirubin: 0.8 mg/dL (ref 0.3–1.2)
Total Protein: 8 g/dL (ref 6.5–8.1)

## 2020-07-20 LAB — I-STAT BETA HCG BLOOD, ED (MC, WL, AP ONLY): I-stat hCG, quantitative: <5 m[IU]/mL (ref <5)

## 2020-07-20 LAB — PROTIME-INR
INR: 1 (ref 0.8–1.2)
Prothrombin Time: 13.1 seconds (ref 11.4–15.2)

## 2020-07-20 LAB — HIV ANTIBODY (ROUTINE TESTING W REFLEX): HIV Screen 4th Generation wRfx: NONREACTIVE

## 2020-07-20 LAB — BETA-HYDROXYBUTYRIC ACID
Beta-Hydroxybutyric Acid: 0.6 mmol/L — ABNORMAL HIGH (ref 0.05–0.27)
Beta-Hydroxybutyric Acid: 0.21 mmol/L (ref 0.05–0.27)

## 2020-07-20 LAB — ETHANOL: Alcohol, Ethyl (B): <10 mg/dL (ref <10)

## 2020-07-20 LAB — SARS CORONAVIRUS 2 BY RT PCR (HOSPITAL ORDER, PERFORMED IN ~~LOC~~ HOSPITAL LAB): SARS Coronavirus 2: NEGATIVE

## 2020-07-20 LAB — APTT: aPTT: 36 seconds (ref 24–36)

## 2020-07-20 MED ORDER — DEXTROSE IN LACTATED RINGERS 5 % IV SOLN: INTRAVENOUS | Status: DC

## 2020-07-20 MED ORDER — ATORVASTATIN CALCIUM 40 MG PO TABS: 40.0000 mg | ORAL_TABLET | Freq: Every day | ORAL | Status: DC

## 2020-07-20 MED ORDER — STROKE: EARLY STAGES OF RECOVERY BOOK: Freq: Once | Status: AC

## 2020-07-20 MED ORDER — POTASSIUM CHLORIDE 10 MEQ/100ML IV SOLN
10.0000 meq | Freq: Once | INTRAVENOUS | Status: AC
  Administered 2020-07-20: 10 meq via INTRAVENOUS
  Filled 2020-07-20: qty 100

## 2020-07-20 MED ORDER — DEXTROSE 50 % IV SOLN: 0.0000 mL | INTRAVENOUS | Status: DC | PRN

## 2020-07-20 MED ORDER — SODIUM CHLORIDE 0.9% FLUSH: 3.0000 mL | Freq: Once | INTRAVENOUS | Status: DC

## 2020-07-20 MED ORDER — LACTATED RINGERS IV SOLN: INTRAVENOUS | Status: DC

## 2020-07-20 MED ORDER — POTASSIUM CHLORIDE CRYS ER 20 MEQ PO TBCR
20.0000 meq | EXTENDED_RELEASE_TABLET | Freq: Once | ORAL | Status: AC
  Administered 2020-07-20: 20 meq via ORAL
  Filled 2020-07-20: qty 1

## 2020-07-20 MED ORDER — LISINOPRIL 20 MG PO TABS: 40.0000 mg | ORAL_TABLET | Freq: Every day | ORAL | Status: DC

## 2020-07-20 MED ORDER — ASPIRIN EC 81 MG PO TBEC
81.0000 mg | DELAYED_RELEASE_TABLET | Freq: Every day | ORAL | Status: DC
  Administered 2020-07-21 – 2020-07-22 (×2): 81 mg via ORAL
  Filled 2020-07-20 (×2): qty 1

## 2020-07-20 MED ORDER — INSULIN REGULAR(HUMAN) IN NACL 100-0.9 UT/100ML-% IV SOLN
INTRAVENOUS | Status: DC
  Administered 2020-07-20: 6.5 [IU]/h via INTRAVENOUS
  Administered 2020-07-20: 13 [IU]/h via INTRAVENOUS
  Filled 2020-07-20: qty 100

## 2020-07-20 MED ORDER — SODIUM CHLORIDE 0.9 % IV BOLUS
500.0000 mL | Freq: Once | INTRAVENOUS | Status: AC
  Administered 2020-07-20: 1000 mL via INTRAVENOUS

## 2020-07-20 MED ORDER — ENOXAPARIN SODIUM 80 MG/0.8ML ~~LOC~~ SOLN
65.0000 mg | SUBCUTANEOUS | Status: DC
  Administered 2020-07-21: 65 mg via SUBCUTANEOUS
  Filled 2020-07-20: qty 0.65
  Filled 2020-07-20: qty 0.8
  Filled 2020-07-20: qty 0.65

## 2020-07-20 MED ORDER — HYDROCHLOROTHIAZIDE 25 MG PO TABS: 25.0000 mg | ORAL_TABLET | Freq: Every day | ORAL | Status: DC

## 2020-07-20 MED ORDER — DEXTROSE-NACL 5-0.9 % IV SOLN: INTRAVENOUS | Status: DC

## 2020-07-20 NOTE — Progress Notes (Signed)
I saw and examined Darlene Bennett.  I discussed with the full resident team and we agreed on a plan.  I will cosign the H&PE when it is available.  Briefly, 44 yo female with known DM2 presents with hyperglycemia x 1 month and intermitant slurred speech for 3 days.  Issues: 1. CNS lesion;  CT shows hyodense lesion suggesting subacute CVA.  Neuro has been consulted and we have ordered an MRI to better define.  We intend the usual stroke workup.  Interestingly, she is only 36 and has been on ASA and a statin. 2. Hyperglycemia.  Patient had good blood sugar control and an A1C<7 in June.  "Ever since I got my second covid shot, my blood sugars have been running high."  Has 300 + home BS and 600+ BS in ER.  No acidosis.  Rx as HHS.  Why did her control decompensate? See resident note for full problem list.

## 2020-07-20 NOTE — H&P (Signed)
Family Medicine Teaching [facility] Admission History and Physical Service Pager: [phone]  Patient name: Darlene Bennett Medical record number: [id number] Date of birth: October 31, 1976 Age: 44 y.o. Gender: female  Primary Care Provider: Towanda Octave, MD Consultants:  Code Status: Full Code  Preferred Emergency Contact: Mom Jamey Demchak [phone] And Husband   Chief Complaint: Intermittent slurred speech  Assessment and Plan: Darlene Bennett is a 44 y.o. female presenting with reports of intermittent slurred speech. PMH is significant for T2DM, HTN, morbid obesity, chronic pain, HLD, tobacco use.  Intermittent dysarthria -acute, stable Patient reported intermittent slurred speech beginning Saturday 8/14.  Differential diagnosis include stroke vs TIA.  CT scan at presentation depicted foci of abnormal cortical/subcortical hypodensity within the mid to posterior right frontal lobe/right frontal operculum and right parietal lobe/temporoparietal junction reflecting early subacute infarcts.  Neuro consulted in ED and they recommended MRI brain.  Patient is at increased risk for stroke due to history of HTN, T2DM, and tobacco use.  Blood glucose at presentation significantly elevated at 847 increasing patient's risk for stroke in setting of uncontrolled DM.  TIA is less likely due to suspicious findings on CT.  On neurological exam, patient does not exhibit neurological deficits initially and patient appeared to run to baseline function following dysarthric events. Did note an episode of dysarthria during exam.  BP at admission range 128/97-141/114.  HR range 94-102. Patient's home meds include ASA, atorvastatin 40, Synjardy 04-999 mg (empagliflozin-metformin HCL), lisinopril, and HCTZ, patient reports regular adherence to medication regimen.  UDS pan-negative. -Admit to progressive, FPTS, Dr. Leveda Anna - f/u MRI Brain, consider consulting neuro pending results -PT/OT - continue home ASA  - Continue home  atorvastatin -lipid panel  -AM CMP and CBC - will hold BP medication in order to prevent decreased perfusion to areas of concern on head CT   Hyperglycemic crisis, T2DM  Patient has Hx of T2 DM.  HgbA1c 05/2020 was 6.8.  How medication include Synjardy.  CBG at presentation was 847. Na corrected for hyperglycemia 138. K 3.9.  VBG pH 0.3, PCO2 46.9, PO2 27, HCO3 24.9. AG14.  Labs do not support DKA.  Concern for HOHS as patient UA negative for ketoacidosis and patient presented with prerenal AKI, and elevated HCO3, .  BHBG elevated, 0.6. Patient started on insulin GTT in ED repeat BGL 690.  Low suspicion for infection given WBC 7.2 at admission.  Patient reports poor compliance recently with diabetes medication. -f/u CBG -continue insulin gtt  -Continue mIVF LR -monitor electrolytes with BMP q4 hours, BHBq 4 hours  - transition to subcutaneous insulin once blood glucose below 200s for two consecutive measurements and BHB within normal range  - start sensitive sliding scale insulin  -continue CBG monitoring   AKI, prerenal-acute, improving Creatinine at presentation 1.38. Patient appears to have baseline within normal range.   Patient received fluid bolus and MIVF LR 125 mL/h in ED repeat creatinine 1.10.  UA resulted with significant glucosuria, small hemoglobin urine dipstick, small leukocytes. -Continue MIVF LR 125/h - f/u CMP AM  Vaginal Candida  Patient reports a rash in her genital area that she attributes to her elevated Blood glucose. U/A noted to have yeast.  - Diflucan    FEN/GI: carb modified  Prophylaxis: Lovenox   Disposition: progressive   History of Present Illness:  Darlene Bennett is a 44 y.o. female presenting with acute onset intermittent dysarthria.  Patient reports that she noticed her speech has been slurred for the last two days. She denies  any inciting event that seemed to trigger this change in her speech. Patient reports right sided headache that began last night.  She reports that her vision is sometimes blurry and began yesterday. No recent travel. Patient states she has been able to eat and drink normally. She reports that she changed her diet in the last 2-3 months to help with her diabetes management including cutting back on rice and bread and increasing her intake of green vegetables. Reports compliance with medications including ASA and atorvastatin. Family has noticed the slurred speech and it seemed to resolve after a nap.   Last covid vaccine on 7/28 and patient noticed that her BG trend was increasing. At home, BG was 398 and patient thought she just needed to take her medication, then it would improve. Denies any sick contacts.   Review Of Systems: Per HPI with the following additions:  Review of Systems  Constitutional: Negative for appetite change and fever.  Eyes: Positive for visual disturbance.  Respiratory: Negative for shortness of breath.   Cardiovascular: Negative for chest pain.  Gastrointestinal: Negative for abdominal pain, constipation and diarrhea.  Neurological: Positive for speech difficulty and headaches. Negative for syncope.     Patient Active Problem List   Diagnosis Date Noted  . Blister of breast with infection, left, initial encounter 07/09/2020  . Morbid obesity ([place]) 05/11/2020  . Chronic back pain 10/31/2018  . Right sciatic nerve pain 10/31/2018  . Routine cervical smear 04/23/2018  . Epidermoid cyst 08/04/2016  . Preventative health care 02/20/2016  . Contraceptive management 05/25/2015  . Type 2 diabetes mellitus ([place]) 04/24/2015  . Microalbuminuria due to type 2 diabetes mellitus ([place]) 03/25/2015  . Hyperlipemia 03/09/2015  . Tobacco use disorder 03/09/2015  . Essential hypertension 11/15/2012    Past Medical History: Past Medical History:  Diagnosis Date  . Gestational diabetes    insulin  . Gestational diabetes mellitus, antepartum 02/14/2013  . History of gestational diabetes   . Hypertension     preeclampsia with previous pregnancy  . Scalp cyst     Past Surgical History: Past Surgical History:  Procedure Laterality Date  . CYST EXCISION N/A 09/15/2016   Procedure: EXCISION POSTERIOR SCALP CYST;  Surgeon: Clovis Riley, MD;  Location: [facility] ORS;  Service: General;  Laterality: N/A;  . TUBAL LIGATION     2007    Social History: Social History   Tobacco Use  . Smoking status: Current Every Day Smoker    Packs/day: 0.25    Years: 22.00    Pack years: 5.50    Types: Cigarettes  . Smokeless tobacco: Never Used  . Tobacco comment: working on it now; has cut back from 0.5  Substance Use Topics  . Alcohol use: No    Alcohol/week: 0.0 standard drinks  . Drug use: No   Please also refer to relevant sections of EMR.  Family History: Family History  Problem Relation Age of Onset  . Heart disease Mother   . Hypertension Mother   . Diabetes Mother   . Hypertension Father   . Diabetes Father      Allergies and Medications: No Known Allergies No current facility-administered medications on file prior to encounter.   Current Outpatient Medications on File Prior to Encounter  Medication Sig Dispense Refill  . ACCU-CHEK FASTCLIX LANCETS MISC 1 each by Does not apply route 4 (four) times daily -  before meals and at bedtime. Check sugar 6 x daily 204 each 3  . aspirin EC  81 MG tablet Take 1 tablet (81 mg total) by mouth daily. 90 tablet 3  . atorvastatin (LIPITOR) 40 MG tablet Take 1 tablet (40 mg total) by mouth daily. 90 tablet 3  . Blood Glucose Monitoring Suppl (ACCU-CHEK NANO SMARTVIEW) W/DEVICE KIT 1 Device by Does not apply route 4 (four) times daily -  before meals and at bedtime. 1 kit 0  . Empagliflozin-metFORMIN HCl (SYNJARDY) 04-999 MG TABS Take 1 tablet by mouth 2 (two) times daily. 180 tablet 3  . glucose blood test strip Use to check blood sugar once daily or as directed. 30 each 5  . hydrochlorothiazide (HYDRODIURIL) 25 MG tablet Take 1 tablet (25 mg  total) by mouth daily. 30 tablet 0  . lisinopril (ZESTRIL) 40 MG tablet Take 1 tablet (40 mg total) by mouth at bedtime. 90 tablet 3  . medroxyPROGESTERone (DEPO-PROVERA) 150 MG/ML injection Inject 1 mL (150 mg total) into the muscle every 3 (three) months. 1 mL 0  . naproxen (NAPROSYN) 500 MG tablet Take 1 tablet (500 mg total) by mouth 2 (two) times daily. (Patient not taking: Reported on 07/09/2020) 10 tablet 0    Objective: BP (!) 128/97 (BP Location: Right Arm)   Pulse 94   Temp 99.7 F (37.6 C) (Oral)   Resp (!) 22   Ht 6' (1.829 m)   Wt 129.7 kg   SpO2 96%   BMI 38.79 kg/m   Exam: General: female appearing stated age lyin in bed in NAD  Eyes: tearful, conjunctival injection bilaterally  ENTM: dry mucous membranes  Cardiovascular: RRR without murmur, radial pulses palpable bilaterally  Respiratory: CTAB without wheezing or increased WOB Gastrointestinal: soft, NT, bowel sound present  MSK: moves extremities with normal ROM  Neuro: alert and oriented to self, location and situation, and year; CN grossly intact bilaterally, patient noted to have slurred speech that spontaneously resolved during interview Psych: tearful on exam, reports feeling overwhelmed with new concern for health   Labs and Imaging: CBC BMET  Recent Labs  Lab 07/20/20 1022 07/20/20 1022 07/20/20 1353  WBC 7.2  --   --   HGB 14.0   < > 13.9  14.6  HCT 41.6   < > 41.0  43.0  PLT 305  --   --    < > = values in this interval not displayed.   Recent Labs  Lab 07/20/20 1022 07/20/20 1022 07/20/20 1353  NA 126*   < > 134*  134*  K 3.9   < > 3.9  4.0  CL 92*   < > 97*  CO2 20*  --   --   BUN 10   < > 12  CREATININE 1.38*   < > 1.10*  GLUCOSE 847*   < > 690*  CALCIUM 9.8  --   --    < > = values in this interval not displayed.     EKG: 07/20/2020- Sinus tachycardia  CT HEAD WO CONTRAST  Result Date: 07/20/2020 CLINICAL DATA:  Neuro deficit, acute, stroke suspected. Additional history  provided: Patient reports intermittent slurred speech since Saturday. EXAM: CT HEAD WITHOUT CONTRAST TECHNIQUE: Contiguous axial images were obtained from the base of the skull through the vertex without intravenous contrast. COMPARISON:  No pertinent prior exams are available for comparison. FINDINGS: Brain: There is abnormal cortical/subcortical hypodensity measuring 2.4 x 3.6 x 3.7 cm within the mid to posterior right frontal lobe and right frontal operculum (for instance as seen on series 8, image  21). There is subtle cortical hyperdensity in this region likely reflecting mild petechial hemorrhage. There is an additional focus of abnormal cortical/subcortical hypodensity within the right parietal lobe/temporoparietal junction measuring 2.2 x 2.3 x 3.2 cm (for instance as seen on series 3, image 23) (series 5, image 51). Subtle petechial hemorrhage is also questioned in this region. There is no significant mass effect. No midline shift. No extra-axial fluid collection is present. Cerebral volume is normal for age. There is a 5 mm focus of dural-based calcification within the right cerebellopontine angle, abutting the porous acusticus (series 4, image 22). This is nonspecific but a small calcified meningioma is difficult to definitively exclude. Vascular: No hyperdense vessel. Skull: No calvarial fracture. Sinuses/Orbits: Visualized orbits show no acute finding. Mild ethmoid sinus mucosal thickening. No significant mastoid effusion. These results were called by telephone at the time of interpretation on 07/20/2020 at 11:13 am to provider Carmin Muskrat , who verbally acknowledged these results. IMPRESSION: Foci of abnormal cortical/subcortical hypodensity within the mid to posterior right frontal lobe/right frontal operculum and right parietal lobe/temporoparietal junction, as described and likely reflecting early subacute infarcts. Brain MRI is recommended for further evaluation and to exclude alternative  etiologies (i.e. masses with associated parenchymal edema). There is subtle cortical hyperdensity in these regions likely reflecting mild petechial hemorrhage. No significant mass effect. 5 mm dural-based focus of calcification within the right cerebellopontine angle. This is nonspecific, although a tiny incidental calcified meningioma is difficult to exclude. Mild ethmoid sinus mucosal thickening. Electronically Signed   By: Kellie Simmering DO   On: 07/20/2020 11:13    Freida Busman, MD 07/20/2020, 2:01 PM PGY-1, [facility] Intern pager: [phone], text pages welcome  FPTS Upper-Level Resident Addendum   I have independently interviewed and examined the patient. I have discussed the above with the original author and agree with their documentation. My edits for correction/addition/clarification are in blue. Please see also any attending notes.   Eulis Foster, MD PGY-2, [facility] 07/20/2020 11:15 PM  FPTS Service pager: [phone] (text pages welcome through [facility])

## 2020-07-20 NOTE — ED Notes (Signed)
Pt requesting something to drink

## 2020-07-20 NOTE — ED Provider Notes (Signed)
[facility] EMERGENCY DEPARTMENT Provider Note   CSN: [id number] Arrival date & time: 07/20/20  1196     History Chief Complaint  Patient presents with  . Aphasia    Darlene Bennett is a 43 y.o. female history of type II diabetes, hypertension, morbid obesity, chronic pain, hyperlipidemia, tobacco use.  Patient presents today for slurred speech that began on Saturday, she reports intermittent slurred speech that woke her in the middle of some sentences, this first occurred when speaking with her friends on Saturday.  She denies any current difficulty speaking/slurred speech.  Denies any other concerns.  Denies fall/injury, fever/chills, recent illness, headache, vision changes, facial asymmetry, numbness, balance issues, weakness, abdominal pain, nausea/vomiting, diarrhea, dysuria/hematuria or any additional concerns.  HPI     Past Medical History:  Diagnosis Date  . Gestational diabetes    insulin  . Gestational diabetes mellitus, antepartum 02/14/2013  . History of gestational diabetes   . Hypertension    preeclampsia with previous pregnancy  . Scalp cyst     Patient Active Problem List   Diagnosis Date Noted  . Blister of breast with infection, left, initial encounter 07/09/2020  . Morbid obesity (HCC) 05/11/2020  . Chronic back pain 10/31/2018  . Right sciatic nerve pain 10/31/2018  . Routine cervical smear 04/23/2018  . Epidermoid cyst 08/04/2016  . Preventative health care 02/20/2016  . Contraceptive management 05/25/2015  . Type 2 diabetes mellitus (HCC) 04/24/2015  . Microalbuminuria due to type 2 diabetes mellitus (HCC) 03/25/2015  . Hyperlipemia 03/09/2015  . Tobacco use disorder 03/09/2015  . Essential hypertension 11/15/2012    Past Surgical History:  Procedure Laterality Date  . CYST EXCISION N/A 09/15/2016   Procedure: EXCISION POSTERIOR SCALP CYST;  Surgeon: Berna Bue, MD;  Location: [facility];  Service: General;  Laterality: N/A;   . TUBAL LIGATION     2007     OB History    Gravida  4   Para  4   Term  3   Preterm  1   AB  0   Living  4     SAB  0   TAB  0   Ectopic  0   Multiple  0   Live Births  4           Family History  Problem Relation Age of Onset  . Heart disease Mother   . Hypertension Mother   . Diabetes Mother   . Hypertension Father   . Diabetes Father     Social History   Tobacco Use  . Smoking status: Current Every Day Smoker    Packs/day: 0.25    Years: 22.00    Pack years: 5.50    Types: Cigarettes  . Smokeless tobacco: Never Used  . Tobacco comment: working on it now; has cut back from 0.5  Substance Use Topics  . Alcohol use: No    Alcohol/week: 0.0 standard drinks  . Drug use: No    Home Medications Prior to Admission medications   Medication Sig Start Date End Date Taking? Authorizing Provider  ACCU-CHEK FASTCLIX LANCETS MISC 1 each by Does not apply route 4 (four) times daily -  before meals and at bedtime. Check sugar 6 x daily 05/11/17  Yes Pincus Large, DO  aspirin EC 81 MG tablet Take 1 tablet (81 mg total) by mouth daily. 06/20/19  Yes Dana Allan, MD  atorvastatin (LIPITOR) 40 MG tablet Take 1 tablet (40 mg total) by  mouth daily. 11/19/19  Yes Lattie Haw, MD  Blood Glucose Monitoring Suppl (ACCU-CHEK NANO SMARTVIEW) W/DEVICE KIT 1 Device by Does not apply route 4 (four) times daily -  before meals and at bedtime. 03/09/15  Yes Luiz Blare Y, DO  Empagliflozin-metFORMIN HCl (SYNJARDY) 04-999 MG TABS Take 1 tablet by mouth 2 (two) times daily. 12/24/19  Yes Lattie Haw, MD  glucose blood test strip Use to check blood sugar once daily or as directed. 05/14/20  Yes Lattie Haw, MD  hydrochlorothiazide (HYDRODIURIL) 25 MG tablet Take 1 tablet (25 mg total) by mouth daily. 06/25/20 07/25/20 Yes Lattie Haw, MD  lisinopril (ZESTRIL) 40 MG tablet Take 1 tablet (40 mg total) by mouth at bedtime. 05/14/20  Yes Lattie Haw, MD  medroxyPROGESTERone  (DEPO-PROVERA) 150 MG/ML injection Inject 1 mL (150 mg total) into the muscle every 3 (three) months. 05/14/20  Yes Lattie Haw, MD  naproxen (NAPROSYN) 500 MG tablet Take 1 tablet (500 mg total) by mouth 2 (two) times daily. Patient not taking: Reported on 07/09/2020 10/10/19   Petrucelli, Glynda Jaeger, PA-C    Allergies    Patient has no known allergies.  Review of Systems   Review of Systems Ten systems are reviewed and are negative for acute change except as noted in the HPI  Physical Exam Updated Vital Signs BP (!) 128/97 (BP Location: Right Arm)   Pulse 94   Temp 99.7 F (37.6 C) (Oral)   Resp (!) 22   Ht 6' (1.829 m)   Wt 129.7 kg   SpO2 96%   BMI 38.79 kg/m   Physical Exam Constitutional:      General: She is not in acute distress.    Appearance: Normal appearance. She is well-developed. She is not ill-appearing or diaphoretic.  HENT:     Head: Normocephalic and atraumatic.  Eyes:     General: Vision grossly intact. Gaze aligned appropriately.     Pupils: Pupils are equal, round, and reactive to light.  Neck:     Trachea: Trachea and phonation normal.  Pulmonary:     Effort: Pulmonary effort is normal. No respiratory distress.  Abdominal:     General: There is no distension.     Palpations: Abdomen is soft.     Tenderness: There is no abdominal tenderness. There is no guarding or rebound.  Musculoskeletal:        General: Normal range of motion.     Cervical back: Normal range of motion.  Skin:    General: Skin is warm and dry.  Neurological:     Mental Status: She is alert.     GCS: GCS eye subscore is 4. GCS verbal subscore is 5. GCS motor subscore is 6.     Comments: Mental Status: Alert, oriented, thought content appropriate, able to give a coherent history. Speech fluent without evidence of aphasia. Able to follow 2 step commands without difficulty. Cranial Nerves: II: Peripheral visual fields grossly normal, pupils equal, round, reactive to  light III,IV, VI: ptosis not present, extra-ocular motions intact bilaterally V,VII: smile symmetric, eyebrows raise symmetric, facial light touch sensation equal VIII: hearing grossly normal to voice X: uvula elevates symmetrically XI: bilateral shoulder shrug symmetric and strong XII: midline tongue extension without fassiculations Motor: Normal tone. 5/5 strength in upper and lower extremities bilaterally including strong and equal grip strength and dorsiflexion/plantar flexion Sensory: Sensation intact to light touch in all extremities.Negative Romberg.  Cerebellar: normal finger-to-nose with bilateral upper extremities. Normal heel-to -shin balance bilaterally  of the lower extremity. No pronator drift.  Gait: normal gait and balance CV: distal pulses palpable throughout  Psychiatric:        Behavior: Behavior normal.     ED Results / Procedures / Treatments   Labs (all labs ordered are listed, but only abnormal results are displayed) Labs Reviewed  COMPREHENSIVE METABOLIC PANEL - Abnormal; Notable for the following components:      Result Value   Sodium 126 (*)    Chloride 92 (*)    CO2 20 (*)    Glucose, Bld 847 (*)    Creatinine, Ser 1.38 (*)    GFR calc non Af Amer 46 (*)    GFR calc Af Amer 54 (*)    All other components within normal limits  CBG MONITORING, ED - Abnormal; Notable for the following components:   Glucose-Capillary >600 (*)    All other components within normal limits  CBG MONITORING, ED - Abnormal; Notable for the following components:   Glucose-Capillary >600 (*)    All other components within normal limits  I-STAT CHEM 8, ED - Abnormal; Notable for the following components:   Sodium 134 (*)    Chloride 97 (*)    Creatinine, Ser 1.10 (*)    Glucose, Bld 690 (*)    All other components within normal limits  I-STAT VENOUS BLOOD GAS, ED - Abnormal; Notable for the following components:   pO2, Ven 27.0 (*)    Sodium 134 (*)    All other  components within normal limits  SARS CORONAVIRUS 2 BY RT PCR (HOSPITAL ORDER, [facility])  PROTIME-INR  APTT  CBC  DIFFERENTIAL  ETHANOL  RAPID URINE DRUG SCREEN, HOSP PERFORMED  URINALYSIS, ROUTINE W REFLEX MICROSCOPIC  BASIC METABOLIC PANEL  BETA-HYDROXYBUTYRIC ACID  I-STAT BETA HCG BLOOD, ED ([facility], [facility], [facility] ONLY)  CBG MONITORING, ED    EKG EKG Interpretation  Date/Time:  Monday July 20 2020 10:13:56 EDT Ventricular Rate:  101 PR Interval:  122 QRS Duration: 92 QT Interval:  344 QTC Calculation: 446 R Axis:   63 Text Interpretation: Sinus tachycardia T wave abnormality, consider inferior ischemia Abnormal ECG Confirmed by Virgel Manifold [phone]) on 07/20/2020 1:18:05 PM   Radiology CT HEAD WO CONTRAST  Result Date: 07/20/2020 CLINICAL DATA:  Neuro deficit, acute, stroke suspected. Additional history provided: Patient reports intermittent slurred speech since Saturday. EXAM: CT HEAD WITHOUT CONTRAST TECHNIQUE: Contiguous axial images were obtained from the base of the skull through the vertex without intravenous contrast. COMPARISON:  No pertinent prior exams are available for comparison. FINDINGS: Brain: There is abnormal cortical/subcortical hypodensity measuring 2.4 x 3.6 x 3.7 cm within the mid to posterior right frontal lobe and right frontal operculum (for instance as seen on series 8, image 21). There is subtle cortical hyperdensity in this region likely reflecting mild petechial hemorrhage. There is an additional focus of abnormal cortical/subcortical hypodensity within the right parietal lobe/temporoparietal junction measuring 2.2 x 2.3 x 3.2 cm (for instance as seen on series 3, image 23) (series 5, image 51). Subtle petechial hemorrhage is also questioned in this region. There is no significant mass effect. No midline shift. No extra-axial fluid collection is present. Cerebral volume is normal for age. There is a 5 mm focus of dural-based  calcification within the right cerebellopontine angle, abutting the porous acusticus (series 4, image 22). This is nonspecific but a small calcified meningioma is difficult to definitively exclude. Vascular: No hyperdense vessel. Skull: No calvarial  fracture. Sinuses/Orbits: Visualized orbits show no acute finding. Mild ethmoid sinus mucosal thickening. No significant mastoid effusion. These results were called by telephone at the time of interpretation on 07/20/2020 at 11:13 am to provider Carmin Muskrat , who verbally acknowledged these results. IMPRESSION: Foci of abnormal cortical/subcortical hypodensity within the mid to posterior right frontal lobe/right frontal operculum and right parietal lobe/temporoparietal junction, as described and likely reflecting early subacute infarcts. Brain MRI is recommended for further evaluation and to exclude alternative etiologies (i.e. masses with associated parenchymal edema). There is subtle cortical hyperdensity in these regions likely reflecting mild petechial hemorrhage. No significant mass effect. 5 mm dural-based focus of calcification within the right cerebellopontine angle. This is nonspecific, although a tiny incidental calcified meningioma is difficult to exclude. Mild ethmoid sinus mucosal thickening. Electronically Signed   By: Kellie Simmering DO   On: 07/20/2020 11:13    Procedures .Critical Care Performed by: Deliah Boston, PA-C Authorized by: Deliah Boston, PA-C   Critical care provider statement:    Critical care time (minutes):  35   Critical care was time spent personally by me on the following activities:  Discussions with consultants, evaluation of patient's response to treatment, examination of patient, ordering and performing treatments and interventions, ordering and review of laboratory studies, ordering and review of radiographic studies, pulse oximetry, re-evaluation of patient's condition, obtaining history from patient or  surrogate and review of old charts   (including critical care time)  Medications Ordered in ED Medications  sodium chloride flush (NS) 0.9 % injection 3 mL (3 mLs Intravenous Not Given 07/20/20 1324)  insulin regular, human (MYXREDLIN) 100 units/ 100 mL infusion (13 Units/hr Intravenous New Bag/Given 07/20/20 1345)  lactated ringers infusion (has no administration in time range)  dextrose 5 % in lactated ringers infusion (has no administration in time range)  dextrose 50 % solution 0-50 mL (has no administration in time range)  potassium chloride 10 mEq in 100 mL IVPB (has no administration in time range)  sodium chloride 0.9 % bolus 500 mL (1,000 mLs Intravenous New Bag/Given 07/20/20 1258)  potassium chloride SA (KLOR-CON) CR tablet 20 mEq (20 mEq Oral Given 07/20/20 1332)    ED Course  I have reviewed the triage vital signs and the nursing notes.  Pertinent labs & imaging results that were available during my care of the patient were reviewed by me and considered in my medical decision making (see chart for details).  Clinical Course as of Jul 21 1415  Mon Jul 20, 2020  1225 Dr. Leonel Ramsay   [BM]  [id number] Dr. Quentin Cornwall   [BM]    Clinical Course User Index [BM] Gari Crown   MDM Rules/Calculators/A&P                         Additional history obtained from: 1. Nursing notes from this visit. 2. Electronic medical record. ------------------------------------------- Stroke lab work ordered in triage along with CT head, reviewed below.  Pregnancy test negative.  APTT/PT/INR within normal limits.  CBG CMP significant for hyponatremia over 600.  Of 126, bicarb 20, glucose 847, normal gap.  Creatinine of 1.38 appears baseline.  CBC with normal limits.   CT Head:  IMPRESSION:  Foci of abnormal cortical/subcortical hypodensity within the mid to  posterior right frontal lobe/right frontal operculum and right  parietal lobe/temporoparietal junction, as described and likely   reflecting early subacute infarcts. Brain MRI is recommended for  further evaluation and  to exclude alternative etiologies (i.e.  masses with associated parenchymal edema). There is subtle cortical  hyperdensity in these regions likely reflecting mild petechial  hemorrhage. No significant mass effect.    5 mm dural-based focus of calcification within the right  cerebellopontine angle. This is nonspecific, although a tiny  incidental calcified meningioma is difficult to exclude.    Mild ethmoid sinus mucosal thickening.   MRI ordered in triage.  EKG:  Sinus tachycardia T wave abnormality, consider inferior ischemia Abnormal ECG Confirmed by Virgel Manifold [phone]) on 07/20/2020 1:18:05 PM ------------------------------ On evaluation patient well-appearing no acute distress normal neurologic exam.  Consult called to Dr. Leonel Ramsay, spoke at 12:25 PM, agrees with MRI brain. - I have ordered the remainder of the labs including ethanol, urinalysis, UDS and Covid test.  I have also added VBG and beta hydroxybutyrate acid.  Patient given fluid bolus.  Given significant hyperglycemia and hyponatremia Endo tool used and additional IV fluids, potassium, insulin and dextrose were ordered.  Plan of care is to admit patient to hospitalist service for further treatment and follow-up on MRI.  Patient is agreeable to plan.  Discussed plan with Dr. Wilson Singer who agrees with treatment and admission. - VBG shows low PO2 expected for venous blood gas, normal pH. -  2:02 PM: Discussed case with Dr. Quentin Cornwall, patient has been accepted to family medicine teaching service.    Note: Portions of this report may have been transcribed using voice recognition software. Every effort was made to ensure accuracy; however, inadvertent computerized transcription errors may still be present. Final Clinical Impression(s) / ED Diagnoses Final diagnoses:  Slurred speech  Hyperglycemia    Rx / DC Orders ED Discharge  Orders    None       Gari Crown 07/20/20 1417    Virgel Manifold, MD 07/20/20 1420

## 2020-07-20 NOTE — ED Triage Notes (Signed)
Pt reports intermittent slurred speech since Saturday. No weakness, numbness, headache or dizziness noted. Pt a.o, tearful in triage. CBG reading HIGH in triage.

## 2020-07-21 ENCOUNTER — Observation Stay (HOSPITAL_COMMUNITY): Payer: Medicaid Other

## 2020-07-21 DIAGNOSIS — E785 Hyperlipidemia, unspecified: Secondary | ICD-10-CM | POA: Diagnosis present

## 2020-07-21 DIAGNOSIS — R4701 Aphasia: Secondary | ICD-10-CM | POA: Diagnosis present

## 2020-07-21 DIAGNOSIS — R4781 Slurred speech: Secondary | ICD-10-CM | POA: Diagnosis present

## 2020-07-21 DIAGNOSIS — Z6838 Body mass index (BMI) 38.0-38.9, adult: Secondary | ICD-10-CM | POA: Diagnosis not present

## 2020-07-21 DIAGNOSIS — G8194 Hemiplegia, unspecified affecting left nondominant side: Secondary | ICD-10-CM | POA: Diagnosis present

## 2020-07-21 DIAGNOSIS — Z8249 Family history of ischemic heart disease and other diseases of the circulatory system: Secondary | ICD-10-CM | POA: Diagnosis not present

## 2020-07-21 DIAGNOSIS — Z7984 Long term (current) use of oral hypoglycemic drugs: Secondary | ICD-10-CM | POA: Diagnosis not present

## 2020-07-21 DIAGNOSIS — Z833 Family history of diabetes mellitus: Secondary | ICD-10-CM | POA: Diagnosis not present

## 2020-07-21 DIAGNOSIS — R471 Dysarthria and anarthria: Secondary | ICD-10-CM

## 2020-07-21 DIAGNOSIS — E1100 Type 2 diabetes mellitus with hyperosmolarity without nonketotic hyperglycemic-hyperosmolar coma (NKHHC): Secondary | ICD-10-CM | POA: Diagnosis present

## 2020-07-21 DIAGNOSIS — I6389 Other cerebral infarction: Secondary | ICD-10-CM | POA: Diagnosis not present

## 2020-07-21 DIAGNOSIS — E876 Hypokalemia: Secondary | ICD-10-CM | POA: Diagnosis not present

## 2020-07-21 DIAGNOSIS — R233 Spontaneous ecchymoses: Secondary | ICD-10-CM | POA: Diagnosis present

## 2020-07-21 DIAGNOSIS — I10 Essential (primary) hypertension: Secondary | ICD-10-CM | POA: Diagnosis present

## 2020-07-21 DIAGNOSIS — Z7982 Long term (current) use of aspirin: Secondary | ICD-10-CM | POA: Diagnosis not present

## 2020-07-21 DIAGNOSIS — I63511 Cerebral infarction due to unspecified occlusion or stenosis of right middle cerebral artery: Secondary | ICD-10-CM

## 2020-07-21 DIAGNOSIS — R739 Hyperglycemia, unspecified: Secondary | ICD-10-CM

## 2020-07-21 DIAGNOSIS — E1165 Type 2 diabetes mellitus with hyperglycemia: Secondary | ICD-10-CM | POA: Diagnosis present

## 2020-07-21 DIAGNOSIS — I63411 Cerebral infarction due to embolism of right middle cerebral artery: Secondary | ICD-10-CM | POA: Diagnosis present

## 2020-07-21 DIAGNOSIS — F17210 Nicotine dependence, cigarettes, uncomplicated: Secondary | ICD-10-CM | POA: Diagnosis present

## 2020-07-21 DIAGNOSIS — Z20822 Contact with and (suspected) exposure to covid-19: Secondary | ICD-10-CM | POA: Diagnosis present

## 2020-07-21 DIAGNOSIS — Z79899 Other long term (current) drug therapy: Secondary | ICD-10-CM | POA: Diagnosis not present

## 2020-07-21 DIAGNOSIS — Z8632 Personal history of gestational diabetes: Secondary | ICD-10-CM | POA: Diagnosis not present

## 2020-07-21 DIAGNOSIS — R29701 NIHSS score 1: Secondary | ICD-10-CM | POA: Diagnosis present

## 2020-07-21 DIAGNOSIS — G8929 Other chronic pain: Secondary | ICD-10-CM | POA: Diagnosis present

## 2020-07-21 DIAGNOSIS — B373 Candidiasis of vulva and vagina: Secondary | ICD-10-CM | POA: Diagnosis present

## 2020-07-21 DIAGNOSIS — N179 Acute kidney failure, unspecified: Secondary | ICD-10-CM | POA: Diagnosis present

## 2020-07-21 LAB — CBG MONITORING, ED
Glucose-Capillary: 191 mg/dL — ABNORMAL HIGH (ref 70–99)
Glucose-Capillary: 195 mg/dL — ABNORMAL HIGH (ref 70–99)
Glucose-Capillary: 195 mg/dL — ABNORMAL HIGH (ref 70–99)
Glucose-Capillary: 214 mg/dL — ABNORMAL HIGH (ref 70–99)
Glucose-Capillary: 238 mg/dL — ABNORMAL HIGH (ref 70–99)
Glucose-Capillary: 287 mg/dL — ABNORMAL HIGH (ref 70–99)

## 2020-07-21 LAB — CBC
HCT: 40.1 % (ref 36.0–46.0)
Hemoglobin: 13.6 g/dL (ref 12.0–15.0)
MCH: 29.1 pg (ref 26.0–34.0)
MCHC: 33.9 g/dL (ref 30.0–36.0)
MCV: 85.7 fL (ref 80.0–100.0)
Platelets: 266 10*3/uL (ref 150–400)
RBC: 4.68 MIL/uL (ref 3.87–5.11)
RDW: 12 % (ref 11.5–15.5)
WBC: 8.2 10*3/uL (ref 4.0–10.5)
nRBC: 0 % (ref 0.0–0.2)

## 2020-07-21 LAB — LIPID PANEL
Cholesterol: 279 mg/dL — ABNORMAL HIGH (ref 0–200)
HDL: 29 mg/dL — ABNORMAL LOW (ref >40)
LDL Cholesterol: 192 mg/dL — ABNORMAL HIGH (ref 0–99)
Total CHOL/HDL Ratio: 9.6 RATIO
Triglycerides: 288 mg/dL — ABNORMAL HIGH (ref <150)
VLDL: 58 mg/dL — ABNORMAL HIGH (ref 0–40)

## 2020-07-21 LAB — BASIC METABOLIC PANEL
Anion gap: 10 (ref 5–15)
BUN: 10 mg/dL (ref 6–20)
CO2: 22 mmol/L (ref 22–32)
Calcium: 9.1 mg/dL (ref 8.9–10.3)
Chloride: 107 mmol/L (ref 98–111)
Creatinine, Ser: 1.06 mg/dL — ABNORMAL HIGH (ref 0.44–1.00)
GFR calc Af Amer: >60 mL/min (ref >60)
GFR calc non Af Amer: >60 mL/min (ref >60)
Glucose, Bld: 205 mg/dL — ABNORMAL HIGH (ref 70–99)
Potassium: 3.3 mmol/L — ABNORMAL LOW (ref 3.5–5.1)
Sodium: 139 mmol/L (ref 135–145)

## 2020-07-21 LAB — GLUCOSE, CAPILLARY
Glucose-Capillary: 286 mg/dL — ABNORMAL HIGH (ref 70–99)
Glucose-Capillary: 300 mg/dL — ABNORMAL HIGH (ref 70–99)
Glucose-Capillary: 332 mg/dL — ABNORMAL HIGH (ref 70–99)

## 2020-07-21 LAB — HEMOGLOBIN A1C
Hgb A1c MFr Bld: 13.5 % — ABNORMAL HIGH (ref 4.8–5.6)
Mean Plasma Glucose: 340.75 mg/dL

## 2020-07-21 LAB — BETA-HYDROXYBUTYRIC ACID
Beta-Hydroxybutyric Acid: 0.72 mmol/L — ABNORMAL HIGH (ref 0.05–0.27)
Beta-Hydroxybutyric Acid: 0.62 mmol/L — ABNORMAL HIGH (ref 0.05–0.27)

## 2020-07-21 MED ORDER — GADOBUTROL 1 MMOL/ML IV SOLN
13.0000 mL | Freq: Once | INTRAVENOUS | Status: AC | PRN
  Administered 2020-07-21: 13 mL via INTRAVENOUS

## 2020-07-21 MED ORDER — IOHEXOL 350 MG/ML SOLN
75.0000 mL | Freq: Once | INTRAVENOUS | Status: AC | PRN
  Administered 2020-07-21: 75 mL via INTRAVENOUS

## 2020-07-21 MED ORDER — INSULIN GLARGINE 100 UNIT/ML ~~LOC~~ SOLN
25.0000 [IU] | Freq: Every day | SUBCUTANEOUS | Status: DC
  Administered 2020-07-21: 25 [IU] via SUBCUTANEOUS
  Filled 2020-07-21 (×2): qty 0.25

## 2020-07-21 MED ORDER — CLOPIDOGREL BISULFATE 75 MG PO TABS
75.0000 mg | ORAL_TABLET | Freq: Every day | ORAL | Status: DC
  Administered 2020-07-22: 75 mg via ORAL
  Filled 2020-07-21: qty 1

## 2020-07-21 MED ORDER — ATORVASTATIN CALCIUM 80 MG PO TABS
80.0000 mg | ORAL_TABLET | Freq: Every day | ORAL | Status: DC
  Administered 2020-07-21: 80 mg via ORAL
  Filled 2020-07-21: qty 1

## 2020-07-21 MED ORDER — INSULIN ASPART 100 UNIT/ML ~~LOC~~ SOLN
0.0000 [IU] | Freq: Three times a day (TID) | SUBCUTANEOUS | Status: DC
  Administered 2020-07-21: 3 [IU] via SUBCUTANEOUS

## 2020-07-21 MED ORDER — INSULIN GLARGINE 100 UNIT/ML ~~LOC~~ SOLN
19.0000 [IU] | Freq: Every day | SUBCUTANEOUS | Status: DC
  Administered 2020-07-21: 19 [IU] via SUBCUTANEOUS
  Filled 2020-07-21 (×2): qty 0.19

## 2020-07-21 MED ORDER — INSULIN ASPART 100 UNIT/ML ~~LOC~~ SOLN
0.0000 [IU] | Freq: Three times a day (TID) | SUBCUTANEOUS | Status: DC
  Administered 2020-07-21 (×2): 8 [IU] via SUBCUTANEOUS
  Administered 2020-07-22: 11 [IU] via SUBCUTANEOUS
  Administered 2020-07-22: 8 [IU] via SUBCUTANEOUS

## 2020-07-21 MED ORDER — FLUCONAZOLE 150 MG PO TABS
150.0000 mg | ORAL_TABLET | Freq: Once | ORAL | Status: AC
  Administered 2020-07-21: 150 mg via ORAL
  Filled 2020-07-21: qty 1

## 2020-07-21 MED ORDER — POTASSIUM CHLORIDE CRYS ER 20 MEQ PO TBCR
40.0000 meq | EXTENDED_RELEASE_TABLET | Freq: Once | ORAL | Status: AC
  Administered 2020-07-21: 40 meq via ORAL
  Filled 2020-07-21: qty 2

## 2020-07-21 NOTE — Progress Notes (Signed)
Midline consult: Met with patient, no current IV meds. Pt has 2 PIV in L arm. Pt stated she is tired of having venipuncture. Explained midline procedure, informing pt it is more invasive than lab draw. Labs due 8/18 0500. Pt agreeable to having phlebotomy draw. Will make RN aware.

## 2020-07-21 NOTE — Progress Notes (Addendum)
Inpatient Diabetes Program Recommendations  AACE/ADA: New Consensus Statement on Inpatient Glycemic Control (2015)  Target Ranges:  Prepandial:   less than 140 mg/dL      Peak postprandial:   less than 180 mg/dL (1-2 hours)      Critically ill patients:  140 - 180 mg/dL   Results for Darlene Bennett, Darlene Bennett (MRN [id number]) as of 07/21/2020 07:55  Ref. Range 07/20/2020 10:09 07/20/2020 12:59 07/20/2020 14:25 07/20/2020 15:02 07/20/2020 15:35 07/20/2020 17:22 07/20/2020 18:53 07/20/2020 20:37 07/20/2020 22:46 07/21/2020 00:00 07/21/2020 02:07 07/21/2020 04:03 07/21/2020 05:45  Glucose-Capillary Latest Ref Range: 70 - 99 mg/dL >600 (HH) >600 (HH)  IV Insulin Drip Started at 1:45pm 540 (HH)  IV Insulin Drip 413 (H)  IV Insulin Drip 395 (H)  IV Insulin Drip 247 (H)  IV Insulin Drip 173 (H)  IV Insulin Drip 185 (H)  IV Insulin Drip 165 (H)  IV Insulin Drip 214 (H)  IV Insulin Drip 191 (H)  IV Insulin Drip  19 units LANTUS given at 2:20am 195 (H)  IV Insulin Drip Stopped 195 (H)   Results for Darlene Bennett, Darlene Bennett (MRN [id number]) as of 07/21/2020 07:55  Ref. Range 05/11/2020 11:28  HbA1c, POC (controlled diabetic range) Latest Ref Range: 0.0 - 7.0 % 6.8   Results for Darlene Bennett, Darlene Bennett (MRN [id number]) as of 07/21/2020 12:34  Ref. Range 07/21/2020 05:56  Hemoglobin A1C Latest Ref Range: 4.8 - 5.6 % 13.5 (H)   Admit with: Severe Hyperglycemia/ Slurred Speech (CVA versus TIA)  History: DM  Home DM Meds: Synjardy 04-999 mg 1 tablet BID  Current Orders: Lantus 19 units QHS      Novolog Sensitive Correction Scale/ SSI (0-9 units) TID AC     Current A1c pending.  Transitioned to SQ Insulin early this AM.  Per MD notes, pt reports poor compliance recently with her oral DM meds.   Addendum 12pm--Met w/ pt down in the ED.  Reviewed all current treatments and admission glucose.  Discussed with patient diagnosis of Severe Hyperglycemia (pathophysiology), treatment with IVF and IV Insulin drip, lab results, and  transition plan to SQ insulin regimen.  Pt admitted to non-adherence with Synjardy.  Unable to tell me how often she misses doses.  Explained to pt that we have current A1c pending and that if it's high she will likely need insulin for home.  Pt told me she has used insulin pens in the past.  Was able to correctly demonstrate how to use the insulin pen with my demo pen this afternoon.  Re-reviewed rotation of injection sites and storage of insulin.  Pt did not have any questions about taking insulin.  Stated she has used Lantus in the past in pen form.  Has CBG meter at home and has Medicaid show should be able to get Lantus and Novolog with no issues.     --Will follow patient during hospitalization--  Wyn Quaker RN, MSN, CDE Diabetes Coordinator Inpatient Glycemic Control Team Team Pager: [phone] (8a-5p)

## 2020-07-21 NOTE — Consult Note (Addendum)
Neurology Consultation  Reason for Consult: Intermittent slurred speech with stroke visualized on MRI Referring Physician: Dr. Andria Frames  CC: Intermittent slurred speech  History is obtained from: Patient  HPI: Darlene Bennett is a 44 y.o. female with history of hypertension, gestational diabetes, diabetes, hyperlipidemia.  Patient came to the hospital today due to multiple days of intermittent slurred speech.  She states the first event occurred on Saturday at approximately 3 PM.  She noticed that she was having slurred speech so she lie down to take a nap.  She woke up at approximately 6 PM and the speech was normal.  The next day patient states that at approximately the same time on Sunday she noted that she had some dysarthria which lasted for approximately 3 hours and then dissipated.  Patient went to sleep at 11 PM last night feeling normal however when she woke up this morning she noted again she was having slurred speech thus came to the hospital.  By the time she was in the waiting room and cleared and she has had no further incidences.  Patient states that she does take an 81 mg aspirin daily and has not missed any doses.  She admits to smoking and also states that she does not do any illicit drugs.  Currently she feels back to her baseline.  Patient states that she takes good control of her diabetes however her A1c was 13.5.  In addition her LDL is 192 even though she states she is taking good control of her cholesterol.  She states that she does not know any family members with any clotting disorders and/or sickle cell.  LKW: 11 pm hours 8/16 tpa given?: no, out of window Premorbid modified Rankin scale (mRS): 0  NIHSS 1a Level of Conscious.: 0 1b LOC Questions: 0 1c LOC Commands: 0 2 Best Gaze: 0 3 Visual: 0 4 Facial Palsy: 0 5a Motor Arm - left: 1 5b Motor Arm - Right: 0 6a Motor Leg - Left: 0 6b Motor Leg - Right: 0 7 Limb Ataxia: 0 8 Sensory: 0 9 Best Language: 0 10  Dysarthria: 0 11 Extinct. and Inatten.: 0 TOTAL: 1   Past Medical History:  Diagnosis Date  . Gestational diabetes    insulin  . Gestational diabetes mellitus, antepartum 02/14/2013  . History of gestational diabetes   . Hypertension    preeclampsia with previous pregnancy  . Scalp cyst     Family History  Problem Relation Age of Onset  . Heart disease Mother   . Hypertension Mother   . Diabetes Mother   . Hypertension Father   . Diabetes Father    Social History:   reports that she has been smoking cigarettes. She has a 5.50 pack-year smoking history. She has never used smokeless tobacco. She reports that she does not drink alcohol and does not use drugs.  Medications  Current Facility-Administered Medications:  .  aspirin EC tablet 81 mg, 81 mg, Oral, Daily, Simmons-Robinson, Makiera, MD, 81 mg at 07/21/20 1009 .  atorvastatin (LIPITOR) tablet 80 mg, 80 mg, Oral, Daily, Anderson, Hannah C, DO .  dextrose 50 % solution 0-50 mL, 0-50 mL, Intravenous, PRN, Simmons-Robinson, Makiera, MD .  enoxaparin (LOVENOX) injection 65 mg, 65 mg, Subcutaneous, Q24H, Simmons-Robinson, Makiera, MD .  insulin aspart (novoLOG) injection 0-15 Units, 0-15 Units, Subcutaneous, TID WC, Daisy Floro, DO, 8 Units at 07/21/20 1221 .  insulin glargine (LANTUS) injection 19 Units, 19 Units, Subcutaneous, QHS, Mullis, Kiersten P, DO, 19  Units at 07/21/20 0220 .  sodium chloride flush (NS) 0.9 % injection 3 mL, 3 mL, Intravenous, Once, Simmons-Robinson, Makiera, MD  Current Outpatient Medications:  .  ACCU-CHEK FASTCLIX LANCETS MISC, 1 each by Does not apply route 4 (four) times daily -  before meals and at bedtime. Check sugar 6 x daily, Disp: 204 each, Rfl: 3 .  aspirin EC 81 MG tablet, Take 1 tablet (81 mg total) by mouth daily., Disp: 90 tablet, Rfl: 3 .  atorvastatin (LIPITOR) 40 MG tablet, Take 1 tablet (40 mg total) by mouth daily., Disp: 90 tablet, Rfl: 3 .  Blood Glucose Monitoring Suppl  (ACCU-CHEK NANO SMARTVIEW) W/DEVICE KIT, 1 Device by Does not apply route 4 (four) times daily -  before meals and at bedtime., Disp: 1 kit, Rfl: 0 .  Empagliflozin-metFORMIN HCl (SYNJARDY) 04-999 MG TABS, Take 1 tablet by mouth 2 (two) times daily., Disp: 180 tablet, Rfl: 3 .  glucose blood test strip, Use to check blood sugar once daily or as directed., Disp: 30 each, Rfl: 5 .  hydrochlorothiazide (HYDRODIURIL) 25 MG tablet, Take 1 tablet (25 mg total) by mouth daily., Disp: 30 tablet, Rfl: 0 .  lisinopril (ZESTRIL) 40 MG tablet, Take 1 tablet (40 mg total) by mouth at bedtime., Disp: 90 tablet, Rfl: 3 .  medroxyPROGESTERone (DEPO-PROVERA) 150 MG/ML injection, Inject 1 mL (150 mg total) into the muscle every 3 (three) months., Disp: 1 mL, Rfl: 0  ROS:    General ROS: Positive for -dysarthria Psychological ROS: negative for - behavioral disorder, hallucinations, memory difficulties, mood swings or suicidal ideation Ophthalmic ROS: negative for - blurry vision, double vision, eye pain or loss of vision ENT ROS: negative for - epistaxis, nasal discharge, oral lesions, sore throat, tinnitus or vertigo Allergy and Immunology ROS: negative for - hives or itchy/watery eyes Hematological and Lymphatic ROS: negative for - bleeding problems, bruising or swollen lymph nodes Endocrine ROS: negative for - galactorrhea, hair pattern changes, polydipsia/polyuria or temperature intolerance Respiratory ROS: negative for - cough, hemoptysis, shortness of breath or wheezing Cardiovascular ROS: negative for - chest pain, dyspnea on exertion, edema or irregular heartbeat Gastrointestinal ROS: negative for - abdominal pain, diarrhea, hematemesis, nausea/vomiting or stool incontinence Genito-Urinary ROS: negative for - dysuria, hematuria, incontinence or urinary frequency/urgency Musculoskeletal ROS: negative for - joint swelling or muscular weakness Neurological ROS: as noted in HPI Dermatological ROS: negative  for rash and skin lesion changes  Exam: Current vital signs: BP 137/83   Pulse 76   Temp 99.7 F (37.6 C) (Oral)   Resp 18   Ht 6' (1.829 m)   Wt 129.7 kg   SpO2 98%   BMI 38.79 kg/m  Vital signs in last 24 hours: Pulse Rate:  [67-88] 76 (08/17 1216) Resp:  [13-23] 18 (08/17 1130) BP: (95-138)/(48-90) 137/83 (08/17 1230) SpO2:  [93 %-100 %] 98 % (08/17 1216)   Constitutional: Appears well-developed and well-nourished.  Psych: Affect appropriate to situation Eyes: No scleral injection HENT: No OP obstrucion Head: Normocephalic.  Cardiovascular: Normal rate and regular rhythm.  Respiratory: Effort normal, non-labored breathing GI: Soft.  No distension. There is no tenderness.  Skin: WDI  Neuro: Mental Status: Patient is awake alert and oriented.  He is able to give a good history.  Does not show any dysarthria or aphasia.  She is able to follow commands without difficulty.  At this point time she is very frustrated that she may have to stay in the hospital. Cranial Nerves: II:  Visual Fields are full.  III,IV, VI: EOMI without ptosis or diploplia. Pupils equal, round and reactive to light V: Facial sensation is symmetric to temperature VII: Facial movement is symmetric.  VIII: hearing is intact to voice X: Palat elevates symmetrically XI: Shoulder shrug is symmetric. XII: tongue is midline without atrophy or fasciculations.  Motor: 4/5 on the left arm and leg with mild drift on left arm otherwise 5/5. Sensory: Sensation is symmetric to light touch and temperature in the arms and legs. DSS Deep Tendon Reflexes: 2+ and symmetric in the biceps and patellae.  Plantars: Toes are downgoing bilaterally.  Cerebellar: FNF and HKS are intact bilaterally  Labs I have reviewed labs in epic and the results pertinent to this consultation are:   CBC    Component Value Date/Time   WBC 8.2 07/21/2020 0556   RBC 4.68 07/21/2020 0556   HGB 13.6 07/21/2020 0556   HGB 14.5  09/04/2017 1042   HGB 13.5 10/02/2012 0000   HCT 40.1 07/21/2020 0556   HCT 42.6 09/04/2017 1042   HCT 40 10/02/2012 0000   PLT 266 07/21/2020 0556   PLT 347 09/04/2017 1042   PLT 272 10/02/2012 0000   MCV 85.7 07/21/2020 0556   MCV 86 09/04/2017 1042   MCH 29.1 07/21/2020 0556   MCHC 33.9 07/21/2020 0556   RDW 12.0 07/21/2020 0556   RDW 13.5 09/04/2017 1042   LYMPHSABS 2.2 07/20/2020 1022   MONOABS 0.4 07/20/2020 1022   EOSABS 0.0 07/20/2020 1022   BASOSABS 0.0 07/20/2020 1022    CMP     Component Value Date/Time   NA 139 07/21/2020 0556   NA 136 06/25/2020 0946   K 3.3 (L) 07/21/2020 0556   CL 107 07/21/2020 0556   CO2 22 07/21/2020 0556   GLUCOSE 205 (H) 07/21/2020 0556   GLUCOSE 130 (H) 02/12/2013 1057   BUN 10 07/21/2020 0556   BUN 17 06/25/2020 0946   CREATININE 1.06 (H) 07/21/2020 0556   CREATININE 0.91 03/09/2015 1143   CALCIUM 9.1 07/21/2020 0556   PROT 8.0 07/20/2020 1022   ALBUMIN 3.9 07/20/2020 1022   AST 15 07/20/2020 1022   ALT 18 07/20/2020 1022   ALKPHOS 117 07/20/2020 1022   BILITOT 0.8 07/20/2020 1022   GFRNONAA >60 07/21/2020 0556   GFRAA >60 07/21/2020 0556    Lipid Panel     Component Value Date/Time   CHOL 279 (H) 07/21/2020 0556   CHOL 221 (H) 10/24/2019 1407   TRIG 288 (H) 07/21/2020 0556   HDL 29 (L) 07/21/2020 0556   HDL 35 (L) 10/24/2019 1407   CHOLHDL 9.6 07/21/2020 0556   VLDL 58 (H) 07/21/2020 0556   LDLCALC 192 (H) 07/21/2020 0556   LDLCALC 164 (H) 10/24/2019 1407   Hemoglobin A1c 13.5  Imaging I have reviewed the images obtained:  CT-scan of the brain-foci of abnormal cortical/subcortical hypodensity within the mid to posterior right frontal lobe right frontal operculum and right parietal lobe/temporoparietal junction.  There is subtle cortical hyperdensity in the regions likely reflecting mild petechial hemorrhage.  MRI examination of the brain-patchy subacute infarction in the right MCA distribution with petechial  hemorrhage  CTA head and neck-positive for hemodynamically significant circumferential narrowing/highly tapered appearance of the distal cavernous right ICA.  No calcified plaque is identified.  The top differential considerations are intracranial atherosclerosis versus vasculitis.    Although negative for ICA or other large vessel occlusion, I do suspect 1 or more right MCA M3 or distal branch  occlusions.  No other hemodynamically significant intracranial stenosis, but there is mild irregularity also in the cavernous left ICA, the distal basilar artery, bilateral PCAs  Etta Quill PA-C [facility] [phone]  M-F  (9:00 am- 5:00 PM)  07/21/2020, 1:36 PM     Assessment:  This is a 44 year old female with embolic appearing infarcts.  She has significant atherosclerotic disease and therefore I suspect that this is likely artery to artery, but she will need telemetry/echoes embolic work-up as well.  In the short-term, I would favor dual antiplatelet therapy.  She will also need optimization of her vascular risk factors.    Recommend -Transthoracic Echo -Dual antiplatelet therapy for 3 weeks followed by monotherapy -Start or continue Atorvastatin 80 mg/other high intensity statin -BP goal: permissive HTN upto 220/120 mmHg -Telemetry monitoring -Frequent neuro checks -NPO until passes stroke swallow screen -PT/OT # please page stroke NP  Or  PA  Or MD from 8am -4 pm  as this patient from this time will be  followed by the stroke.   You can look them up on [email or web address]  Password [email or web address]   Roland Rack, MD [facility] [phone]  If 7pm- 7am, please page neurology on call as listed in [place].

## 2020-07-21 NOTE — ED Notes (Signed)
Ordered breakfast 

## 2020-07-21 NOTE — ED Notes (Signed)
Spoke to admitting Dr Ouida Sills Request IV consult d/t difficulty obtaining labs work. IV access present patent but unable to obtain labs. Pt stuck multiple times. Pending call from IV team

## 2020-07-21 NOTE — Progress Notes (Signed)
Family Medicine Teaching Service Daily Progress Note Intern Pager: [phone]  Patient name: Darlene Bennett Medical record number: [id number] Date of birth: [birth year] Age: 44 y.o. Gender: female  Primary Care Provider: Lattie Haw, MD Consultants: Neurology Code Status: Full code  Pt Overview and Major Events to Date:  8/16 - Admitted for aphasia  Assessment and Plan: Darlene Bennett is a 44 y.o. female presenting with reports of intermittent slurred speech. PMH is significant for T2DM, HTN, morbid obesity, chronic pain, HLD, tobacco use.  Intermittent dysarthria, acute, stable Patient admitted with slurred speech, aphasia x2 days.  Neuro consulted in the ED recommended MRI, which showed subacute right MCA infarction.  Neuro was reconsulted. Other than slurred speech, patient neurologically intact without focal deficits. Patient's home meds include ASA, atorvastatin 40 (strong suspicion patient is noncompliant due to elevated lipids), empagliflozin-metformin 5-1000mg , lisinopril, and HCTZ, patient reports regular adherence to medication regimen. -Neuro - recommending NPO until SLP eval, DAPT, permissive HTN -PT/OT -daily ASA  -Increased home Atorvastatin from 40 to 80mg  daily -AM CMP and CBC -Hold home BP meds for permissive hypertension  Hyperglycemic crisis, resolved  T2DM, improving Patient has Hx of T2DM.  HgbA1c 05/2020 was 6.8.  A1c this admission significantly increased at 13.5%. Patient currently on sSSI, Lantus 19u qHS. Home meds empagliflozin-metformin. Most recent CBG 238>287. BHAB 0.21>0.72. Patient reports poor compliance recently with diabetes medication. -f/u CBGs -Lantus 25qHS, mSSI -Continue mIVF LR -monitor electrolytes with BMP q4 hours, BHAB q4 hours  -transition to subcutaneous insulin once blood glucose below 200s for two consecutive measurements and BHB within normal range   Hypokalemia: K today 3.4 -Kdur 40 x1 -2pm BMP  AKI, prerenal, resolved Cr on admission  1.38, today 1.06 (8/17).  -Continue MIVF LR 125/h -f/u CMP AM  Vaginal Candida  Patient reports a rash in her genital area that she attributes to her elevated Blood glucose. U/A noted to have yeast.  -Diflucan 150mg  x1   FEN/GI: Carb modified,  PPx: Lovenox  Disposition: d/c home pending neuro work up  Subjective:  Patient seen resting in bed, would like to go home.   Objective: Temp:  [99.7 F (37.6 C)] 99.7 F (37.6 C) (08/16 1020) Pulse Rate:  [68-102] 84 (08/17 0805) Resp:  [13-23] 14 (08/17 0805) BP: (95-141)/(58-114) 138/71 (08/17 0805) SpO2:  [96 %-100 %] 100 % (08/17 0805) Weight:  [129.7 kg] 129.7 kg (08/16 1021) Physical Exam: General: nontoxic appearing  Cardiovascular: RRR S1S2 present, no murmurs Respiratory: comfortable work of breathing  Laboratory: Recent Labs  Lab 07/20/20 1022 07/20/20 1022 07/20/20 1353 07/20/20 1935 07/21/20 0556  WBC 7.2  --   --  8.6 8.2  HGB 14.0   < > 13.9  14.6 13.6 13.6  HCT 41.6   < > 41.0  43.0 40.1 40.1  PLT 305  --   --  275 266   < > = values in this interval not displayed.   Recent Labs  Lab 07/20/20 1022 07/20/20 1309 07/20/20 1545 07/20/20 1938 07/21/20 0556  NA 126*   < > 138 138 139  K 3.9   < > 3.3* 3.4* 3.3*  CL 92*   < > 104 104 107  CO2 20*   < > 24 24 22   BUN 10   < > 10 10 10   CREATININE 1.38*   < > 1.07* 1.07* 1.06*  CALCIUM 9.8   < > 9.4 9.6 9.1  PROT 8.0  --   --   --   --  BILITOT 0.8  --   --   --   --   ALKPHOS 117  --   --   --   --   ALT 18  --   --   --   --   AST 15  --   --   --   --   GLUCOSE 847*   < > 191* 183* 205*   < > = values in this interval not displayed.    Lipid Panel     Component Value Date/Time   CHOL 279 (H) 07/21/2020 0556   TRIG 288 (H) 07/21/2020 0556   HDL 29 (L) 07/21/2020 0556   CHOLHDL 9.6 07/21/2020 0556   VLDL 58 (H) 07/21/2020 0556   LDLCALC 192 (H) 07/21/2020 0556   BHAB: 0.21 > 0.72  Imaging/Diagnostic Tests: CT HEAD WO CONTRAST  (07/20/20): Foci of abnormal cortical/subcortical hypodensity within the mid to posterior right frontal lobe/right frontal operculum and right parietal lobe/temporoparietal junction, as described and likely reflecting early subacute infarcts. Brain MRI is recommended for further evaluation and to exclude alternative etiologies (i.e. masses with associated parenchymal edema). There is subtle cortical hyperdensity in these regions likely reflecting mild petechial hemorrhage. No significant mass effect. 5 mm dural-based focus of calcification within the right cerebellopontine angle. This is nonspecific, although a tiny incidental calcified meningioma is difficult to exclude. Mild ethmoid sinus mucosal thickening.   MR Brain W and Wo Contrast (8/17): Patchy subacute infarct in the right MCA distribution with petechial hemorrhage.    Daisy Floro, DO 07/21/2020, 8:20 AM PGY-3, [facility] Intern pager: [phone], text pages welcome

## 2020-07-21 NOTE — ED Notes (Addendum)
Beta is to be drawn every Q6 hours. Drawn at 3pm then again at 9pm.

## 2020-07-21 NOTE — ED Notes (Signed)
Dinner ordered 

## 2020-07-21 NOTE — ED Notes (Signed)
Pt refusing lab draw.

## 2020-07-21 NOTE — Evaluation (Signed)
Physical Therapy Evaluation and Discharge Patient Details Name: Darlene Bennett MRN: [id number] DOB: 03-31-1976 Today's Date: 07/21/2020   History of Present Illness  Pt is a 44 y/o female admitted secondary to dysarthria. Found to have hyperglycemic crisis and MRI revealed R MCA infarct. PMH includes HTN and DM.   Clinical Impression  Patient evaluated by Physical Therapy with no further acute PT needs identified. All education has been completed and the patient has no further questions. Pt overall at a mod I to independent level with mobility tasks. Pt reports she is at baseline with mobility. Educated about "BE FAST" acronym in recognizing CVA symptoms. See below for any follow-up Physical Therapy or equipment needs. PT is signing off. Thank you for this referral. If needs change, please re-consult.     Follow Up Recommendations No PT follow up    Equipment Recommendations  None recommended by PT    Recommendations for Other Services       Precautions / Restrictions Precautions Precautions: None Restrictions Weight Bearing Restrictions: No      Mobility  Bed Mobility Overal bed mobility: Independent                Transfers Overall transfer level: Independent                  Ambulation/Gait Ambulation/Gait assistance: Modified independent (Device/Increase time) Gait Distance (Feet): 25 Feet Assistive device: None Gait Pattern/deviations: Step-through pattern     General Gait Details: Slower gait within ED room, however, no LOB noted. Pt reports she is at baseline.   Stairs Stairs: Yes       General stair comments: Verbal education about stair safety. Also had pt march in place to simulate stair navigation.   Wheelchair Mobility    Modified Rankin (Stroke Patients Only)       Balance Overall balance assessment: No apparent balance deficits (not formally assessed)                                           Pertinent  Vitals/Pain Pain Assessment: 0-10 Pain Score: 5  Pain Location: headache Pain Descriptors / Indicators: Headache    Home Living Family/patient expects to be discharged to:: Private residence Living Arrangements: Spouse/significant other;Children Available Help at Discharge: Family Type of Home: Apartment Home Access: Stairs to enter Entrance Stairs-Rails: None Technical brewer of Steps: 3-4 Home Layout: Two level Home Equipment: None      Prior Function Level of Independence: Independent               Hand Dominance   Dominant Hand: Left    Extremity/Trunk Assessment   Upper Extremity Assessment Upper Extremity Assessment: Overall WFL for tasks assessed    Lower Extremity Assessment Lower Extremity Assessment: Overall WFL for tasks assessed    Cervical / Trunk Assessment Cervical / Trunk Assessment: Normal  Communication   Communication: No difficulties  Cognition Arousal/Alertness: Awake/alert Behavior During Therapy: WFL for tasks assessed/performed Overall Cognitive Status: Within Functional Limits for tasks assessed                                        General Comments General comments (skin integrity, edema, etc.): Educated about "BE FAST" Acronym in recognizing CVA symptoms.     Exercises  Assessment/Plan    PT Assessment Patent does not need any further PT services  PT Problem List         PT Treatment Interventions      PT Goals (Current goals can be found in the Care Plan section)  Acute Rehab PT Goals Patient Stated Goal: to go home PT Goal Formulation: With patient Time For Goal Achievement: 07/21/20 Potential to Achieve Goals: Good    Frequency     Barriers to discharge        Co-evaluation               AM-PAC PT "6 Clicks" Mobility  Outcome Measure Help needed turning from your back to your side while in a flat bed without using bedrails?: None Help needed moving from lying on your back  to sitting on the side of a flat bed without using bedrails?: None Help needed moving to and from a bed to a chair (including a wheelchair)?: None Help needed standing up from a chair using your arms (e.g., wheelchair or bedside chair)?: None Help needed to walk in hospital room?: None Help needed climbing 3-5 steps with a railing? : A Little 6 Click Score: 23    End of Session   Activity Tolerance: Patient tolerated treatment well Patient left: in bed;with call bell/phone within reach (on stretcher in ED ) Nurse Communication: Mobility status PT Visit Diagnosis: Other abnormalities of gait and mobility (R26.89)    Time: 0814-4818 PT Time Calculation (min) (ACUTE ONLY): 10 min   Charges:   PT Evaluation $PT Eval Low Complexity: 1 Low          Lou Miner, DPT  Acute Rehabilitation Services  Pager: [phone] Office: [phone]   Rudean Hitt 07/21/2020, 6:09 PM

## 2020-07-21 NOTE — Hospital Course (Addendum)
Darlene Bennett is a 44 y.o. female presenting with reports of intermittent slurred speech. PMH is significant for T2DM, HTN, morbid obesity, chronic pain, HLD, tobacco use.   Intermittent dysarthria Patient presented with reports of intermittent dysarthria.  CT head showed foci of abnormal cortical/subcortical hypodensity within the mid to posterior right frontal lobe/right frontal operculum and right parietal lobe/temporoparietal junction.  There is also subtle cortical hyperdensity in these regions likely reflecting mild petechial hemorrhage.  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  Neurology consulted and recommended CTA neck and head.  CTA head positive for hemodynamically significant for circumferential narrowing/highly tapered appearance of distal cavernous the right ICA.  CTA neck was negative. Patient placed on DAPT (ASA 81 and clopidogrel 75) for 3 weeks followed by monotherapy and atorvastatin increased to 80 mg.  TTE was ordered and negative for significant abnormalities.  Neuro also order hypercoagulable labs pending at time of discharge.  PT/OT saw patient and did not recommend follow-up.  T2 DM diabetes w/ hyperosmolarity w/o coma BGL at presentation 847.  Patient started on insulin gtt and DKA protocol; however, AG was not elevated.  ABG was 7.3.  BHB was 0.6 and trended downward.  UA was negative for ketones. Patient transitioned from insulin GTT and started on subcutaneous insulin that was titrated as appropriate. Patient was discharged on 30 units of insulin daily.   Hypokalemia Patient presented with potassium 3.9 that decreased to 3.4 >3.3 in ED.  Patient received 21 M EQ potassium and hypokalemia resolved.  AKI, prerenal Cr on admission 1.38 decreased to 1.08 with fluids.  Patient received maintenance D5 W and maintenance LR.  Vaginal Candida Patient treated with 2 doses of Diflucan 150 mg.  Patient reported  improvement.

## 2020-07-22 ENCOUNTER — Encounter (HOSPITAL_COMMUNITY): Payer: Self-pay | Admitting: Family Medicine

## 2020-07-22 ENCOUNTER — Inpatient Hospital Stay (HOSPITAL_COMMUNITY): Payer: Medicaid Other

## 2020-07-22 DIAGNOSIS — I6389 Other cerebral infarction: Secondary | ICD-10-CM

## 2020-07-22 DIAGNOSIS — I63511 Cerebral infarction due to unspecified occlusion or stenosis of right middle cerebral artery: Secondary | ICD-10-CM

## 2020-07-22 DIAGNOSIS — R739 Hyperglycemia, unspecified: Secondary | ICD-10-CM

## 2020-07-22 DIAGNOSIS — R4781 Slurred speech: Secondary | ICD-10-CM

## 2020-07-22 LAB — BASIC METABOLIC PANEL
Anion gap: 12 (ref 5–15)
BUN: 13 mg/dL (ref 6–20)
CO2: 19 mmol/L — ABNORMAL LOW (ref 22–32)
Calcium: 9.1 mg/dL (ref 8.9–10.3)
Chloride: 103 mmol/L (ref 98–111)
Creatinine, Ser: 1.08 mg/dL — ABNORMAL HIGH (ref 0.44–1.00)
GFR calc Af Amer: >60 mL/min (ref >60)
GFR calc non Af Amer: >60 mL/min (ref >60)
Glucose, Bld: 299 mg/dL — ABNORMAL HIGH (ref 70–99)
Potassium: 3.5 mmol/L (ref 3.5–5.1)
Sodium: 134 mmol/L — ABNORMAL LOW (ref 135–145)

## 2020-07-22 LAB — ECHOCARDIOGRAM COMPLETE
Area-P 1/2: 4.21 cm2
Height: 72 in
S' Lateral: 3.9 cm
Weight: 4576 oz

## 2020-07-22 LAB — C-REACTIVE PROTEIN: CRP: 0.8 mg/dL (ref <1.0)

## 2020-07-22 LAB — BETA-HYDROXYBUTYRIC ACID: Beta-Hydroxybutyric Acid: 0.52 mmol/L — ABNORMAL HIGH (ref 0.05–0.27)

## 2020-07-22 LAB — GLUCOSE, CAPILLARY
Glucose-Capillary: 274 mg/dL — ABNORMAL HIGH (ref 70–99)
Glucose-Capillary: 302 mg/dL — ABNORMAL HIGH (ref 70–99)
Glucose-Capillary: 296 mg/dL — ABNORMAL HIGH (ref 70–99)

## 2020-07-22 LAB — SEDIMENTATION RATE: Sed Rate: 71 mm/hr — ABNORMAL HIGH (ref 0–22)

## 2020-07-22 LAB — ANTITHROMBIN III: AntiThromb III Func: 117 % (ref 75–120)

## 2020-07-22 MED ORDER — ASPIRIN 81 MG PO TBEC: 81.0000 mg | DELAYED_RELEASE_TABLET | Freq: Every day | ORAL | 1 refills | Status: AC

## 2020-07-22 MED ORDER — FLUCONAZOLE 150 MG PO TABS: 150.0000 mg | ORAL_TABLET | Freq: Once | ORAL | Status: DC

## 2020-07-22 MED ORDER — CLOPIDOGREL BISULFATE 75 MG PO TABS
75.0000 mg | ORAL_TABLET | Freq: Every day | ORAL | 0 refills | Status: DC
Start: 2020-07-23 — End: 2020-09-17

## 2020-07-22 MED ORDER — ATORVASTATIN CALCIUM 80 MG PO TABS
80.0000 mg | ORAL_TABLET | Freq: Every day | ORAL | 1 refills | Status: DC
Start: 2020-07-22 — End: 2021-01-22

## 2020-07-22 MED ORDER — INSULIN GLARGINE 100 UNIT/ML ~~LOC~~ SOLN
30.0000 [IU] | Freq: Every day | SUBCUTANEOUS | Status: DC
  Filled 2020-07-22: qty 0.3

## 2020-07-22 MED ORDER — METFORMIN HCL 1000 MG PO TABS: 1000.0000 mg | ORAL_TABLET | Freq: Two times a day (BID) | ORAL | 1 refills | Status: DC

## 2020-07-22 MED ORDER — INSULIN GLARGINE 100 UNITS/ML SOLOSTAR PEN: 20.0000 [IU] | PEN_INJECTOR | Freq: Every day | SUBCUTANEOUS | 1 refills | Status: DC

## 2020-07-22 NOTE — Evaluation (Signed)
 Occupational Therapy Evaluation & Discharge Patient Details Name: Darlene Bennett MRN: [id number] DOB: 01/09/1976 Today's Date: 07/22/2020    History of Present Illness Pt is a 43 y/o female admitted secondary to dysarthria. Found to have hyperglycemic crisis and MRI revealed R MCA infarct. PMH includes HTN and DM.    Clinical Impression   PTA, pt lives with husband and young children. Pt completely Independent in all daily tasks with no use of AD. Pt presents now with symptoms resolved and back to baseline Independence. No LOB, weakness, dizziness, or safety concerns during activities. No further skilled OT services needed at this time. OT to sign off. Please re-consult if needs change.     Follow Up Recommendations  No OT follow up    Equipment Recommendations  None recommended by OT    Recommendations for Other Services       Precautions / Restrictions Precautions Precautions: None Restrictions Weight Bearing Restrictions: No      Mobility Bed Mobility Overal bed mobility: Independent                Transfers Overall transfer level: Independent Equipment used: None                  Balance Overall balance assessment: No apparent balance deficits (not formally assessed)                                         ADL either performed or assessed with clinical judgement   ADL Overall ADL's : Independent                                       General ADL Comments: Independent for LB dressing, hallway mobility without AD. Pt reports going to bathroom without assistance      Vision Baseline Vision/History: No visual deficits Patient Visual Report: No change from baseline Vision Assessment?: No apparent visual deficits     Perception     Praxis      Pertinent Vitals/Pain Pain Assessment: No/denies pain     Hand Dominance Left   Extremity/Trunk Assessment Upper Extremity Assessment Upper Extremity Assessment: Overall  WFL for tasks assessed   Lower Extremity Assessment Lower Extremity Assessment: Defer to PT evaluation   Cervical / Trunk Assessment Cervical / Trunk Assessment: Normal   Communication Communication Communication: No difficulties   Cognition Arousal/Alertness: Awake/alert Behavior During Therapy: WFL for tasks assessed/performed Overall Cognitive Status: Within Functional Limits for tasks assessed                                     General Comments  Pt able to look to B sides while walking without LOB, no dizziness reported. Able to quickly self correct to locate room.     Exercises     Shoulder Instructions      Home Living Family/patient expects to be discharged to:: Private residence Living Arrangements: Spouse/significant other;Children (53 y/o and 70 y/o children) Available Help at Discharge: Family Type of Home: Apartment Home Access: Stairs to enter Technical  of Steps: 3-4 Entrance Stairs-Rails: None Home Layout: Two level Alternate Level Stairs-Number of Steps: flight Alternate Level Stairs-Rails: Right Bathroom Shower/Tub: Teacher, early years/pre: Standard  Home Equipment: None          Prior Functioning/Environment Level of Independence: Independent                 OT Problem List:        OT Treatment/Interventions:      OT Goals(Current goals can be found in the care plan section) Acute Rehab OT Goals Patient Stated Goal: to go home OT Goal Formulation: All assessment and education complete, DC therapy  OT Frequency:     Barriers to D/C:            Co-evaluation              AM-PAC OT "6 Clicks" Daily Activity     Outcome Measure Help from another person eating meals?: None Help from another person taking care of personal grooming?: None Help from another person toileting, which includes using toliet, bedpan, or urinal?: None Help from another person bathing (including washing,  rinsing, drying)?: None Help from another person to put on and taking off regular upper body clothing?: None Help from another person to put on and taking off regular lower body clothing?: None 6 Click Score: 24   End of Session    Activity Tolerance: Patient tolerated treatment well Patient left: in bed;with call bell/phone within reach                   Time: 0814-4818 OT Time Calculation (min): 11 min Charges:  OT General Charges $OT Visit: 1 Visit OT Evaluation $OT Eval Low Complexity: 1 Low  Darlene Bennett, OTR/L  Darlene Bennett 07/22/2020, 7:58 AM

## 2020-07-22 NOTE — Plan of Care (Signed)

## 2020-07-22 NOTE — Progress Notes (Signed)
  Echocardiogram 2D Echocardiogram has been performed.  Darlene Bennett 07/22/2020, 12:51 PM

## 2020-07-22 NOTE — Progress Notes (Signed)
Family Medicine Teaching Service Daily Progress Note Intern Pager: [phone]  Patient name: Darlene Bennett Medical record number: [id number] Date of birth: 11/22/1976 Age: 44 y.o. Gender: female  Primary Care Provider: Lattie Haw, MD Consultants: Neurology Code Status: FULL  Pt Overview and Major Events to Date:  8/16 - Admitted for aphasia  Assessment and Plan: Chistine Smithis a 44 y.o.femalepresenting with reports of intermittent slurred speech. PMH is significant forT2DM, HTN, morbid obesity, chronic pain, HLD, tobacco use.  Intermittent dysarthria, acute, stable Patient admitted with slurred speech, aphasia x2 days.  Neuro recommends transthoracic echo, DUAP for 3 weeks followed by monotherapy, transitioned to atorvastatin 80, permissive HTN up to 220/120 mmHg, telemetry monitoring.  Patient passed swallow study.  BP 109/71-141/89, HR 67-84.  PT saw patient did not recommend follow-up.  Neurology order PTG mutation, protein S total, protein S activity, protein C total, protein C activity lupus anticoagulant, homocystine, factor V Leiden, cardiolipin antibodies, C-reactive protein, beta-2 glycoprotein, Antithrombin III, ANCA titers. -Neuro - recommending NPO until SLP eval, DAPT, permissive HTN -All neuro labs pending -OT - Start Plavix 75mg  -daily ASA, Plavix 75 -Continue atorvastatin 80 mg daily -AM CMP and CBC -Hold home BP meds for permissive hypertension -Follow-up TTE  Hyperglycemic crisis, resolved  T2DM, improving Patient has Hx of T2DM. HgbA1c6/2021 was 6.8. A1c this admission significantly increased at 13.5%. Patient currently on sSSI, Lantus 19u qHS. Home meds empagliflozin-metformin. Most recent CBG 238>287>299  BHAB 0.21>0.72>0.52. Patient reports poor compliance recently with diabetes medication. Na corrected 137.  -f/u CBGs -Lantus 25qHS, mSSI increase to 30 QHS -DiscontinuemIVF LR  Hypokalemia-resolved  K today 3.5  AKI, prerenal, resolved Cr on  admission 1.38, today 1.08 (8/17). -f/u CMP AM  Vaginal Candida-acute, improving Patient reports a rash in her genital area that she attributes to her elevated Blood glucose.  Received Diflucan 150x1. Continues to have yeast but reports minor improvement in burning with urination sensation. - Continue Diflucan 150 x 1    FEN/GI: Carb modified,  PPx: Lovenox  Disposition: d/c home pending neuro work up   Subjective:  Patient had no overnight events.  No complaints this a.m.  Objective: Temp:  [98.5 F (36.9 C)-98.8 F (37.1 C)] 98.8 F (37.1 C) (08/17 2117) Pulse Rate:  [67-84] 72 (08/17 2117) Resp:  [14-23] 18 (08/17 2117) BP: (109-141)/(48-92) 135/76 (08/17 2117) SpO2:  [93 %-100 %] 99 % (08/17 2117) Physical Exam: General: Resting in bed, NAD, appropriate mood and affect Cardiovascular: RRR, no murmur detected Respiratory: Clear and equal bilaterally, good work of breathing Abdomen: Normoactive bowel sounds, no tenderness to palpation Extremities: Distal pulses intact, no edema noted Neuro: Cranial nerves III through XII intact, 5/5 grip strength bilaterally, 5/5 bilateral UE, 5/5 bilateral LE, 5/5 bilateral dorsi and plantar flexion  Laboratory: Recent Labs  Lab 07/20/20 1022 07/20/20 1022 07/20/20 1353 07/20/20 1935 07/21/20 0556  WBC 7.2  --   --  8.6 8.2  HGB 14.0   < > 13.9  14.6 13.6 13.6  HCT 41.6   < > 41.0  43.0 40.1 40.1  PLT 305  --   --  275 266   < > = values in this interval not displayed.   Recent Labs  Lab 07/20/20 1022 07/20/20 1309 07/20/20 1545 07/20/20 1938 07/21/20 0556  NA 126*   < > 138 138 139  K 3.9   < > 3.3* 3.4* 3.3*  CL 92*   < > 104 104 107  CO2 20*   < >  24 24 22   BUN 10   < > 10 10 10   CREATININE 1.38*   < > 1.07* 1.07* 1.06*  CALCIUM 9.8   < > 9.4 9.6 9.1  PROT 8.0  --   --   --   --   BILITOT 0.8  --   --   --   --   ALKPHOS 117  --   --   --   --   ALT 18  --   --   --   --   AST 15  --   --   --   --    GLUCOSE 847*   < > 191* 183* 205*   < > = values in this interval not displayed.      Imaging/Diagnostic Tests: CT ANGIO HEAD W OR WO CONTRAST  Result Date: 07/21/2020 CLINICAL DATA:  44 year old female with slurred speech for several days. MRI earlier today demonstrating patchy right MCA distribution infarcts with petechial hemorrhage. EXAM: CT ANGIOGRAPHY HEAD AND NECK TECHNIQUE: Multidetector CT imaging of the head and neck was performed using the standard protocol during bolus administration of intravenous contrast. Multiplanar CT image reconstructions and MIPs were obtained to evaluate the vascular anatomy. Carotid stenosis measurements (when applicable) are obtained utilizing NASCET criteria, using the distal internal carotid diameter as the denominator. CONTRAST:  52mL OMNIPAQUE IOHEXOL 350 MG/ML SOLN COMPARISON:  Brain MRI earlier today.  Head CT yesterday. FINDINGS: CT HEAD Brain: Patchy hypodensity redemonstrated in the right MCA territories without significant change by CT since yesterday. Faint parenchymal hyperdensity likely corresponding to petechial hemorrhage (such as series 5, image 23). No malignant hemorrhagic transformation. No intracranial mass effect or ventriculomegaly. Stable left hemisphere, deep gray nuclei and posterior fossa gray-white matter differentiation. Delayed post-contrast images are also provided and demonstrate gyriform enhancement in the areas of right hemisphere involvement. No abnormal intracranial enhancement elsewhere. Calvarium and skull base: No acute osseous abnormality identified. Paranasal sinuses: Visualized paranasal sinuses and mastoids are stable and well pneumatized. Orbits: Visualized orbits and scalp soft tissues are within normal limits. CTA NECK Skeleton: Carious posterior right mandible molar with periapical lucency. Degenerative changes in the lower cervical spine. No other No acute osseous abnormality identified. Upper chest: Negative. Other  neck: Mild thyromegaly. Only subcentimeter thyroid nodules for which no follow-up is indicated. (Ref: J Am Coll Radiol. 2015 Feb;12(2): 143-50).Negative other neck soft tissues. Aortic arch: Bovine type arch configuration. No arch atherosclerosis. Right carotid system: Negative aside from a partially retropharyngeal course. No atherosclerosis or stenosis. Left carotid system: Bovine left CCA origin. Otherwise negative. No atherosclerosis or stenosis. Vertebral arteries: Proximal subclavian arteries and vertebral artery origins are normal. The right vertebral appears mildly dominant. The right vertebral has an unusual early origin of the PICA which is extracranial at the C1 level, and then continues across the dura (series 11, image 177). But both vertebrals are patent to the skull base without atherosclerosis or stenosis. CTA HEAD Posterior circulation: Fairly codominant distal vertebral arteries without plaque or stenosis. Normal vertebrobasilar junction. The left AICA appears dominant and patent. Patent basilar artery. There is mild distal basilar irregularity but no significant stenosis. Patent basilar tip, SCA and PCA origins. No PCA stenosis. There is mild bilateral PCA irregularity greater on the right. Anterior circulation: The left ICA siphon is patent, with a mild irregularity and narrowing in the distal cavernous segment near the anterior genu (series 13, image 119) although this does not appear hemodynamically significant. On the right side  the siphon is patent although diminutive, and with moderate to severe circumferential narrowing in the distal cavernous ICA (series 13, image 90 and series 11, image 116 although no calcified plaque is present. Both carotid termini are patent. The left ACA A1 segment is dominant. There is a small right A1 which is patent. The anterior communicating artery and bilateral ACA branches are within normal limits. The right MCA M1 and bifurcation are patent although  diminutive compared to the opposite MCA. No right MCA M2 branch occlusion is identified, although both anterior and posterior M3 branch occlusions are possible (series 16, image 15). The contralateral left MCA M1 and left MCA bifurcation are patent without stenosis. Left MCA branches are within normal limits. Venous sinuses: Early contrast timing, but the major dural venous sinuses appear to be patent. The right transverse and sigmoid appear dominant. Anatomic variants: Dominant left ACA A1. Review of the MIP images confirms the above findings IMPRESSION: 1. Positive for hemodynamically significant circumferential narrowing/highly tapered appearance of the distal cavernous Right ICA. No calcified plaque is identified. The top differential considerations are intracranial atherosclerosis versus vasculitis (see also #3). 2. Although negative for ICA or other large vessel occlusion, I do suspect one or more Right MCA M3 or distal branch occlusions. 3. No other hemodynamically significant intracranial stenosis, but there is mild irregularity also of the cavernous left ICA, the distal Basilar Artery, bilateral PCAs. 4. Negative Neck CTA findings.  No extracranial atherosclerosis. 5. Expected CT appearance of patchy right hemisphere infarcts with petechial hemorrhage seen by MRI today. No malignant hemorrhagic transformation or intracranial mass effect. No new intracranial abnormality. 6. Carious right mandible molar. Electronically Signed   By: Genevie Ann M.D.   On: 07/21/2020 10:41   CT ANGIO NECK W OR WO CONTRAST  Result Date: 07/21/2020 CLINICAL DATA:  44 year old female with slurred speech for several days. MRI earlier today demonstrating patchy right MCA distribution infarcts with petechial hemorrhage. EXAM: CT ANGIOGRAPHY HEAD AND NECK TECHNIQUE: Multidetector CT imaging of the head and neck was performed using the standard protocol during bolus administration of intravenous contrast. Multiplanar CT image  reconstructions and MIPs were obtained to evaluate the vascular anatomy. Carotid stenosis measurements (when applicable) are obtained utilizing NASCET criteria, using the distal internal carotid diameter as the denominator. CONTRAST:  42mL OMNIPAQUE IOHEXOL 350 MG/ML SOLN COMPARISON:  Brain MRI earlier today.  Head CT yesterday. FINDINGS: CT HEAD Brain: Patchy hypodensity redemonstrated in the right MCA territories without significant change by CT since yesterday. Faint parenchymal hyperdensity likely corresponding to petechial hemorrhage (such as series 5, image 23). No malignant hemorrhagic transformation. No intracranial mass effect or ventriculomegaly. Stable left hemisphere, deep gray nuclei and posterior fossa gray-white matter differentiation. Delayed post-contrast images are also provided and demonstrate gyriform enhancement in the areas of right hemisphere involvement. No abnormal intracranial enhancement elsewhere. Calvarium and skull base: No acute osseous abnormality identified. Paranasal sinuses: Visualized paranasal sinuses and mastoids are stable and well pneumatized. Orbits: Visualized orbits and scalp soft tissues are within normal limits. CTA NECK Skeleton: Carious posterior right mandible molar with periapical lucency. Degenerative changes in the lower cervical spine. No other No acute osseous abnormality identified. Upper chest: Negative. Other neck: Mild thyromegaly. Only subcentimeter thyroid nodules for which no follow-up is indicated. (Ref: J Am Coll Radiol. 2015 Feb;12(2): 143-50).Negative other neck soft tissues. Aortic arch: Bovine type arch configuration. No arch atherosclerosis. Right carotid system: Negative aside from a partially retropharyngeal course. No atherosclerosis or stenosis. Left carotid system:  Bovine left CCA origin. Otherwise negative. No atherosclerosis or stenosis. Vertebral arteries: Proximal subclavian arteries and vertebral artery origins are normal. The right  vertebral appears mildly dominant. The right vertebral has an unusual early origin of the PICA which is extracranial at the C1 level, and then continues across the dura (series 11, image 177). But both vertebrals are patent to the skull base without atherosclerosis or stenosis. CTA HEAD Posterior circulation: Fairly codominant distal vertebral arteries without plaque or stenosis. Normal vertebrobasilar junction. The left AICA appears dominant and patent. Patent basilar artery. There is mild distal basilar irregularity but no significant stenosis. Patent basilar tip, SCA and PCA origins. No PCA stenosis. There is mild bilateral PCA irregularity greater on the right. Anterior circulation: The left ICA siphon is patent, with a mild irregularity and narrowing in the distal cavernous segment near the anterior genu (series 13, image 119) although this does not appear hemodynamically significant. On the right side the siphon is patent although diminutive, and with moderate to severe circumferential narrowing in the distal cavernous ICA (series 13, image 90 and series 11, image 116 although no calcified plaque is present. Both carotid termini are patent. The left ACA A1 segment is dominant. There is a small right A1 which is patent. The anterior communicating artery and bilateral ACA branches are within normal limits. The right MCA M1 and bifurcation are patent although diminutive compared to the opposite MCA. No right MCA M2 branch occlusion is identified, although both anterior and posterior M3 branch occlusions are possible (series 16, image 15). The contralateral left MCA M1 and left MCA bifurcation are patent without stenosis. Left MCA branches are within normal limits. Venous sinuses: Early contrast timing, but the major dural venous sinuses appear to be patent. The right transverse and sigmoid appear dominant. Anatomic variants: Dominant left ACA A1. Review of the MIP images confirms the above findings IMPRESSION: 1.  Positive for hemodynamically significant circumferential narrowing/highly tapered appearance of the distal cavernous Right ICA. No calcified plaque is identified. The top differential considerations are intracranial atherosclerosis versus vasculitis (see also #3). 2. Although negative for ICA or other large vessel occlusion, I do suspect one or more Right MCA M3 or distal branch occlusions. 3. No other hemodynamically significant intracranial stenosis, but there is mild irregularity also of the cavernous left ICA, the distal Basilar Artery, bilateral PCAs. 4. Negative Neck CTA findings.  No extracranial atherosclerosis. 5. Expected CT appearance of patchy right hemisphere infarcts with petechial hemorrhage seen by MRI today. No malignant hemorrhagic transformation or intracranial mass effect. No new intracranial abnormality. 6. Carious right mandible molar. Electronically Signed   By: Genevie Ann M.D.   On: 07/21/2020 10:41    Freida Busman, MD 07/22/2020, 6:48 AM PGY-1, [place] Intern pager: [phone], text pages welcome

## 2020-07-22 NOTE — Progress Notes (Signed)
STROKE TEAM PROGRESS NOTE   INTERVAL HISTORY I have personally reviewed history of presenting illness with the patient, electronic medical records and imaging films in PACS.  MRI scan of the brain shows patchy right MCA infarct and CT angiogram shows right cavernous carotid stenosis with diminished flow but patent flow in the terminal right ICA and MCA branches.  2D echo is pending.  LDL cholesterol is elevated 192 mg percent hemoglobin A1c is 13.5.  Vitals:   07/21/20 1753 07/21/20 1814 07/21/20 2117 07/22/20 0824  BP:  (!) 141/89 135/76 121/63  Pulse: 71 80 72 73  Resp: 14 18 18 17   Temp:  98.5 F (36.9 C) 98.8 F (37.1 C) 98 F (36.7 C)  TempSrc:   Oral   SpO2: 100% 100% 99% 100%  Weight:      Height:       CBC:  Recent Labs  Lab 07/20/20 1022 07/20/20 1353 07/20/20 1935 07/21/20 0556  WBC 7.2   < > 8.6 8.2  NEUTROABS 4.5  --   --   --   HGB 14.0   < > 13.6 13.6  HCT 41.6   < > 40.1 40.1  MCV 85.2   < > 85.1 85.7  PLT 305   < > 275 266   < > = values in this interval not displayed.   Basic Metabolic Panel:  Recent Labs  Lab 07/21/20 0556 07/22/20 0536  NA 139 134*  K 3.3* 3.5  CL 107 103  CO2 22 19*  GLUCOSE 205* 299*  BUN 10 13  CREATININE 1.06* 1.08*  CALCIUM 9.1 9.1   Lipid Panel:  Recent Labs  Lab 07/21/20 0556  CHOL 279*  TRIG 288*  HDL 29*  CHOLHDL 9.6  VLDL 58*  LDLCALC 192*   HgbA1c:  Recent Labs  Lab 07/21/20 0556  HGBA1C 13.5*   Urine Drug Screen:  Recent Labs  Lab 07/20/20 1355  LABOPIA NONE DETECTED  COCAINSCRNUR NONE DETECTED  LABBENZ NONE DETECTED  AMPHETMU NONE DETECTED  THCU NONE DETECTED  LABBARB NONE DETECTED    Alcohol Level  Recent Labs  Lab 07/20/20 1330  ETH <10    IMAGING past 24 hours No results found.  PHYSICAL EXAM Pleasant obese African-American lady not in distress. . Afebrile. Head is nontraumatic. Neck is supple without bruit.    Cardiac exam no murmur or gallop. Lungs are clear to auscultation.  Distal pulses are well felt. Neurological Exam ;  Awake  Alert oriented x 3. Normal speech and language.eye movements full without nystagmus.fundi were not visualized. Vision acuity and fields appear normal. Hearing is normal. Palatal movements are normal. Face symmetric. Tongue midline. Normal strength, tone, reflexes and coordination except diminished fine finger movements on the left and orbits right over left upper extremity.. Normal sensation. Gait deferred.  ASSESSMENT/PLAN [name] is a 44 y.o. female with history of  hypertension, gestational diabetes, diabetes, hyperlipidemia presenting with 3 days intermittent slurred speech.   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 subacute cortical hyperdensity (petechial hemorrhage).   MRI  patchy R MCA infarcts w/ petechial hemorrhage   CTA head distal R cavernous narrowing. No LVO, possible R M3 occlusion. Irregular L ICA, distal BA, B PCAs.   CTA neck Unremarkable cary R mandible molar.  2D Echo  pending   LDL 192  HgbA1c 13.5  Hypercoagulable labs pending   aspirin 81 mg daily prior to admission, now on aspirin 81 mg daily and clopidogrel 75 mg daily. Continue DAPT x 3 months then plavix alone given large vessel disease  Therapy recommendations:  No therapy needs  Disposition:  Return home  Hypertension  Stable . Permissive hypertension (OK if < 220/120) but gradually normalize in 5-7 days . Long-term BP goal normotensive  Hyperlipidemia  Home meds:  lipitor 40  Now on lipitor 80  LDL 192, goal < 70  Continue statin at discharge  Diabetes type II Uncontrolled  Hx gestational diabetes  HgbA1c 13.5, goal < 7.0  CBGs Recent Labs    07/21/20 2348 07/22/20 0330 07/22/20 0825  GLUCAP 332* 274* 302*      SSI  DB  coordinator following, providing recommendations  Other Stroke Risk Factors  Cigarette smoker, advised to stop smoking  Obesity, Body mass index is 38.79 kg/m., recommend weight loss, diet and exercise as appropriate   Patient interested in participating in the Sleep Smart trial. Dr. Leonie Man will follow up for possible enrollment. Please contact him for any questions.    Other Active Problems  Hypokalemia  AKI, prerenal, resolved  Vaginal candida  Hospital day # 1 She presented with left-sided weakness due to a embolic right MCA infarct possibly from artery to artery embolus from right cavernous carotid stenosis and has multiple uncontrolled vascular risk factors.  Recommend aspirin Plavix for 3 months followed by aspirin alone and aggressive risk factor modification.  Patient appears to be at high risk for sleep apnea and was asked to consider possible participation in the sleep smart study but declined.  Discussed with patient and Dr. Ouida Sills family practice teaching service.  Greater than 50% time during the 35-minute visit was spent in counseling and coordination of care about her stroke intracranial stenosis and stroke risk factors and need for aggressive risk factor modification and answering questions. Antony Contras, MD To contact Stroke Continuity provider, please refer to [email or web address] After hours, contact General Neurology

## 2020-07-22 NOTE — Progress Notes (Addendum)
Inpatient Diabetes Program Recommendations  AACE/ADA: New Consensus Statement on Inpatient Glycemic Control   Target Ranges:  Prepandial:   less than 140 mg/dL      Peak postprandial:   less than 180 mg/dL (1-2 hours)      Critically ill patients:  140 - 180 mg/dL  Results for Darlene Bennett, Darlene Bennett (MRN [id number]) as of 07/22/2020 10:17  Ref. Range 07/21/2020 08:08 07/21/2020 11:52 07/21/2020 18:17 07/21/2020 19:50 07/21/2020 23:48 07/22/2020 03:30 07/22/2020 08:25  Glucose-Capillary Latest Ref Range: 70 - 99 mg/dL 238 (H) 287 (H) 286 (H) 300 (H) 332 (H) 274 (H) 302 (H)    Review of Glycemic Control  Diabetes history: DM2 Outpatient Diabetes medications: Synjardy 04-999 mg 1 tablet BID Current orders for Inpatient glycemic control: Lantus 25 units QHS, Novolog 0-15 units TID with meals  Inpatient Diabetes Program Recommendations:   Insulin-Please consider increasing Lantus to 30 units QHS, adding Novolog 0-5 units QHS, and ordering Novolog 5 units TID with meals for meal coverage if patient eats at least 50% of meals.  NOTE: Inpatient diabetes coordinator met with patient on 07/21/20 and patient reported she had used insulin pens in the past. If discharged on insulin, please prescribe insulin pens and insulin pen needles.  Thanks, Marie Byrd, RN, MSN, CDE Diabetes Coordinator Inpatient Diabetes Program [phone] (Team Pager from 8am to 5pm)     

## 2020-07-22 NOTE — Discharge Instructions (Signed)
Dear Darlene Bennett,   Thank you so much for allowing [place] to be part of your care!  You were admitted to [facility] for a stroke and elevated blood sugar.   In order to prevent another stroke, it is very important that you maintain tight control of your diabetes (as uncontrolled diabetes can make your blood vessels more likely to clot and cause other strokes), and to take your cholesterol-lowering medication every day as prescribed (high levels of cholesterol can also lead to more blood clots and strokes).  Instructions after discharge home from the hospital: -You have been started on aspirin 81 mg and Plavix 75 mg.  These are to be taken together every day.  This regimen is called "dual antiplatelet therapy" and is meant to thin the blood enough to prevent further strokes.  You will take these medicines together for 21 days, after which you will only continue on 1 tablet of aspirin 81 mg daily. -Diabetes: Your HbA1c was significantly elevated this hospitalization at 13.5%.  Ideally, your HbA1c should be less than 7%.  Begin insulin injections to help control your blood sugar. You will inject 20 units of Lantus daily into the skin every night.  -Continue 1 tablet of Metformin 1000mg  two times daily (one tablet in the morning, one tablet at bed time). -We have scheduled you for a follow-up appointment this Friday, 07/24/2020 at 2:40 PM at the [facility] family practice center ([address] -In September you are due for another Depo shot.  We strongly recommend that you consider having a copper IUD inserted instead.  A copper IUD kills sperm to prevent pregnancy.  It is ideal that women who have had strokes stay away from forms of hormonal birth control, such as the Depo shot, Nexplanon, and oral contraceptive pills. -We also strongly recommend that you get a sleep study in the outpatient setting. Please contact your family medicine doctor about obtaining this (it has been found that sleep  apnea can lead to strokes).    DOCTOR'S APPOINTMENT & FOLLOW UP CARE INSTRUCTIONS  Future Appointments  Date Time Provider [place]  07/24/2020  2:45 PM Ezequiel Essex, MD [facility] [place]    RETURN PRECAUTIONS: Please not hesitate to reach out to the clinic [phone]) or call 911 should you develop difficulty speaking, facial droop, or the inability to move or feel a limb.  Take care and be well!   [facility]  [place], [place] [address] [phone]

## 2020-07-23 LAB — CARDIOLIPIN ANTIBODIES, IGG, IGM, IGA
Anticardiolipin IgA: <9 APL U/mL (ref 0–11)
Anticardiolipin IgG: <9 GPL U/mL (ref 0–14)
Anticardiolipin IgM: <9 MPL U/mL (ref 0–12)

## 2020-07-23 LAB — ANCA TITERS
Atypical P-ANCA titer: <1:20 {titer}
C-ANCA: <1:20 {titer}
P-ANCA: <1:20 {titer}

## 2020-07-23 LAB — PROTEIN S, TOTAL: Protein S Ag, Total: 89 % (ref 60–150)

## 2020-07-23 LAB — PROTEIN C ACTIVITY: Protein C Activity: 135 % (ref 73–180)

## 2020-07-23 LAB — PROTEIN S ACTIVITY: Protein S Activity: 41 % — ABNORMAL LOW (ref 63–140)

## 2020-07-23 NOTE — Discharge Summary (Addendum)
[facility] Discharge Summary  Patient name: Darlene Bennett Medical record number: [id number] Date of birth: July 25, 1976 Age: 44 y.o. Gender: female Date of Admission: 07/20/2020  Date of Discharge: 07/22/2020 Admitting Physician: Zenia Resides, MD  Primary Care Provider: Lattie Haw, MD Consultants: Neurology  Indication for Hospitalization: Intermittent dysarthria acute onset  Discharge Diagnoses/Problem List:  Intermittent dysarthria T2 DM diabetes w/ hyperosmolarity w/o coma AKI, prerenal Hypokalemia Vaginal candidiasis  Disposition: Home  Discharge Condition: Stable  Discharge Exam:  General: Resting in bed, NAD, appropriate mood and affect Cardiovascular: RRR, no murmur detected Respiratory: Clear and equal bilaterally, good work of breathing Abdomen: Normoactive bowel sounds, no tenderness to palpation Extremities: Distal pulses intact, no edema noted Neuro: Cranial nerves III through XII intact, 5/5 grip strength bilaterally, 5/5 bilateral UE, 5/5 bilateral LE, 5/5 bilateral dorsi and plantar flexion  Brief Hospital Course:  Darlene Bennett is a 44 y.o. female presenting with reports of intermittent slurred speech. PMH is significant for T2DM, HTN, morbid obesity, chronic pain, HLD, tobacco use.   Intermittent dysarthria Patient presented with reports of intermittent dysarthria.  CT head showed foci of abnormal cortical/subcortical hypodensity within the mid to posterior right frontal lobe/right frontal operculum and right parietal lobe/temporoparietal junction.  There is also subtle cortical hyperdensity in these regions likely reflecting mild petechial hemorrhage.  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  Neurology consulted and recommended CTA neck and head.  CTA head positive for hemodynamically significant for circumferential narrowing/highly tapered appearance of distal cavernous  the right ICA.  CTA neck was negative. Patient placed on DAPT (ASA 81 and clopidogrel 75) for 3 weeks followed by monotherapy and atorvastatin increased to 80 mg.  TTE was ordered and negative for significant abnormalities.  Neuro also order hypercoagulable labs pending at time of discharge.  PT/OT saw patient and did not recommend follow-up.  T2 DM diabetes w/ hyperosmolarity w/o coma BGL at presentation 847.  Patient started on insulin gtt and DKA protocol; however, AG was not elevated.  ABG was 7.3.  BHB was 0.6 and trended downward.  UA was negative for ketones. Patient transitioned from insulin GTT and started on subcutaneous insulin that was titrated as appropriate. Patient was discharged on 30 units of insulin daily.   Hypokalemia Patient presented with potassium 3.9 that decreased to 3.4 >3.3 in ED.  Patient received 71 M EQ potassium and hypokalemia resolved.  AKI, prerenal Cr on admission 1.38 decreased to 1.08 with fluids.  Patient received maintenance D5 W and maintenance LR.  Vaginal Candida Patient treated with 2 doses of Diflucan 150 mg.  Patient reported improvement.        Issues for Follow Up:  F/U recommendations 1. Follow-up with PCP concerning finalizing diabetes medication regimen.  HgbA1c 13.5 this admission patient reported sudden noncompliance with diabetes regimen. 2. Follow-up with PCP regarding patient understanding compliance of new DAPT: dual therapy for 3 weeks followed by ASA 65m alone 3. Patient contraception was Depo Provera prior to CVA however this is contraindicated post CVA. Recommend copper IUD.  4. Recommend smoking cessation to decrease stroke risk.   5. Follow-up with PCP concerning improvement with vaginal dialysis infection 6. Follow-up with PCP concerning hypercoagulability labs pending at discharge. 7. Per neurology note patient voiced interest participating sleep Smart trial contact Dr. SLeonie Manfor possible enrollment if patient remains  interested  Significant Procedures:  TTE 07/22/2020: IMPRESSIONS  1. Left ventricular ejection fraction, by estimation,  is 60 to 65%. The  left ventricle has normal function. The left ventricle has no regional  wall motion abnormalities. Left ventricular diastolic parameters were  normal.  2. Right ventricular systolic function is normal. The right ventricular  size is normal. There is normal pulmonary artery systolic pressure.  3. The mitral valve is normal in structure. No evidence of mitral valve  regurgitation. No evidence of mitral stenosis.  4. The aortic valve is normal in structure. Aortic valve regurgitation is  not visualized. No aortic stenosis is present.  5. The inferior vena cava is normal in size with greater than 50%  respiratory variability, suggesting right atrial pressure of 3 mmHg.   Significant Labs and Imaging:  Recent Labs  Lab 07/20/20 1022 07/20/20 1022 07/20/20 1353 07/20/20 1935 07/21/20 0556  WBC 7.2  --   --  8.6 8.2  HGB 14.0   < > 13.9  14.6 13.6 13.6  HCT 41.6   < > 41.0  43.0 40.1 40.1  PLT 305  --   --  275 266   < > = values in this interval not displayed.   Recent Labs  Lab 07/20/20 1022 07/20/20 1022 07/20/20 1309 07/20/20 1309 07/20/20 1353 07/20/20 1353 07/20/20 1545 07/20/20 1545 07/20/20 1938 07/20/20 1938 07/21/20 0556 07/22/20 0536  NA 126*   < > 131*   < > 134*  134*  --  138  --  138  --  139 134*  K 3.9   < > 3.9   < > 3.9  4.0   < > 3.3*   < > 3.4*   < > 3.3* 3.5  CL 92*   < > 93*   < > 97*  --  104  --  104  --  107 103  CO2 20*   < > 23  --   --   --  24  --  24  --  22 19*  GLUCOSE 847*   < > 691*   < > 690*  --  191*  --  183*  --  205* 299*  BUN 10   < > 11   < > 12  --  10  --  10  --  10 13  CREATININE 1.38*   < > 1.36*   < > 1.10*  --  1.07*  --  1.07*  --  1.06* 1.08*  CALCIUM 9.8   < > 9.8  --   --   --  9.4  --  9.6  --  9.1 9.1  ALKPHOS 117  --   --   --   --   --   --   --   --   --   --   --    AST 15  --   --   --   --   --   --   --   --   --   --   --   ALT 18  --   --   --   --   --   --   --   --   --   --   --   ALBUMIN 3.9  --   --   --   --   --   --   --   --   --   --   --    < > = values in this interval not displayed.  CT ANGIO HEAD W OR WO CONTRAST  Result Date: 07/21/2020 CLINICAL DATA:  44 year old female with slurred speech for several days. MRI earlier today demonstrating patchy right MCA distribution infarcts with petechial hemorrhage. EXAM: CT ANGIOGRAPHY HEAD AND NECK TECHNIQUE: Multidetector CT imaging of the head and neck was performed using the standard protocol during bolus administration of intravenous contrast. Multiplanar CT image reconstructions and MIPs were obtained to evaluate the vascular anatomy. Carotid stenosis measurements (when applicable) are obtained utilizing NASCET criteria, using the distal internal carotid diameter as the denominator. CONTRAST:  24m OMNIPAQUE IOHEXOL 350 MG/ML SOLN COMPARISON:  Brain MRI earlier today.  Head CT yesterday. FINDINGS: CT HEAD Brain: Patchy hypodensity redemonstrated in the right MCA territories without significant change by CT since yesterday. Faint parenchymal hyperdensity likely corresponding to petechial hemorrhage (such as series 5, image 23). No malignant hemorrhagic transformation. No intracranial mass effect or ventriculomegaly. Stable left hemisphere, deep gray nuclei and posterior fossa gray-white matter differentiation. Delayed post-contrast images are also provided and demonstrate gyriform enhancement in the areas of right hemisphere involvement. No abnormal intracranial enhancement elsewhere. Calvarium and skull base: No acute osseous abnormality identified. Paranasal sinuses: Visualized paranasal sinuses and mastoids are stable and well pneumatized. Orbits: Visualized orbits and scalp soft tissues are within normal limits. CTA NECK Skeleton: Carious posterior right mandible molar with periapical lucency.  Degenerative changes in the lower cervical spine. No other No acute osseous abnormality identified. Upper chest: Negative. Other neck: Mild thyromegaly. Only subcentimeter thyroid nodules for which no follow-up is indicated. (Ref: J Am Coll Radiol. 2015 Feb;12(2): 143-50).Negative other neck soft tissues. Aortic arch: Bovine type arch configuration. No arch atherosclerosis. Right carotid system: Negative aside from a partially retropharyngeal course. No atherosclerosis or stenosis. Left carotid system: Bovine left CCA origin. Otherwise negative. No atherosclerosis or stenosis. Vertebral arteries: Proximal subclavian arteries and vertebral artery origins are normal. The right vertebral appears mildly dominant. The right vertebral has an unusual early origin of the PICA which is extracranial at the C1 level, and then continues across the dura (series 11, image 177). But both vertebrals are patent to the skull base without atherosclerosis or stenosis. CTA HEAD Posterior circulation: Fairly codominant distal vertebral arteries without plaque or stenosis. Normal vertebrobasilar junction. The left AICA appears dominant and patent. Patent basilar artery. There is mild distal basilar irregularity but no significant stenosis. Patent basilar tip, SCA and PCA origins. No PCA stenosis. There is mild bilateral PCA irregularity greater on the right. Anterior circulation: The left ICA siphon is patent, with a mild irregularity and narrowing in the distal cavernous segment near the anterior genu (series 13, image 119) although this does not appear hemodynamically significant. On the right side the siphon is patent although diminutive, and with moderate to severe circumferential narrowing in the distal cavernous ICA (series 13, image 90 and series 11, image 116 although no calcified plaque is present. Both carotid termini are patent. The left ACA A1 segment is dominant. There is a small right A1 which is patent. The anterior  communicating artery and bilateral ACA branches are within normal limits. The right MCA M1 and bifurcation are patent although diminutive compared to the opposite MCA. No right MCA M2 branch occlusion is identified, although both anterior and posterior M3 branch occlusions are possible (series 16, image 15). The contralateral left MCA M1 and left MCA bifurcation are patent without stenosis. Left MCA branches are within normal limits. Venous sinuses: Early contrast timing, but the major dural venous sinuses appear to  be patent. The right transverse and sigmoid appear dominant. Anatomic variants: Dominant left ACA A1. Review of the MIP images confirms the above findings IMPRESSION: 1. Positive for hemodynamically significant circumferential narrowing/highly tapered appearance of the distal cavernous Right ICA. No calcified plaque is identified. The top differential considerations are intracranial atherosclerosis versus vasculitis (see also #3). 2. Although negative for ICA or other large vessel occlusion, I do suspect one or more Right MCA M3 or distal branch occlusions. 3. No other hemodynamically significant intracranial stenosis, but there is mild irregularity also of the cavernous left ICA, the distal Basilar Artery, bilateral PCAs. 4. Negative Neck CTA findings.  No extracranial atherosclerosis. 5. Expected CT appearance of patchy right hemisphere infarcts with petechial hemorrhage seen by MRI today. No malignant hemorrhagic transformation or intracranial mass effect. No new intracranial abnormality. 6. Carious right mandible molar. Electronically Signed   By: Genevie Ann M.D.   On: 07/21/2020 10:41   CT HEAD WO CONTRAST  Result Date: 07/20/2020 CLINICAL DATA:  Neuro deficit, acute, stroke suspected. Additional history provided: Patient reports intermittent slurred speech since Saturday. EXAM: CT HEAD WITHOUT CONTRAST TECHNIQUE: Contiguous axial images were obtained from the base of the skull through the vertex  without intravenous contrast. COMPARISON:  No pertinent prior exams are available for comparison. FINDINGS: Brain: There is abnormal cortical/subcortical hypodensity measuring 2.4 x 3.6 x 3.7 cm within the mid to posterior right frontal lobe and right frontal operculum (for instance as seen on series 8, image 21). There is subtle cortical hyperdensity in this region likely reflecting mild petechial hemorrhage. There is an additional focus of abnormal cortical/subcortical hypodensity within the right parietal lobe/temporoparietal junction measuring 2.2 x 2.3 x 3.2 cm (for instance as seen on series 3, image 23) (series 5, image 51). Subtle petechial hemorrhage is also questioned in this region. There is no significant mass effect. No midline shift. No extra-axial fluid collection is present. Cerebral volume is normal for age. There is a 5 mm focus of dural-based calcification within the right cerebellopontine angle, abutting the porous acusticus (series 4, image 22). This is nonspecific but a small calcified meningioma is difficult to definitively exclude. Vascular: No hyperdense vessel. Skull: No calvarial fracture. Sinuses/Orbits: Visualized orbits show no acute finding. Mild ethmoid sinus mucosal thickening. No significant mastoid effusion. These results were called by telephone at the time of interpretation on 07/20/2020 at 11:13 am to provider Carmin Muskrat , who verbally acknowledged these results. IMPRESSION: Foci of abnormal cortical/subcortical hypodensity within the mid to posterior right frontal lobe/right frontal operculum and right parietal lobe/temporoparietal junction, as described and likely reflecting early subacute infarcts. Brain MRI is recommended for further evaluation and to exclude alternative etiologies (i.e. masses with associated parenchymal edema). There is subtle cortical hyperdensity in these regions likely reflecting mild petechial hemorrhage. No significant mass effect. 5 mm  dural-based focus of calcification within the right cerebellopontine angle. This is nonspecific, although a tiny incidental calcified meningioma is difficult to exclude. Mild ethmoid sinus mucosal thickening. Electronically Signed   By: Kellie Simmering DO   On: 07/20/2020 11:13   CT ANGIO NECK W OR WO CONTRAST  Result Date: 07/21/2020 CLINICAL DATA:  44 year old female with slurred speech for several days. MRI earlier today demonstrating patchy right MCA distribution infarcts with petechial hemorrhage. EXAM: CT ANGIOGRAPHY HEAD AND NECK TECHNIQUE: Multidetector CT imaging of the head and neck was performed using the standard protocol during bolus administration of intravenous contrast. Multiplanar CT image reconstructions and MIPs were obtained to  evaluate the vascular anatomy. Carotid stenosis measurements (when applicable) are obtained utilizing NASCET criteria, using the distal internal carotid diameter as the denominator. CONTRAST:  81m OMNIPAQUE IOHEXOL 350 MG/ML SOLN COMPARISON:  Brain MRI earlier today.  Head CT yesterday. FINDINGS: CT HEAD Brain: Patchy hypodensity redemonstrated in the right MCA territories without significant change by CT since yesterday. Faint parenchymal hyperdensity likely corresponding to petechial hemorrhage (such as series 5, image 23). No malignant hemorrhagic transformation. No intracranial mass effect or ventriculomegaly. Stable left hemisphere, deep gray nuclei and posterior fossa gray-white matter differentiation. Delayed post-contrast images are also provided and demonstrate gyriform enhancement in the areas of right hemisphere involvement. No abnormal intracranial enhancement elsewhere. Calvarium and skull base: No acute osseous abnormality identified. Paranasal sinuses: Visualized paranasal sinuses and mastoids are stable and well pneumatized. Orbits: Visualized orbits and scalp soft tissues are within normal limits. CTA NECK Skeleton: Carious posterior right mandible molar  with periapical lucency. Degenerative changes in the lower cervical spine. No other No acute osseous abnormality identified. Upper chest: Negative. Other neck: Mild thyromegaly. Only subcentimeter thyroid nodules for which no follow-up is indicated. (Ref: J Am Coll Radiol. 2015 Feb;12(2): 143-50).Negative other neck soft tissues. Aortic arch: Bovine type arch configuration. No arch atherosclerosis. Right carotid system: Negative aside from a partially retropharyngeal course. No atherosclerosis or stenosis. Left carotid system: Bovine left CCA origin. Otherwise negative. No atherosclerosis or stenosis. Vertebral arteries: Proximal subclavian arteries and vertebral artery origins are normal. The right vertebral appears mildly dominant. The right vertebral has an unusual early origin of the PICA which is extracranial at the C1 level, and then continues across the dura (series 11, image 177). But both vertebrals are patent to the skull base without atherosclerosis or stenosis. CTA HEAD Posterior circulation: Fairly codominant distal vertebral arteries without plaque or stenosis. Normal vertebrobasilar junction. The left AICA appears dominant and patent. Patent basilar artery. There is mild distal basilar irregularity but no significant stenosis. Patent basilar tip, SCA and PCA origins. No PCA stenosis. There is mild bilateral PCA irregularity greater on the right. Anterior circulation: The left ICA siphon is patent, with a mild irregularity and narrowing in the distal cavernous segment near the anterior genu (series 13, image 119) although this does not appear hemodynamically significant. On the right side the siphon is patent although diminutive, and with moderate to severe circumferential narrowing in the distal cavernous ICA (series 13, image 90 and series 11, image 116 although no calcified plaque is present. Both carotid termini are patent. The left ACA A1 segment is dominant. There is a small right A1 which is  patent. The anterior communicating artery and bilateral ACA branches are within normal limits. The right MCA M1 and bifurcation are patent although diminutive compared to the opposite MCA. No right MCA M2 branch occlusion is identified, although both anterior and posterior M3 branch occlusions are possible (series 16, image 15). The contralateral left MCA M1 and left MCA bifurcation are patent without stenosis. Left MCA branches are within normal limits. Venous sinuses: Early contrast timing, but the major dural venous sinuses appear to be patent. The right transverse and sigmoid appear dominant. Anatomic variants: Dominant left ACA A1. Review of the MIP images confirms the above findings IMPRESSION: 1. Positive for hemodynamically significant circumferential narrowing/highly tapered appearance of the distal cavernous Right ICA. No calcified plaque is identified. The top differential considerations are intracranial atherosclerosis versus vasculitis (see also #3). 2. Although negative for ICA or other large vessel occlusion, I do suspect  one or more Right MCA M3 or distal branch occlusions. 3. No other hemodynamically significant intracranial stenosis, but there is mild irregularity also of the cavernous left ICA, the distal Basilar Artery, bilateral PCAs. 4. Negative Neck CTA findings.  No extracranial atherosclerosis. 5. Expected CT appearance of patchy right hemisphere infarcts with petechial hemorrhage seen by MRI today. No malignant hemorrhagic transformation or intracranial mass effect. No new intracranial abnormality. 6. Carious right mandible molar. Electronically Signed   By: Genevie Ann M.D.   On: 07/21/2020 10:41   MR Brain W and Wo Contrast  Result Date: 07/21/2020 CLINICAL DATA:  Slurred speech beginning on Saturday. EXAM: MRI HEAD WITHOUT AND WITH CONTRAST TECHNIQUE: Multiplanar, multiecho pulse sequences of the brain and surrounding structures were obtained without and with intravenous contrast.  CONTRAST:  55m GADAVIST GADOBUTROL 1 MMOL/ML IV SOLN COMPARISON:  Head CT from yesterday FINDINGS: Brain: Patchy weakly restricted diffusion along the right cerebral convexity affecting frontal and parietal lobes in a MCA distribution. These areas show patchy T1 hyperintensity and gradient hypointensity with enhancement. Pattern is most consistent with subacute infarcts. Nodule posterior to the right porous acousticus which is densely calcified by CT, nonenhancing by MRI, likely of nodular dural ossification or osteoma. No pre-existing infarct.  No mass, hydrocephalus, or atrophy. Vascular: Normal flow voids and vascular enhancements. Skull and upper cervical spine: Normal marrow signal Sinuses/Orbits: Prominent lacrimal glands but symmetric and homogeneously enhancing. IMPRESSION: Patchy subacute infarct in the right MCA distribution with petechial hemorrhage. Electronically Signed   By: JMonte FantasiaM.D.   On: 07/21/2020 05:08   ECHOCARDIOGRAM COMPLETE  Result Date: 07/22/2020    ECHOCARDIOGRAM REPORT   Patient Name:   KMADISON DIRENZODate of Exam: 07/22/2020 Medical Rec #:  [id number]  Height:       72.0 in Accession #:    [id number] Weight:       286.0 lb Date of Birth:  [birth year]   BSA:          2.480 m Patient Age:    89years    BP:           121/63 mmHg Patient Gender: F           HR:           68 bpm. Exam Location:  Inpatient Procedure: 2D Echo Indications:    stroke 434.91  History:        Patient has no prior history of Echocardiogram examinations.                 Risk Factors:Diabetes, Hypertension and Dyslipidemia.  Sonographer:    LJohny ChessReferring Phys: [email or web address] 1. Left ventricular ejection fraction, by estimation, is 60 to 65%. The left ventricle has normal function. The left ventricle has no regional wall motion abnormalities. Left ventricular diastolic parameters were normal.  2. Right ventricular systolic function is normal. The right ventricular size is  normal. There is normal pulmonary artery systolic pressure.  3. The mitral valve is normal in structure. No evidence of mitral valve regurgitation. No evidence of mitral stenosis.  4. The aortic valve is normal in structure. Aortic valve regurgitation is not visualized. No aortic stenosis is present.  5. The inferior vena cava is normal in size with greater than 50% respiratory variability, suggesting right atrial pressure of 3 mmHg. FINDINGS  Left Ventricle: Left ventricular ejection fraction, by estimation, is 60 to 65%. The left ventricle has normal function.  The left ventricle has no regional wall motion abnormalities. The left ventricular internal cavity size was normal in size. There is  no left ventricular hypertrophy. Left ventricular diastolic parameters were normal. Right Ventricle: The right ventricular size is normal. No increase in right ventricular wall thickness. Right ventricular systolic function is normal. There is normal pulmonary artery systolic pressure. The tricuspid regurgitant velocity is 2.10 m/s, and  with an assumed right atrial pressure of 3 mmHg, the estimated right ventricular systolic pressure is 40.0 mmHg. Left Atrium: Left atrial size was normal in size. Right Atrium: Right atrial size was normal in size. Pericardium: There is no evidence of pericardial effusion. Mitral Valve: The mitral valve is normal in structure. Normal mobility of the mitral valve leaflets. No evidence of mitral valve regurgitation. No evidence of mitral valve stenosis. Tricuspid Valve: The tricuspid valve is normal in structure. Tricuspid valve regurgitation is not demonstrated. No evidence of tricuspid stenosis. Aortic Valve: The aortic valve is normal in structure. Aortic valve regurgitation is not visualized. No aortic stenosis is present. Pulmonic Valve: The pulmonic valve was normal in structure. Pulmonic valve regurgitation is not visualized. No evidence of pulmonic stenosis. Aorta: The aortic root is  normal in size and structure. Venous: The inferior vena cava is normal in size with greater than 50% respiratory variability, suggesting right atrial pressure of 3 mmHg. IAS/Shunts: No atrial level shunt detected by color flow Doppler.  LEFT VENTRICLE PLAX 2D LVIDd:         4.80 cm Diastology LVIDs:         3.90 cm LV e' lateral:   11.00 cm/s LV PW:         1.00 cm LV E/e' lateral: 7.9 LV IVS:        1.00 cm LV e' medial:    10.30 cm/s                        LV E/e' medial:  8.4  RIGHT VENTRICLE RV S prime:     16.00 cm/s TAPSE (M-mode): 2.0 cm LEFT ATRIUM             Index       RIGHT ATRIUM           Index LA Vol (A2C):   40.0 ml 16.13 ml/m RA Area:     14.50 cm LA Vol (A4C):   58.6 ml 23.63 ml/m RA Volume:   37.30 ml  15.04 ml/m LA Biplane Vol: 53.5 ml 21.57 ml/m  AORTIC VALVE LVOT Vmax:   108.00 cm/s LVOT Vmean:  68.100 cm/s LVOT VTI:    0.186 m  AORTA Ao Asc diam: 2.50 cm MITRAL VALVE               TRICUSPID VALVE MV Area (PHT): 4.21 cm    TR Peak grad:   17.6 mmHg MV Decel Time: 180 msec    TR Vmax:        210.00 cm/s MV E velocity: 87.00 cm/s MV A velocity: 51.00 cm/s  SHUNTS MV E/A ratio:  1.71        Systemic VTI: 0.19 m Jenkins Rouge MD Electronically signed by Jenkins Rouge MD Signature Date/Time: 07/22/2020/1:51:11 PM    Final     Results/Tests Pending at Time of Discharge:  Sed rate resulted: Elevated at 71 Prothrombin gene mutation Protein S, total Protein S activity, Protein C, total Protein C activity Lupus anticoagulant protein Homocystine, serum Factor V Leiden Cardiolipin  antibodies ANCA titers Beta-2 glycoprotein I ABS, IgG/M/A  Discharge Medications:  Allergies as of 07/22/2020   No Known Allergies     Medication List    STOP taking these medications   hydrochlorothiazide 25 MG tablet Commonly known as: HYDRODIURIL   lisinopril 40 MG tablet Commonly known as: ZESTRIL   medroxyPROGESTERone 150 MG/ML injection Commonly known as: DEPO-PROVERA   Synjardy 04-999 MG  Tabs Generic drug: Empagliflozin-metFORMIN HCl     TAKE these medications   Accu-Chek FastClix Lancets Misc 1 each by Does not apply route 4 (four) times daily -  before meals and at bedtime. Check sugar 6 x daily   Accu-Chek Nano SmartView w/Device Kit 1 Device by Does not apply route 4 (four) times daily -  before meals and at bedtime.   aspirin 81 MG EC tablet Take 1 tablet (81 mg total) by mouth daily. Swallow whole. What changed: additional instructions   atorvastatin 80 MG tablet Commonly known as: LIPITOR Take 1 tablet (80 mg total) by mouth daily. What changed:   medication strength  how much to take   clopidogrel 75 MG tablet Commonly known as: PLAVIX Take 1 tablet (75 mg total) by mouth daily.   glucose blood test strip Use to check blood sugar once daily or as directed.   insulin glargine 100 unit/mL Sopn Commonly known as: LANTUS Inject 0.2 mLs (20 Units total) into the skin daily.   metFORMIN 1000 MG tablet Commonly known as: Glucophage Take 1 tablet (1,000 mg total) by mouth 2 (two) times daily with a meal.       Discharge Instructions: Please refer to Patient Instructions section of EMR for full details.  Patient was counseled important signs and symptoms that should prompt return to medical care, changes in medications, dietary instructions, activity restrictions, and follow up appointments.   Follow-Up Appointments:  07/24/2020- Dr. Ezequiel Essex [facility] medicine at [facility]  Damita Dunnings, MD  PGY-1, [facility]

## 2020-07-24 ENCOUNTER — Other Ambulatory Visit: Payer: Self-pay

## 2020-07-24 ENCOUNTER — Encounter: Payer: Self-pay | Admitting: Family Medicine

## 2020-07-24 ENCOUNTER — Ambulatory Visit (INDEPENDENT_AMBULATORY_CARE_PROVIDER_SITE_OTHER): Payer: Medicaid Other | Admitting: Family Medicine

## 2020-07-24 DIAGNOSIS — E119 Type 2 diabetes mellitus without complications: Secondary | ICD-10-CM

## 2020-07-24 DIAGNOSIS — Z3042 Encounter for surveillance of injectable contraceptive: Secondary | ICD-10-CM | POA: Diagnosis not present

## 2020-07-24 DIAGNOSIS — E785 Hyperlipidemia, unspecified: Secondary | ICD-10-CM | POA: Diagnosis not present

## 2020-07-24 DIAGNOSIS — F17200 Nicotine dependence, unspecified, uncomplicated: Secondary | ICD-10-CM | POA: Diagnosis not present

## 2020-07-24 DIAGNOSIS — I63511 Cerebral infarction due to unspecified occlusion or stenosis of right middle cerebral artery: Secondary | ICD-10-CM | POA: Diagnosis not present

## 2020-07-24 LAB — LUPUS ANTICOAGULANT PANEL
DRVVT: 46.2 s (ref 0.0–47.0)
PTT Lupus Anticoagulant: 62.1 s — ABNORMAL HIGH (ref 0.0–51.9)

## 2020-07-24 LAB — PROTEIN C, TOTAL: Protein C, Total: 112 % (ref 60–150)

## 2020-07-24 LAB — FACTOR 5 LEIDEN

## 2020-07-24 LAB — PTT-LA MIX: PTT-LA Mix: 60 s — ABNORMAL HIGH (ref 0.0–48.9)

## 2020-07-24 LAB — BETA-2-GLYCOPROTEIN I ABS, IGG/M/A
Beta-2 Glyco I IgG: <9 GPI IgG units (ref 0–20)
Beta-2-Glycoprotein I IgA: <9 GPI IgA units (ref 0–25)
Beta-2-Glycoprotein I IgM: <9 GPI IgM units (ref 0–32)

## 2020-07-24 LAB — HEXAGONAL PHASE PHOSPHOLIPID: Hexagonal Phase Phospholipid: 11 s (ref 0–11)

## 2020-07-24 MED ORDER — BLOOD GLUCOSE MONITOR KIT: PACK | 0 refills | Status: DC

## 2020-07-24 MED ORDER — BD INSULIN SYRINGE U-500 31G X 6MM 0.5 ML MISC: 1.0000 [IU] | Freq: Every day | 2 refills | Status: DC

## 2020-07-24 MED ORDER — BLOOD GLUCOSE MONITOR KIT: PACK | 0 refills | Status: AC

## 2020-07-24 NOTE — Assessment & Plan Note (Addendum)
Previously on depo-provara. Last injection June, next due in September. Advised against hormonal contraception due to hypercoagulation risk in setting of recent CVA. After discussion, pt would like copper IUD. She will set up an appointment with our [place] clinic as soon as possible to have this placed.

## 2020-07-24 NOTE — Assessment & Plan Note (Addendum)
Tolerating atorvastatin 80 mg well. No muscle aches or cramps. No GI side effects. Will continue.

## 2020-07-24 NOTE — Progress Notes (Signed)
SUBJECTIVE:   CHIEF COMPLAINT / HPI:  Ms. Bewick is a pleasant 44 year old here today for hospital follow-up.  Was discharged 07/22/2020 after CVA of right middle cerebral artery.  Short hospital summary Presented to emergency room with reports of intermittent dysarthria and blood glucose 847.  Patient was put on insulin GGT and DKA protocol despite no anion gap elevation. CT and MRI head showed abnormalities indicative of subacute infarct in right MCA, along with hemodynamically significant circumferential narrowing of distal cavernous and right ICA.  Both TTE and CTA neck negative.  Neurology was consulted, ordered hypercoagulable labs.  Per neuro, patient discharged on DAPT (aspirin 81 and clopidogrel 75) for 3 weeks followed by aspirin monotherapy.  Also discharged on 8/18 with atorvastatin 80 mg and Lantus 20 units daily.  Ms. Englander states that she feels much better than when she was admitted.  She reports tolerating all of her medicines quite well.  No nausea, vomiting, or diarrhea.  No muscle cramps or muscle aches.  She reports doing just fine with injecting her Lantus, she is used this and has no problems with needles.  She wanted to discuss alternate birth control methods, as she was told in the hospital she cannot continue her Depo-Provera.  After discussion, she prefers the copper IUD and will schedule an appointment with our colposcopy clinic as soon as possible.  PERTINENT  PMH / PSH: Hypertension, type 2 diabetes, diabetic microalbuminuria, hyperlipidemia, tobacco use, morbid obesity  OBJECTIVE:   BP 124/76   Pulse 75   Wt 272 lb (123.4 kg)   SpO2 98%   BMI 36.89 kg/m     PHQ-9:  Depression screen [facility] 2/9 07/24/2020 07/09/2020 06/19/2020  Decreased Interest 0 0 0  Down, Depressed, Hopeless 0 0 0  PHQ - 2 Score 0 0 0  Altered sleeping 0 0 -  Tired, decreased energy 0 1 -  Change in appetite 0 0 -  Feeling bad or failure about yourself  0 0 -  Trouble concentrating 0 0 -   Moving slowly or fidgety/restless 0 0 -  Suicidal thoughts 0 0 -  PHQ-9 Score 0 1 -     GAD-7: No flowsheet data found.    Physical Exam General: Awake, alert, oriented Cardiovascular: Regular rate and rhythm, S1 and S2 present, no murmurs auscultated Respiratory: Lung fields clear to auscultation bilaterally Extremities: No bilateral lower extremity edema, palpable pedal and pretibial pulses bilaterally Neuro: Cranial nerves II through X grossly intact, able to move all extremities spontaneously   ASSESSMENT/PLAN:   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 ([place]) Feels much better than when she was admitted to the hospital. Reinforced discharge instruction to continue aspirin and plavix for 3 weeks then continue afterwards on aspirin monotherapy. Tolerating statin well. Hypercoagulability labs returned after discharge; largely normal with a few minor abnormalities. Discussed with Dr. Nori Riis and we believe it is safe to not pursue further lab testing.   Type 2 diabetes mellitus (HCC) A1c at discharge >13. Sent home on 20 units lantus daily. Pt not prescribed insulin needles or glucose monitor; ordered today. Will return in 3 months for A1c check.  Hyperlipemia Tolerating atorvastatin 80 mg well. No muscle aches or cramps. No GI side effects. Will continue.   Tobacco use disorder Pt is trying to quit smoking. She has reduced her smoking from 3-4 cigarettes daily to "a couple puffs only". She is hopeful to be completely stopped soon. Reinforced our support for  her and advised to contact us if she would like smoking cessation resources.   Contraceptive management Previously on depo-provara. Last injection June, next due in September. Advised against hormonal contraception due to hypercoagulation risk in setting of recent CVA. After discussion, pt would like copper IUD. She will set up an appointment with our [place] clinic as soon as possible to have  this placed.      Ezequiel Essex, MD [place]

## 2020-07-24 NOTE — Assessment & Plan Note (Addendum)
Feels much better than when she was admitted to the hospital. Reinforced discharge instruction to continue aspirin and plavix for 3 weeks then continue afterwards on aspirin monotherapy. Tolerating statin well. Hypercoagulability labs returned after discharge; largely normal with a few minor abnormalities. Discussed with Dr. Nori Riis and we believe it is safe to not pursue further lab testing.

## 2020-07-24 NOTE — Assessment & Plan Note (Signed)
Pt is trying to quit smoking. She has reduced her smoking from 3-4 cigarettes daily to "a couple puffs only". She is hopeful to be completely stopped soon. Reinforced our support for her and advised to contact us if she would like smoking cessation resources.

## 2020-07-24 NOTE — Assessment & Plan Note (Signed)
A1c at discharge >13. Sent home on 20 units lantus daily. Pt not prescribed insulin needles or glucose monitor; ordered today. Will return in 3 months for A1c check.

## 2020-07-24 NOTE — Patient Instructions (Addendum)
It was wonderful to meet you today. Thank you for allowing me to be part of your care. Below is a short summary of what we talked about today at your visit:  Diabetes Continue to take your Lantus 20 units daily and Metformin 1000 mg twice a day. I have given you a printout prescription for a glucose meter kit. Just take this to your pharmacy to have it filled. I have also electronically sent a prescription to your pharmacy for insulin syringes and needles. Please give [place] a call or message if you have any difficulties at the pharmacy. Follow-up with [place] in 3 months to check your A1c.  Birth control Given your history of recent stroke and other risk factors like smoking, we highly recommend using the copper IUD as her new birth control method. Please stop by the front office to make an appointment for the colposcopy clinic as soon as possible.  Blood thinner medications You will take both your aspirin 81 mg and Plavix 75 mg every day for the next 3 weeks. At the 3-week mark, on Tuesday, Sep 7, stop taking Plavix and continue to take only aspirin 81 mg every day. The labs we performed in the hospital to test for possible conditions for blood clots came back largely normal. While there were some abnormal values, we do not need to act on any of them at this time. Smoking cessation is one of the most important things he can do right now to protect your health.  We will see you in about a month at the colposcopy clinic for your IUD placement and again in 3 months to check your A1c. If you have any questions or concerns, please not hesitate to contact us via phone call or MyChart message.  Ezequiel Essex, MD

## 2020-07-25 LAB — HOMOCYSTEINE: Homocysteine: 13.3 umol/L (ref 0.0–14.5)

## 2020-07-28 LAB — PROTHROMBIN GENE MUTATION

## 2020-07-30 ENCOUNTER — Telehealth: Payer: Self-pay | Admitting: Family Medicine

## 2020-07-30 NOTE — Telephone Encounter (Signed)
Called to check up on patient to see how she is doing after her hospital discharge following her stroke. Left VM.

## 2020-08-03 ENCOUNTER — Telehealth: Payer: Self-pay | Admitting: *Deleted

## 2020-08-03 NOTE — Telephone Encounter (Signed)
-----   Message from Lattie Haw, MD sent at 07/30/2020  9:43 AM EDT ----- Regarding: neuro follow up Hi team! Please could you check whether this patient has a neurology follow up? She was recently d/c from hospital following a stroke. I do not see a follow up on the system. If she does not have one ill place one.  Thank you   [place]

## 2020-08-03 NOTE — Telephone Encounter (Signed)
To call office

## 2020-08-11 ENCOUNTER — Telehealth: Payer: Self-pay

## 2020-08-11 NOTE — Telephone Encounter (Signed)
Patient calls nurse line over and over to request a referral to an optometrist. Patient reports her current eye doctor can not see her until the end of the year. Patient would like to go to [facility] and see Dr. Alois Cliche. Please advise. I do not see an update to referral for eyes in chart.

## 2020-08-12 ENCOUNTER — Other Ambulatory Visit: Payer: Self-pay | Admitting: Family Medicine

## 2020-08-12 ENCOUNTER — Ambulatory Visit: Payer: Medicaid Other | Admitting: Family Medicine

## 2020-08-12 DIAGNOSIS — Z0100 Encounter for examination of eyes and vision without abnormal findings: Secondary | ICD-10-CM

## 2020-08-12 NOTE — Telephone Encounter (Signed)
I have placed this referral for the patient. Thank you.

## 2020-08-16 DIAGNOSIS — Z30430 Encounter for insertion of intrauterine contraceptive device: Secondary | ICD-10-CM | POA: Insufficient documentation

## 2020-08-16 HISTORY — DX: Encounter for insertion of intrauterine contraceptive device: Z30.430

## 2020-08-16 NOTE — Patient Instructions (Signed)
Intrauterine Device Insertion, Care After  This sheet gives you information about how to care for yourself after your procedure. Your health care provider may also give you more specific instructions. If you have problems or questions, contact your health care provider. What can I expect after the procedure? After the procedure, it is common to have:  Cramps and pain in the abdomen.  Light bleeding (spotting) or heavier bleeding that is like your menstrual period. This may last for up to a few days.  Lower back pain.  Dizziness.  Headaches.  Nausea. Follow these instructions at home:  Before resuming sexual activity, check to make sure that you can feel the IUD string(s). You should be able to feel the end of the string(s) below the opening of your cervix. If your IUD string is in place, you may resume sexual activity. ? If you had a hormonal IUD inserted more than 7 days after your most recent period started, you will need to use a backup method of birth control for 7 days after IUD insertion. Ask your health care provider whether this applies to you.  Continue to check that the IUD is still in place by feeling for the string(s) after every menstrual period, or once a month.  Take over-the-counter and prescription medicines only as told by your health care provider.  Do not drive or use heavy machinery while taking prescription pain medicine.  Keep all follow-up visits as told by your health care provider. This is important. Contact a health care provider if:  You have bleeding that is heavier or lasts longer than a normal menstrual cycle.  You have a fever.  You have cramps or abdominal pain that get worse or do not get better with medicine.  You develop abdominal pain that is new or is not in the same area of earlier cramping and pain.  You feel lightheaded or weak.  You have abnormal or bad-smelling discharge from your vagina.  You have pain during sexual  activity.  You have any of the following problems with your IUD string(s): ? The string bothers or hurts you or your sexual partner. ? You cannot feel the string. ? The string has gotten longer.  You can feel the IUD in your vagina.  You think you may be pregnant, or you miss your menstrual period.  You think you may have an STI (sexually transmitted infection). Get help right away if:  You have flu-like symptoms.  You have a fever and chills.  You can feel that your IUD has slipped out of place. Summary  After the procedure, it is common to have cramps and pain in the abdomen. It is also common to have light bleeding (spotting) or heavier bleeding that is like your menstrual period.  Continue to check that the IUD is still in place by feeling for the string(s) after every menstrual period, or once a month.  Keep all follow-up visits as told by your health care provider. This is important.  Contact your health care provider if you have problems with your IUD string(s), such as the string getting longer or bothering you or your sexual partner. This information is not intended to replace advice given to you by your health care provider. Make sure you discuss any questions you have with your health care provider. Document Revised: 11/03/2017 Document Reviewed: 10/12/2016 Elsevier Patient Education  2020 Elsevier Inc.  

## 2020-08-16 NOTE — Progress Notes (Signed)
    SUBJECTIVE:   CHIEF COMPLAINT / HPI:   Desire for long acting reversible contraception: pleasant 44 year old patient presents to clinic for placement of copper IUD.  Patient recently had a stroke in August 2021, and can therefore no longer have hormonal birth control.  Risks, benefits, alternatives and procedure of Copper IUD has been fully reviewed with the patient and written informed consent has been obtained.   PERTINENT  PMH / PSH:  Patient Active Problem List   Diagnosis Date Noted  . Encounter for insertion of copper intrauterine contraceptive device (IUD) 08/16/2020  . Hyperglycemia   . 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 ([place])   . Blister of breast with infection, left, initial encounter 07/09/2020  . Morbid obesity ([place]) 05/11/2020  . Chronic back pain 10/31/2018  . Right sciatic nerve pain 10/31/2018  . Routine cervical smear 04/23/2018  . Epidermoid cyst 08/04/2016  . Preventative health care 02/20/2016  . Contraceptive management 05/25/2015  . Type 2 diabetes mellitus ([place]) 04/24/2015  . Microalbuminuria due to type 2 diabetes mellitus ([place]) 03/25/2015  . Hyperlipemia 03/09/2015  . Tobacco use disorder 03/09/2015  . Essential hypertension 11/15/2012     OBJECTIVE:   BP 130/78   Pulse 83   Ht 6' (1.829 m)   Wt 269 lb 4 oz (122.1 kg)   LMP 08/17/2020   SpO2 98%   BMI 36.52 kg/m    Physical exam: General: Pleasant patient, nontoxic-appearing Respiratory: CTA bilaterally, comfortable work of breathing Cardio: RRR, S1-S2 present, no murmurs appreciated GU: Normal-appearing labia minora and majora without lesion, normal-appearing vaginal rugae, cervix normal in appearance without lesion; some bleeding appreciated after tenaculum placed and then removed for procedure.  Procedure note: IUD INSERTION: Patient given informed consent, signed copy in the chart.  Negative pregnancy confirmed.  Appropriate time out taken.    Sterile instruments and technique was used. Cervix brought into view with use of speculum and then cleansed three times with  betadine swabs.  A tenaculum was placed into the anterior lip of the cervix and a uterine sound was used to measure uterine size.   A Paragard IUD was placed into the endometrial cavity, deployed and secured. The applicator was removed. The strings were trimmed to 2 centimeters.   There were no complications and the patient tolerated the procedure well.   She was given handouts for post procedure instructions and information about the IUD including a card with the time of recommended removal. She was reminded that the IUD does not protect against sexually transmitted diseases.   ASSESSMENT/PLAN:   Encounter for insertion of copper intrauterine contraceptive device (IUD) IUD placed at today's visit 08/17/2020. -Patient given instructions to have copper IUD replaced in September 2031     Darlene Bennett, [place]

## 2020-08-17 ENCOUNTER — Encounter: Payer: Self-pay | Admitting: Family Medicine

## 2020-08-17 ENCOUNTER — Other Ambulatory Visit: Payer: Self-pay

## 2020-08-17 ENCOUNTER — Ambulatory Visit (INDEPENDENT_AMBULATORY_CARE_PROVIDER_SITE_OTHER): Payer: Medicaid Other | Admitting: Family Medicine

## 2020-08-17 VITALS — BP 130/78 | HR 83 | Ht 72.0 in | Wt 269.2 lb

## 2020-08-17 DIAGNOSIS — Z30430 Encounter for insertion of intrauterine contraceptive device: Secondary | ICD-10-CM | POA: Diagnosis not present

## 2020-08-17 MED ORDER — PARAGARD INTRAUTERINE COPPER IU IUD
1.0000 | INTRAUTERINE_SYSTEM | Freq: Once | INTRAUTERINE | Status: AC
  Administered 2020-08-17: 1 via INTRAUTERINE

## 2020-08-17 NOTE — Assessment & Plan Note (Signed)
IUD placed at today's visit 08/17/2020. -Patient given instructions to have copper IUD replaced in September 2031

## 2020-08-19 ENCOUNTER — Other Ambulatory Visit: Payer: Self-pay | Admitting: Family Medicine

## 2020-08-31 ENCOUNTER — Encounter: Payer: Self-pay | Admitting: Family Medicine

## 2020-08-31 LAB — HM DIABETES EYE EXAM

## 2020-09-04 ENCOUNTER — Ambulatory Visit: Payer: Medicaid Other | Admitting: Family Medicine

## 2020-09-15 ENCOUNTER — Other Ambulatory Visit: Payer: Self-pay

## 2020-09-15 ENCOUNTER — Encounter: Payer: Self-pay | Admitting: Family Medicine

## 2020-09-15 ENCOUNTER — Ambulatory Visit (INDEPENDENT_AMBULATORY_CARE_PROVIDER_SITE_OTHER): Payer: Medicaid Other | Admitting: Family Medicine

## 2020-09-15 VITALS — BP 142/94 | HR 84 | Ht 72.0 in | Wt 271.8 lb

## 2020-09-15 DIAGNOSIS — I639 Cerebral infarction, unspecified: Secondary | ICD-10-CM | POA: Diagnosis not present

## 2020-09-15 DIAGNOSIS — I10 Essential (primary) hypertension: Secondary | ICD-10-CM

## 2020-09-15 DIAGNOSIS — M25561 Pain in right knee: Secondary | ICD-10-CM | POA: Diagnosis not present

## 2020-09-15 DIAGNOSIS — Z111 Encounter for screening for respiratory tuberculosis: Secondary | ICD-10-CM

## 2020-09-15 DIAGNOSIS — E785 Hyperlipidemia, unspecified: Secondary | ICD-10-CM

## 2020-09-15 DIAGNOSIS — I63511 Cerebral infarction due to unspecified occlusion or stenosis of right middle cerebral artery: Secondary | ICD-10-CM | POA: Diagnosis not present

## 2020-09-15 MED ORDER — EZETIMIBE 10 MG PO TABS: 10.0000 mg | ORAL_TABLET | Freq: Every day | ORAL | 3 refills | Status: DC

## 2020-09-15 MED ORDER — DICLOFENAC SODIUM 1 % EX GEL: 2.0000 g | Freq: Two times a day (BID) | CUTANEOUS | 0 refills | Status: AC

## 2020-09-15 MED ORDER — LOSARTAN POTASSIUM 25 MG PO TABS: 25.0000 mg | ORAL_TABLET | Freq: Every day | ORAL | 0 refills | Status: DC

## 2020-09-15 NOTE — Patient Instructions (Addendum)
Great to see you today!! I am starting you on zetia for your high cholesterol. Please continue the statin. I am referring you to neurology for stroke follow up.  I have booked a right knee xray for you as you might have arthritis. You can get this later this week at [facility].  Please come in later this week for further labs following your stroke.  Follow up with me in 2 weeks.  Best wishes  Dr Posey Pronto

## 2020-09-16 DIAGNOSIS — M25561 Pain in right knee: Secondary | ICD-10-CM | POA: Insufficient documentation

## 2020-09-16 HISTORY — DX: Pain in right knee: M25.561

## 2020-09-16 NOTE — Assessment & Plan Note (Signed)
LDL 192 in August despite high intensity statin. Continue Atorvastatin 80mg  and start Zetia 10mg  daily. Repeat cholesterol panel in 3 months.

## 2020-09-16 NOTE — Assessment & Plan Note (Signed)
Likely OA. Septic arthritis unlikely as no fevers and joint is not erythema or warm to touch on exam. Considered meniscal/ligament tear however no recent trauma. Will obtain knee xray. Counseled pt on weight loss for management of OA. Also prescribed topical diclofenac gel.

## 2020-09-16 NOTE — Assessment & Plan Note (Addendum)
Doing well, no residual deficit following stroke. Continue ASA 81mg  monotherapy. Reviewed hypercoagulablity labs with Dr McDiarmid. Recommended repeating protein S. Follow up with neurology and optimize BP and HLD control.

## 2020-09-16 NOTE — Assessment & Plan Note (Addendum)
BP elevated 142/94. Goal should be <130/80 given hx of stroke with diabetes. Started Losartan 25mg  today. Follow up with neurology for further management.

## 2020-09-16 NOTE — Progress Notes (Addendum)
° ° °  SUBJECTIVE:   CHIEF COMPLAINT / HPI:   Darlene Bennett is a 44 yr old female who presents today for follow up  HLD Takes Atorvastatin 80mg  daily. Tolerating well without side effects. Denies myalgias or RUQ pain.  Stroke Completed 21 days of clopidogrel post stroke and now is taking ASA 81mg  daily. Reports no residual symptoms from stroke and feels good. Has not seen neurology for stroke f/u.  HTN No longer taking Lisinopril 40mg  and HCTZ 25mg  since hospital discharge. Denies chest pain, palpitations, dizziness, dyspnea or peripheral edema.   Right knee pain Endorses right sided knee pain over the last 2-3 weeks. 7/10 severity. She think it is related to arthritis. No history of trauma, falls etc. Denies fevers, rashes, overlying skin changes.   PERTINENT  PMH / PSH: stroke, HLD   OBJECTIVE:   BP (!) 142/94    Pulse 84    Ht 6' (1.829 m)    Wt 271 lb 12.8 oz (123.3 kg)    LMP 08/17/2020    SpO2 99%    BMI 36.86 kg/m    General: Alert, no acute distress, pleasant  Cardio: Normal S1 and S2, RRR Pulm: CTAB, normal WOB  Abdomen: Bowel sounds normal. Abdomen soft and non-tender.  Extremities: No peripheral edema. Warm/ well perfused. MSK: normal gait. Mild generalized swelling of right knee. No overlying erythema. Tenderness on palpation on medial and lateral joint space. Not warm on palpation. No crepitus. Full active and passive ROM. Tenderness of locking knee.  Negative anterior, posterior drawer test, negative mcmurray's. Neuro: Cranial nerves grossly intact  ASSESSMENT/PLAN:   Essential hypertension BP elevated 142/94. Goal should be <130/80 given hx of stroke with diabetes. Started Losartan 25mg  today. Follow up with neurology for further management.  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 ([place]) Doing well, no residual deficit following stroke. Continue ASA 81mg  monotherapy. Reviewed hypercoagulablity labs with Dr McDiarmid.  Recommended repeating protein S. Follow up with neurology and optimize BP and HLD control.   Hyperlipemia LDL 192 in August despite high intensity statin. Continue Atorvastatin 80mg  and start Zetia 10mg  daily. Repeat cholesterol panel in 3 months.  Knee pain, right Likely OA. Septic arthritis unlikely as no fevers and joint is not erythema or warm to touch on exam. Considered meniscal/ligament tear however no recent trauma. Will obtain knee xray. Counseled pt on weight loss for management of OA. Also prescribed topical diclofenac gel.      Lattie Haw, MD PGY-2 [place]

## 2020-09-17 ENCOUNTER — Encounter: Payer: Self-pay | Admitting: Neurology

## 2020-09-17 ENCOUNTER — Other Ambulatory Visit: Payer: Medicaid Other

## 2020-09-17 ENCOUNTER — Other Ambulatory Visit: Payer: Self-pay

## 2020-09-17 ENCOUNTER — Ambulatory Visit: Payer: Medicaid Other | Admitting: Neurology

## 2020-09-17 ENCOUNTER — Ambulatory Visit
Admission: RE | Admit: 2020-09-17 | Discharge: 2020-09-17 | Disposition: A | Discharge status: Home / Self Care | Payer: Medicaid Other | Source: Ambulatory Visit | Attending: Family Medicine | Admitting: Family Medicine

## 2020-09-17 ENCOUNTER — Telehealth: Payer: Self-pay | Admitting: *Deleted

## 2020-09-17 ENCOUNTER — Other Ambulatory Visit: Payer: Self-pay | Admitting: Family Medicine

## 2020-09-17 VITALS — BP 152/86 | HR 87 | Ht 72.0 in | Wt 272.0 lb

## 2020-09-17 DIAGNOSIS — M25561 Pain in right knee: Secondary | ICD-10-CM

## 2020-09-17 DIAGNOSIS — I639 Cerebral infarction, unspecified: Secondary | ICD-10-CM

## 2020-09-17 DIAGNOSIS — I63511 Cerebral infarction due to unspecified occlusion or stenosis of right middle cerebral artery: Secondary | ICD-10-CM | POA: Diagnosis not present

## 2020-09-17 NOTE — Telephone Encounter (Signed)
Patient enrolled for [employer] to ship a 30 day cardiac event monitor to her home. Instructions included in monitor kit.

## 2020-09-17 NOTE — Progress Notes (Signed)
Reason for visit: Stroke  Referring physician: [facility] hospital  Darlene Bennett is a 44 y.o. female  History of present illness:  Darlene Bennett is a 44 year old left-handed black female with a history of obesity, diabetes, hypertension, tobacco abuse, and dyslipidemia.  The patient was admitted to the hospital on the 16th of August 2021.  The patient noted onset of slurred speech and a slight headache.  She denies any numbness or weakness on the arms or legs or any visual field changes.  She was noted to have a right frontal stroke by MRI, CT angiogram showed evidence of right carotid disease in the distal cavernous carotid with occlusion of some of the M3 segments of the middle cerebral artery.  The patient was felt to have possibly had an embolic stroke event.  She underwent a hypercoagulable state work-up that was relatively unremarkable exception of a slightly low protein S level.  The patient was noted to have a hemoglobin A1c of 13.5 and an LDL level of 192.  The patient was on cholesterol-lowering agents prior to coming in the hospital.  The patient was placed on aspirin and Plavix combination coming out of hospital, she is now on aspirin only.  She denies any palpitations the heart or chest pain.  She has made a good recovery following the stroke.  She is back to work as a Geneticist, molecular.  She has quit smoking, she was smoking 1.5 packs/day.  She does not drink alcohol and she denies use of illicit drugs.  Urine drug screen in the hospital was negative.  She did not require any physical therapy or occupational therapy coming out of the hospital.  Past Medical History:  Diagnosis Date   Gestational diabetes    insulin   Gestational diabetes mellitus, antepartum 02/14/2013   History of gestational diabetes    Hypertension    preeclampsia with previous pregnancy   Scalp cyst    Slurred speech    Stroke ([place])    8/21, speech difficulties    Past Surgical History:  Procedure  Laterality Date   CYST EXCISION N/A 09/15/2016   Procedure: EXCISION POSTERIOR SCALP CYST;  Surgeon: Clovis Riley, MD;  Location: [facility];  Service: General;  Laterality: N/A;   TUBAL LIGATION     2007    Family History  Problem Relation Age of Onset   Heart disease Mother    Hypertension Mother    Diabetes Mother    Hypertension Father    Diabetes Father     Social history:  reports that she quit smoking about 2 months ago. Her smoking use included cigarettes. She has a 5.50 pack-year smoking history. She has never used smokeless tobacco. She reports that she does not drink alcohol and does not use drugs.  Medications:  Prior to Admission medications   Medication Sig Start Date End Date Taking? Authorizing Provider  ACCU-CHEK FASTCLIX LANCETS MISC 1 each by Does not apply route 4 (four) times daily -  before meals and at bedtime. Check sugar 6 x daily 05/11/17  Yes Katheren Shams, DO  aspirin EC 81 MG EC tablet Take 1 tablet (81 mg total) by mouth daily. Swallow whole. 07/23/20  Yes Milus Banister C, DO  atorvastatin (LIPITOR) 80 MG tablet Take 1 tablet (80 mg total) by mouth daily. 07/22/20  Yes Milus Banister C, DO  blood glucose meter kit and supplies KIT Dispense based on patient and insurance preference. Use up to four times daily  as directed. (FOR ICD-9 250.00, 250.01). 07/24/20  Yes Ezequiel Essex, MD  Blood Glucose Monitoring Suppl (ACCU-CHEK NANO SMARTVIEW) W/DEVICE KIT 1 Device by Does not apply route 4 (four) times daily -  before meals and at bedtime. 03/09/15  Yes Luiz Blare Y, DO  clopidogrel (PLAVIX) 75 MG tablet Take 1 tablet (75 mg total) by mouth daily. 07/23/20  Yes Milus Banister C, DO  diclofenac Sodium (VOLTAREN) 1 % GEL Apply 2 g topically in the morning and at bedtime. 09/15/20 10/23/20 Yes Lattie Haw, MD  ezetimibe (ZETIA) 10 MG tablet Take 1 tablet (10 mg total) by mouth daily. 09/15/20  Yes Lattie Haw, MD  glucose blood test strip Use to  check blood sugar once daily or as directed. 05/14/20  Yes Lattie Haw, MD  insulin glargine (LANTUS) 100 unit/mL SOPN Inject 0.2 mLs (20 Units total) into the skin daily. 07/22/20  Yes Milus Banister C, DO  Insulin Syringe/Needle U-500 (BD INSULIN SYRINGE U-500) 31G X 6MM 0.5 ML MISC 1 Units by Does not apply route daily. 07/24/20  Yes Ezequiel Essex, MD  losartan (COZAAR) 25 MG tablet Take 1 tablet (25 mg total) by mouth at bedtime. 09/15/20 10/15/20 Yes Lattie Haw, MD  metFORMIN (GLUCOPHAGE) 1000 MG tablet Take 1 tablet (1,000 mg total) by mouth 2 (two) times daily with a meal. 07/22/20 07/22/21 Yes Milus Banister C, DO     No Known Allergies  ROS:  Out of a complete 14 system review of symptoms, the patient complains only of the following symptoms, and all other reviewed systems are negative.  Occasional headache  Blood pressure (!) 152/86, pulse 87, height 6' (1.829 m), weight 272 lb (123.4 kg).  Physical Exam  General: The patient is alert and cooperative at the time of the examination.  The patient is moderately to markedly obese.  Eyes: Pupils are equal, round, and reactive to light. Discs are flat bilaterally.  Neck: The neck is supple, no carotid bruits are noted.  Respiratory: The respiratory examination is clear.  Cardiovascular: The cardiovascular examination reveals a regular rate and rhythm, no obvious murmurs or rubs are noted.  Skin: Extremities are without significant edema.  Neurologic Exam  Mental status: The patient is alert and oriented x 3 at the time of the examination. The patient has apparent normal recent and remote memory, with an apparently normal attention span and concentration ability.  Cranial nerves: Facial symmetry is present. There is good sensation of the face to pinprick and soft touch bilaterally. The strength of the facial muscles and the muscles to head turning and shoulder shrug are normal bilaterally. Speech is well enunciated, no  aphasia or dysarthria is noted. Extraocular movements are full. Visual fields are full. The tongue is midline, and the patient has symmetric elevation of the soft palate. No obvious hearing deficits are noted.  Motor: The motor testing reveals 5 over 5 strength of all 4 extremities. Good symmetric motor tone is noted throughout.  Sensory: Sensory testing is intact to pinprick, soft touch, vibration sensation, and position sense on all 4 extremities. No evidence of extinction is noted.  Coordination: Cerebellar testing reveals good finger-nose-finger and heel-to-shin bilaterally.  Gait and station: Gait is normal. Tandem gait is normal. Romberg is negative. No drift is seen.  Reflexes: Deep tendon reflexes are symmetric, but are depressed bilaterally. Toes are downgoing bilaterally.   MRI brain 07/21/20:  IMPRESSION: Patchy subacute infarct in the right MCA distribution with petechial Hemorrhage.  * MRI scan images were  reviewed online. I agree with the written report.   2D echo 07/22/20:  IMPRESSIONS    1. Left ventricular ejection fraction, by estimation, is 60 to 65%. The  left ventricle has normal function. The left ventricle has no regional  wall motion abnormalities. Left ventricular diastolic parameters were  normal.  2. Right ventricular systolic function is normal. The right ventricular  size is normal. There is normal pulmonary artery systolic pressure.  3. The mitral valve is normal in structure. No evidence of mitral valve  regurgitation. No evidence of mitral stenosis.  4. The aortic valve is normal in structure. Aortic valve regurgitation is  not visualized. No aortic stenosis is present.  5. The inferior vena cava is normal in size with greater than 50%  respiratory variability, suggesting right atrial pressure of 3 mmHg.    CTA head and neck 07/21/20:  IMPRESSION: 1. Positive for hemodynamically significant circumferential narrowing/highly tapered  appearance of the distal cavernous Right ICA. No calcified plaque is identified. The top differential considerations are intracranial atherosclerosis versus vasculitis (see also #3).  2. Although negative for ICA or other large vessel occlusion, I do suspect one or more Right MCA M3 or distal branch occlusions.  3. No other hemodynamically significant intracranial stenosis, but there is mild irregularity also of the cavernous left ICA, the distal Basilar Artery, bilateral PCAs.  4. Negative Neck CTA findings.  No extracranial atherosclerosis.  5. Expected CT appearance of patchy right hemisphere infarcts with petechial hemorrhage seen by MRI today. No malignant hemorrhagic transformation or intracranial mass effect. No new intracranial abnormality.  6. Carious right mandible molar.    Assessment/Plan:  1.  Cerebrovascular disease, right frontal stroke  He was felt that the stroke could have been embolic in nature, for this reason I will set the patient up for a 30-day cardiac monitor study.  She will remain on aspirin.  The LDL remains elevated even on a statin drug, this should be followed over time.  Fortunately, the patient has quit smoking.  Blood pressures are somewhat elevated today, and should be under 409 systolic.  The patient will need close follow-up with her primary care physician in this regard.  She will come back here if needed.  Jill Alexanders MD 09/17/2020 10:29 AM  [facility] [address] [place], [place] [address]  Phone [phone] Fax [phone]

## 2020-09-18 ENCOUNTER — Ambulatory Visit: Payer: Medicaid Other

## 2020-09-18 ENCOUNTER — Encounter: Payer: Self-pay | Admitting: Family Medicine

## 2020-09-18 ENCOUNTER — Other Ambulatory Visit: Payer: Self-pay

## 2020-09-18 DIAGNOSIS — I639 Cerebral infarction, unspecified: Secondary | ICD-10-CM | POA: Diagnosis not present

## 2020-09-18 DIAGNOSIS — Z111 Encounter for screening for respiratory tuberculosis: Secondary | ICD-10-CM

## 2020-09-18 LAB — TB SKIN TEST
Induration: 0 mm
TB Skin Test: NEGATIVE

## 2020-09-18 NOTE — Progress Notes (Signed)
PPD Reading Note PPD read and results entered in EpicCare. Result: 0 mm induration. Interpretation: Negative Allergic reaction: No  

## 2020-09-18 NOTE — Progress Notes (Signed)
PPD place on left forearm. Pt tolerated well, pt will return on 10/15 to have PPD read. Ottis Stain, CMA

## 2020-09-18 NOTE — Addendum Note (Signed)
Addended by: Dorna Bloom on: 09/18/2020 09:21 AM   Modules accepted: Orders

## 2020-09-19 LAB — PROTEIN S, TOTAL: Protein S Ag, Total: 87 % (ref 60–150)

## 2020-09-22 ENCOUNTER — Telehealth: Payer: Self-pay

## 2020-09-22 NOTE — Telephone Encounter (Signed)
Left VM explaining xray results.

## 2020-09-22 NOTE — Telephone Encounter (Signed)
Patient calls nurse line requesting returned call from provider regarding recent knee x-ray results.   To PCP  Talbot Grumbling, RN

## 2020-09-27 ENCOUNTER — Other Ambulatory Visit: Payer: Self-pay | Admitting: Family Medicine

## 2020-09-30 ENCOUNTER — Encounter (INDEPENDENT_AMBULATORY_CARE_PROVIDER_SITE_OTHER): Payer: Medicaid Other

## 2020-09-30 DIAGNOSIS — I4891 Unspecified atrial fibrillation: Secondary | ICD-10-CM | POA: Diagnosis not present

## 2020-09-30 DIAGNOSIS — I63511 Cerebral infarction due to unspecified occlusion or stenosis of right middle cerebral artery: Secondary | ICD-10-CM | POA: Diagnosis not present

## 2020-10-20 ENCOUNTER — Other Ambulatory Visit: Payer: Self-pay | Admitting: Family Medicine

## 2020-11-02 ENCOUNTER — Telehealth: Payer: Self-pay

## 2020-11-02 DIAGNOSIS — E119 Type 2 diabetes mellitus without complications: Secondary | ICD-10-CM

## 2020-11-02 MED ORDER — BD INSULIN SYRINGE U-500 31G X 6MM 0.5 ML MISC: 1.0000 [IU] | Freq: Every day | 2 refills | Status: AC

## 2020-11-02 NOTE — Telephone Encounter (Signed)
Patient calls nurse line for refill on pen needles. Patient has diabetes check up on 12/14.  Sent per protocol.   Talbot Grumbling, RN

## 2020-11-03 ENCOUNTER — Other Ambulatory Visit: Payer: Self-pay | Admitting: Neurology

## 2020-11-03 DIAGNOSIS — I63511 Cerebral infarction due to unspecified occlusion or stenosis of right middle cerebral artery: Secondary | ICD-10-CM

## 2020-11-03 DIAGNOSIS — I4891 Unspecified atrial fibrillation: Secondary | ICD-10-CM

## 2020-11-04 ENCOUNTER — Other Ambulatory Visit: Payer: Self-pay | Admitting: Family Medicine

## 2020-11-04 NOTE — Telephone Encounter (Signed)
Called pharmacy, refilled patient's pen needles. Please inform patient.

## 2020-11-16 ENCOUNTER — Other Ambulatory Visit: Payer: Self-pay | Admitting: Family Medicine

## 2020-11-17 ENCOUNTER — Ambulatory Visit: Payer: Medicaid Other | Admitting: Family Medicine

## 2020-11-17 NOTE — Progress Notes (Deleted)
     SUBJECTIVE:   CHIEF COMPLAINT / HPI:   Darlene Bennett is a 44 y.o. female presents for diabetes follow up   Diabetes Patient's current diabetic medications include***. Tolerating well without side effects.  Patient endorses compliance with these medications. CBG readings averaging in the *** range.  Patient's last A1c was  Lab Results  Component Value Date   HGBA1C 13.5 (H) 07/21/2020   HGBA1C 6.8 05/11/2020   HGBA1C 6.6 10/10/2019   on **.  Current A1c today is***.  Denies abdominal pain, blurred vision, polyuria, polydipsia, hypoglycemia ***. Patient states they understand that diet and exercise can help with her diabetes.***.    Last Microalbumin, LDL, Creatinine: Lab Results  Component Value Date   MICROALBUR 150 10/24/2019   LDLCALC 192 (H) 07/21/2020   CREATININE 1.08 (H) 07/22/2020    Hypertension: Patient's current antihypertensive  medications include: *** Compliant with medications and tolerating well without side effects.  Checking BP at home with readings between *** and **.  Denies any SOB, CP, vision changes, LE edema, medication SEs, or symptoms of hypotension.    PERTINENT  PMH / PSH: ***  OBJECTIVE:   There were no vitals taken for this visit.   General: Alert, no acute distress Cardio: Normal S1 and S2, RRR, no r/m/g Pulm: CTAB, normal work of breathing Abdomen: Bowel sounds normal. Abdomen soft and non-tender.  Extremities: No peripheral edema.  Neuro: Cranial nerves grossly intact   ASSESSMENT/PLAN:   No problem-specific Assessment & Plan notes found for this encounter.     Lattie Haw, MD [place]

## 2020-11-23 ENCOUNTER — Other Ambulatory Visit: Payer: Self-pay | Admitting: Family Medicine

## 2020-12-02 ENCOUNTER — Telehealth: Payer: Self-pay | Admitting: Neurology

## 2020-12-02 NOTE — Telephone Encounter (Signed)
I called the patient.  The heart monitor study was unremarkable, this does not change therapy, she will continue on with her current therapy plans.   Cardiac monitor study 12/01/20:  Narrative & Impression Sinus rhythm No afib Baseline artifact limits interpretation at times

## 2020-12-09 ENCOUNTER — Other Ambulatory Visit: Payer: Self-pay

## 2020-12-09 MED ORDER — METFORMIN HCL 1000 MG PO TABS: 1000.0000 mg | ORAL_TABLET | Freq: Two times a day (BID) | ORAL | 1 refills | Status: DC

## 2021-01-22 ENCOUNTER — Other Ambulatory Visit: Payer: Self-pay | Admitting: Family Medicine

## 2021-05-07 ENCOUNTER — Ambulatory Visit (INDEPENDENT_AMBULATORY_CARE_PROVIDER_SITE_OTHER): Payer: Medicaid Other | Admitting: Family Medicine

## 2021-05-07 ENCOUNTER — Encounter: Payer: Self-pay | Admitting: Family Medicine

## 2021-05-07 ENCOUNTER — Other Ambulatory Visit: Payer: Self-pay

## 2021-05-07 VITALS — BP 150/102 | HR 90 | Ht 72.0 in | Wt 259.2 lb

## 2021-05-07 DIAGNOSIS — N939 Abnormal uterine and vaginal bleeding, unspecified: Secondary | ICD-10-CM

## 2021-05-07 DIAGNOSIS — E119 Type 2 diabetes mellitus without complications: Secondary | ICD-10-CM | POA: Diagnosis present

## 2021-05-07 DIAGNOSIS — Z30431 Encounter for routine checking of intrauterine contraceptive device: Secondary | ICD-10-CM | POA: Diagnosis not present

## 2021-05-07 LAB — POCT GLYCOSYLATED HEMOGLOBIN (HGB A1C): HbA1c, POC (controlled diabetic range): 6.3 % (ref 0.0–7.0)

## 2021-05-07 LAB — POCT HEMOGLOBIN: Hemoglobin: 11.4 g/dL (ref 11–14.6)

## 2021-05-07 NOTE — Patient Instructions (Addendum)
It was wonderful to meet you today. Thank you for allowing me to be a part of your care. Below is a short summary of what we discussed at your visit today:  Periods - because of your stroke we are very limited in medicines to use to limit periods - you may elect to have a progesterone-only IUD placed in the future - I would suggest checking in with your insurance, as IUDs are expensive and your insurance may not cover a new IUD only a year or so after placement of the first one - most women experience menopause between the ages of 91 and 57, so your time having periods may be naturally limited  Diabetes Your A1c today was 6.3. Keep up the good work!   If you have any questions or concerns, please do not hesitate to contact us via phone or MyChart message.   Ezequiel Essex, MD

## 2021-05-10 NOTE — Assessment & Plan Note (Signed)
Due for A1c check today. A1c today 6.3, down from 13.5 on 07/21/20. Prescribed metformin 1,000 mg BID and lantus 20 units qHS. No changes to therapy at this time. Recommend follow up with PCP for diabetes management and medication adjustment.

## 2021-05-10 NOTE — Assessment & Plan Note (Signed)
Patient desires amenorrhea, is distressed with return to menses. First menses in approx 8 years (mostly due to depo administration). Discussed limited options due to history of CVA. Possibility of progesterone-only IUD in future may cause atrophic endometrium; however insurance unlikely to cover at this time. Discussed return precautions for heavy bleeding and signs of anemia.

## 2021-05-10 NOTE — Progress Notes (Addendum)
    SUBJECTIVE:   CHIEF COMPLAINT / HPI:   Menses - previously used depo for approx 8 years, had no menses during that time - had CVA of right MCA fall 2021, was advised to stop depo - copper IUD inserted Sept 2021 - patient reports recent first time menses since copper IUD inserted - consisted of 3-4 days of normal flow, tapered to light flow and spotting - total of approx 2 wks - patient distressed because she desires amenorrheia  - no symptoms of fatigue, light headedness, feeling pale or sluggish  T2DM - patient due for A1c check today - last 13.5 on 07/21/20 - currently prescribed metformin 1,000 mg BID and lantus 20 units qHS  PERTINENT  PMH / PSH: HTN, T2DM, HLD, CVA of right MCA, tobacco use, chronic back pain  OBJECTIVE:   BP (!) 150/102   Pulse 90   Ht 6' (1.829 m)   Wt 259 lb 3.2 oz (117.6 kg)   LMP 04/23/2021 (Approximate) Comment: started bleeding, 1st time since IUD insert  SpO2 99%   BMI 35.15 kg/m    PHQ-9:  Depression screen [facility] 2/9 05/07/2021 09/15/2020 08/17/2020  Decreased Interest 0 3 0  Down, Depressed, Hopeless 0 0 0  PHQ - 2 Score 0 3 0  Altered sleeping 0 0 0  Tired, decreased energy 0 0 0  Change in appetite 0 0 0  Feeling bad or failure about yourself  0 0 0  Trouble concentrating 0 0 0  Moving slowly or fidgety/restless 0 0 0  Suicidal thoughts 0 0 0  PHQ-9 Score 0 3 0  Difficult doing work/chores Not difficult at all - -  Some recent data might be hidden     GAD-7: No flowsheet data found.   Physical Exam General: Awake, alert, oriented Cardiovascular: Regular rate and rhythm, S1 and S2 present, no murmurs auscultated Respiratory: Lung fields clear to auscultation bilaterally GU: normal appearing vulva without rashes or lesion, vaginal epithelium rugated and pale pink, multiparous cervix without erythema or discharge, IUD strings easily visualized extending approx 1 inch past cervix   ASSESSMENT/PLAN:   Contraceptive  management Patient desires amenorrhea, is distressed with return to menses. First menses in approx 8 years (mostly due to depo administration). Discussed limited options due to history of CVA. Possibility of progesterone-only IUD in future may cause atrophic endometrium; however insurance unlikely to cover at this time. Discussed return precautions for heavy bleeding and signs of anemia.   Type 2 diabetes mellitus ([place]) Due for A1c check today. A1c today 6.3, down from 13.5 on 07/21/20. Prescribed metformin 1,000 mg BID and lantus 20 units qHS. No changes to therapy at this time. Recommend follow up with PCP for diabetes management and medication adjustment.     Ezequiel Essex, MD [place]

## 2021-05-25 ENCOUNTER — Ambulatory Visit: Payer: Medicaid Other | Admitting: Family Medicine

## 2021-05-25 NOTE — Progress Notes (Deleted)
     SUBJECTIVE:   CHIEF COMPLAINT / HPI:   Darlene Bennett is a 45 y.o. female presents for diabetes check     ***  [place] Office Visit from 05/07/2021 in [facility]  PHQ-9 Total Score 0        Health Maintenance Due  Topic   FOOT EXAM    COVID-19 Vaccine (3 - Booster)   COLONOSCOPY (Pts 45-19yrs Insurance coverage will need to be confirmed)    PAP SMEAR-Modifier       PERTINENT  PMH / PSH:   OBJECTIVE:   There were no vitals taken for this visit.   General: Alert, no acute distress Cardio: Normal S1 and S2, RRR, no r/m/g Pulm: CTAB, normal work of breathing Abdomen: Bowel sounds normal. Abdomen soft and non-tender.  Extremities: No peripheral edema.  Neuro: Cranial nerves grossly intact   ASSESSMENT/PLAN:   No problem-specific Assessment & Plan notes found for this encounter.     Lattie Haw, MD PGY-2 [place]

## 2021-07-21 ENCOUNTER — Emergency Department (HOSPITAL_COMMUNITY)
Admission: EM | Admit: 2021-07-21 | Discharge: 2021-07-21 | Disposition: A | Discharge status: Home / Self Care | Payer: Medicaid Other | Attending: Emergency Medicine | Admitting: Emergency Medicine

## 2021-07-21 ENCOUNTER — Other Ambulatory Visit: Payer: Self-pay

## 2021-07-21 ENCOUNTER — Encounter (HOSPITAL_COMMUNITY): Payer: Self-pay

## 2021-07-21 DIAGNOSIS — Z87891 Personal history of nicotine dependence: Secondary | ICD-10-CM | POA: Diagnosis not present

## 2021-07-21 DIAGNOSIS — Z7984 Long term (current) use of oral hypoglycemic drugs: Secondary | ICD-10-CM | POA: Diagnosis not present

## 2021-07-21 DIAGNOSIS — I10 Essential (primary) hypertension: Secondary | ICD-10-CM | POA: Insufficient documentation

## 2021-07-21 DIAGNOSIS — E1129 Type 2 diabetes mellitus with other diabetic kidney complication: Secondary | ICD-10-CM | POA: Insufficient documentation

## 2021-07-21 DIAGNOSIS — Z794 Long term (current) use of insulin: Secondary | ICD-10-CM | POA: Diagnosis not present

## 2021-07-21 DIAGNOSIS — Z7982 Long term (current) use of aspirin: Secondary | ICD-10-CM | POA: Insufficient documentation

## 2021-07-21 DIAGNOSIS — N644 Mastodynia: Secondary | ICD-10-CM | POA: Diagnosis present

## 2021-07-21 MED ORDER — CEPHALEXIN 500 MG PO CAPS: 500.0000 mg | ORAL_CAPSULE | Freq: Four times a day (QID) | ORAL | 0 refills | Status: AC

## 2021-07-21 NOTE — ED Triage Notes (Signed)
Pt reports left breast nipple swelling.

## 2021-07-21 NOTE — ED Provider Notes (Signed)
[facility] EMERGENCY DEPARTMENT Provider Note   CSN: [id number] Arrival date & time: 07/21/21  0815     History Chief Complaint  Patient presents with   Breast Pain    Darlene Bennett is a 45 y.o. female.  45 year old female with left nipple pain and swelling x 2 days. No drainage, no breast masses noted. No breast history. No other complaints.       Past Medical History:  Diagnosis Date   Gestational diabetes    insulin   Gestational diabetes mellitus, antepartum 02/14/2013   History of gestational diabetes    Hypertension    preeclampsia with previous pregnancy   Scalp cyst    Slurred speech    Stroke ([place])    8/21, speech difficulties    Patient Active Problem List   Diagnosis Date Noted   Knee pain, right 09/16/2020   Encounter for insertion of copper intrauterine contraceptive device (IUD) 08/16/2020   Hyperglycemia    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 ([place])    Blister of breast with infection, left, initial encounter 07/09/2020   Morbid obesity ([place]) 05/11/2020   Chronic back pain 10/31/2018   Right sciatic nerve pain 10/31/2018   Routine cervical smear 04/23/2018   Epidermoid cyst 08/04/2016   Preventative health care 02/20/2016   Contraceptive management 05/25/2015   Type 2 diabetes mellitus ([place]) 04/24/2015   Microalbuminuria due to type 2 diabetes mellitus ([place]) 03/25/2015   Hyperlipemia 03/09/2015   Tobacco use disorder 03/09/2015   Essential hypertension 11/15/2012    Past Surgical History:  Procedure Laterality Date   CYST EXCISION N/A 09/15/2016   Procedure: EXCISION POSTERIOR SCALP CYST;  Surgeon: Clovis Riley, MD;  Location: [facility];  Service: General;  Laterality: N/A;   TUBAL LIGATION     2007     OB History     Gravida  4   Para  4   Term  3   Preterm  1   AB  0   Living  4      SAB  0   IAB  0   Ectopic  0   Multiple  0   Live Births  4            Family History  Problem Relation Age of Onset   Heart disease Mother    Hypertension Mother    Diabetes Mother    Hypertension Father    Diabetes Father     Social History   Tobacco Use   Smoking status: Former    Packs/day: 0.25    Years: 22.00    Pack years: 5.50    Types: Cigarettes    Quit date: 07/2020    Years since quitting: 1.0   Smokeless tobacco: Never   Tobacco comments:    working on it now; has cut back from 0.5  Substance Use Topics   Alcohol use: No    Alcohol/week: 0.0 standard drinks   Drug use: No    Home Medications Prior to Admission medications   Medication Sig Start Date End Date Taking? Authorizing Provider  cephALEXin (KEFLEX) 500 MG capsule Take 1 capsule (500 mg total) by mouth 4 (four) times daily for 10 days. 07/21/21 07/31/21 Yes Suella Broad A, PA-C  Accu-Chek Softclix Lancets lancets USE AS DIRECTED TO TEST BLOOD SUGAR FOUR TIMES DAILY 09/28/20   Lattie Haw, MD  aspirin EC 81 MG EC tablet Take 1 tablet (81  mg total) by mouth daily. Swallow whole. 07/23/20   Daisy Floro, DO  atorvastatin (LIPITOR) 80 MG tablet TAKE 1 TABLET(80 MG) BY MOUTH DAILY 01/22/21   Lattie Haw, MD  blood glucose meter kit and supplies KIT Dispense based on patient and insurance preference. Use up to four times daily as directed. (FOR ICD-9 250.00, 250.01). 07/24/20   Ezequiel Essex, MD  Blood Glucose Monitoring Suppl (ACCU-CHEK NANO SMARTVIEW) W/DEVICE KIT 1 Device by Does not apply route 4 (four) times daily -  before meals and at bedtime. 03/09/15   Katheren Shams, DO  ezetimibe (ZETIA) 10 MG tablet Take 1 tablet (10 mg total) by mouth daily. 09/15/20   Lattie Haw, MD  glucose blood test strip Use to check blood sugar once daily or as directed. 05/14/20   Lattie Haw, MD  Insulin Syringe/Needle U-500 (BD INSULIN SYRINGE U-500) 31G X 6MM 0.5 ML MISC 1 Units by Does not apply route daily. 11/02/20   Lattie Haw, MD  LANTUS SOLOSTAR 100 UNIT/ML  Solostar Pen INJECT 20 UNITS INTO THE SKIN EVERY DAY 11/16/20   Lattie Haw, MD  losartan (COZAAR) 25 MG tablet TAKE 1 TABLET(25 MG) BY MOUTH AT BEDTIME 01/22/21   Lattie Haw, MD  metFORMIN (GLUCOPHAGE) 1000 MG tablet Take 1 tablet (1,000 mg total) by mouth 2 (two) times daily with a meal. 12/09/20 12/09/21  Lattie Haw, MD    Allergies    Patient has no known allergies.  Review of Systems   Review of Systems  Constitutional:  Negative for fever.  Respiratory:  Negative for shortness of breath.   Cardiovascular:  Negative for chest pain.  Gastrointestinal:  Negative for abdominal pain.  Skin:  Negative for rash and wound.  Allergic/Immunologic: Negative for immunocompromised state.  Hematological:  Negative for adenopathy.   Physical Exam Updated Vital Signs BP (!) 161/98 (BP Location: Right Arm)   Pulse 69   Temp 98.5 F (36.9 C)   Resp 16   SpO2 95%   Physical Exam Vitals and nursing note reviewed. Exam conducted with a chaperone present.  Constitutional:      General: She is not in acute distress.    Appearance: She is well-developed. She is not diaphoretic.  HENT:     Head: Normocephalic and atraumatic.  Pulmonary:     Effort: Pulmonary effort is normal.  Chest:  Breasts:    Left: Tenderness present. No nipple discharge.     Comments: Tenderness with slight swelling to superior aspect of left nipple without palpable breast mass, no skin changes. Lymphadenopathy:     Upper Body:     Left upper body: No supraclavicular, axillary or pectoral adenopathy.  Skin:    General: Skin is warm and dry.  Neurological:     Mental Status: She is alert and oriented to person, place, and time.  Psychiatric:        Behavior: Behavior normal.    ED Results / Procedures / Treatments   Labs (all labs ordered are listed, but only abnormal results are displayed) Labs Reviewed - No data to display  EKG None  Radiology No results found.  Procedures Procedures    Medications Ordered in ED Medications - No data to display  ED Course  I have reviewed the triage vital signs and the nursing notes.  Pertinent labs & imaging results that were available during my care of the patient were reviewed by me and considered in my medical decision making (see chart for details).  Clinical Course as of 07/21/21 0925  Wed Jul 21, 2021  0924 45 yo female with left nipple pain as above, found to have tender area to upper nipple without skin changes or palpable mass otherwise. Will start Keflex, pt has a PCP, discussed coordination of care with PCP and breast center. Patient to call her PCP today to plan follow up.  [LM]    Clinical Course User Index [LM] Roque Lias   MDM Rules/Calculators/A&P                           Final Clinical Impression(s) / ED Diagnoses Final diagnoses:  Nipple pain    Rx / DC Orders ED Discharge Orders          Ordered    cephALEXin (KEFLEX) 500 MG capsule  4 times daily        07/21/21 0839             Tacy Learn, PA-C 07/21/21 2774    Wyvonnia Dusky, MD 07/21/21 1050

## 2021-07-21 NOTE — Discharge Instructions (Addendum)
All your primary care provider today to arrange imaging and follow up with the breast center. Take Keflex as prescribed. Take Motrin and Tylenol as needed as directed.

## 2021-07-26 ENCOUNTER — Emergency Department (HOSPITAL_COMMUNITY)
Admission: EM | Admit: 2021-07-26 | Discharge: 2021-07-26 | Discharge status: Against Medical Advice | Payer: Medicaid Other

## 2021-07-29 ENCOUNTER — Ambulatory Visit: Payer: Medicaid Other

## 2021-07-29 ENCOUNTER — Encounter: Payer: Self-pay | Admitting: Neurology

## 2021-07-29 ENCOUNTER — Other Ambulatory Visit: Payer: Self-pay

## 2021-07-29 ENCOUNTER — Ambulatory Visit: Payer: Medicaid Other | Admitting: Neurology

## 2021-07-29 VITALS — BP 154/93 | HR 85 | Ht 72.0 in | Wt 262.2 lb

## 2021-07-29 DIAGNOSIS — M25512 Pain in left shoulder: Secondary | ICD-10-CM | POA: Diagnosis not present

## 2021-07-29 MED ORDER — DICLOFENAC SODIUM 75 MG PO TBEC: 75.0000 mg | DELAYED_RELEASE_TABLET | Freq: Two times a day (BID) | ORAL | 1 refills | Status: AC

## 2021-07-29 NOTE — Progress Notes (Signed)
Reason for visit: History of cerebrovascular disease, new onset left shoulder pain  Darlene Bennett is an 45 y.o. female  History of present illness:  Darlene Bennett is a 45 year old left-handed black female with a history of hypertension and cerebrovascular disease, the patient was seen previously for a right frontal stroke that occurred on 20 July 2020.  She has done well following the stroke, but she comes in today with a new problem.  The patient noted spontaneous onset of left shoulder discomfort that occurred upon awakening about a month ago and has been persistent since that time.  She does not note any particular movement that will bring on pain, but if she tries to reach around behind her back that will increase the pain.  The pain is around the shoulder joint and in the axillary region on the left without radiation down the arm.  The patient denies any neck pain and she denies any increase in discomfort with turning her head from side to side or looking up or looking down.  She reports no weakness of the extremities, she denies any numbness or tingling.  She has no balance changes or difficulty controlling the bowels or the bladder.  It will hurt her to lay on her left shoulder at night while sleeping.  She comes to the office today for further evaluation.  Past Medical History:  Diagnosis Date   Gestational diabetes    insulin   Gestational diabetes mellitus, antepartum 02/14/2013   History of gestational diabetes    Hypertension    preeclampsia with previous pregnancy   Scalp cyst    Slurred speech    Stroke ([place])    8/21, speech difficulties    Past Surgical History:  Procedure Laterality Date   CYST EXCISION N/A 09/15/2016   Procedure: EXCISION POSTERIOR SCALP CYST;  Surgeon: Clovis Riley, MD;  Location: [facility];  Service: General;  Laterality: N/A;   TUBAL LIGATION     2007    Family History  Problem Relation Age of Onset   Heart disease Mother    Hypertension  Mother    Diabetes Mother    Hypertension Father    Diabetes Father     Social history:  reports that she quit smoking about 2 months ago. Her smoking use included cigarettes. She has a 5.50 pack-year smoking history. She has never used smokeless tobacco. She reports that she does not drink alcohol and does not use drugs.   No Known Allergies  Medications:  Prior to Admission medications   Medication Sig Start Date End Date Taking? Authorizing Provider  Accu-Chek Softclix Lancets lancets USE AS DIRECTED TO TEST BLOOD SUGAR FOUR TIMES DAILY 09/28/20  Yes Lattie Haw, MD  amLODipine-benazepril (LOTREL) 5-40 MG capsule Take 1 capsule by mouth daily. 07/19/21  Yes [provider]  aspirin EC 81 MG EC tablet Take 1 tablet (81 mg total) by mouth daily. Swallow whole. 07/23/20  Yes Milus Banister C, DO  atorvastatin (LIPITOR) 80 MG tablet TAKE 1 TABLET(80 MG) BY MOUTH DAILY 01/22/21  Yes Lattie Haw, MD  blood glucose meter kit and supplies KIT Dispense based on patient and insurance preference. Use up to four times daily as directed. (FOR ICD-9 250.00, 250.01). 07/24/20  Yes Ezequiel Essex, MD  Blood Glucose Monitoring Suppl (ACCU-CHEK NANO SMARTVIEW) W/DEVICE KIT 1 Device by Does not apply route 4 (four) times daily -  before meals and at bedtime. 03/09/15  Yes Luiz Blare Y, DO  cephALEXin (  KEFLEX) 500 MG capsule Take 1 capsule (500 mg total) by mouth 4 (four) times daily for 10 days. 07/21/21 07/31/21 Yes Tacy Learn, PA-C  ezetimibe (ZETIA) 10 MG tablet Take 1 tablet (10 mg total) by mouth daily. 09/15/20  Yes Lattie Haw, MD  glucose blood test strip Use to check blood sugar once daily or as directed. 05/14/20  Yes Lattie Haw, MD  Insulin Syringe/Needle U-500 (BD INSULIN SYRINGE U-500) 31G X 6MM 0.5 ML MISC 1 Units by Does not apply route daily. 11/02/20  Yes Lattie Haw, MD  LANTUS SOLOSTAR 100 UNIT/ML Solostar Pen INJECT 20 UNITS INTO THE SKIN EVERY DAY 11/16/20  Yes  Lattie Haw, MD  losartan (COZAAR) 25 MG tablet TAKE 1 TABLET(25 MG) BY MOUTH AT BEDTIME 01/22/21  Yes Lattie Haw, MD  metFORMIN (GLUMETZA) 1000 MG (MOD) 24 hr tablet Take 1,000 mg by mouth 2 (two) times daily. 06/25/21  Yes [provider]    ROS:  Out of a complete 14 system review of symptoms, the patient complains only of the following symptoms, and all other reviewed systems are negative.  Left shoulder pain  Blood pressure (!) 154/93, pulse 85, height 6' (1.829 m), weight 262 lb 3.2 oz (118.9 kg).  Physical Exam  General: The patient is alert and cooperative at the time of the examination.  Neuromuscular: Range of movement of the left shoulder is full, the patient does report some discomfort with internal rotation across the shoulder.  Skin: No significant peripheral edema is noted.   Neurologic Exam  Mental status: The patient is alert and oriented x 3 at the time of the examination. The patient has apparent normal recent and remote memory, with an apparently normal attention span and concentration ability.   Cranial nerves: Facial symmetry is present. Speech is normal, no aphasia or dysarthria is noted. Extraocular movements are full. Visual fields are full.  Motor: The patient has good strength in all 4 extremities.  Sensory examination: Soft touch sensation is symmetric on the face, arms, and legs.  Pinprick and vibration sensation is symmetric throughout.  Coordination: The patient has good finger-nose-finger and heel-to-shin bilaterally.  Gait and station: The patient has a normal gait. Tandem gait is normal. Romberg is negative. No drift is seen.  Reflexes: Deep tendon reflexes are symmetric.   Assessment/Plan:  1.  Left shoulder discomfort, etiology unclear  2.  History of cerebrovascular disease  The patient has nondescript dull aching pain involving the left shoulder with some worsened pain with internal rotation of the shoulder.  The pain  appears to be neuromuscular in nature, I will give the patient a 4-week trial on diclofenac taking 75 mg twice daily, I will set the patient up for physical therapy for the shoulder.  She will contact our office if she is not improving, otherwise she will follow-up here in 3 months.  In the future, she can be followed through Dr. Krista Blue.  Jill Alexanders MD 07/29/2021 4:27 PM  [facility] [address] [place], [place] [address]  Phone [phone] Fax [phone]

## 2021-07-31 ENCOUNTER — Emergency Department (HOSPITAL_COMMUNITY)
Admission: EM | Admit: 2021-07-31 | Discharge: 2021-07-31 | Disposition: A | Discharge status: Home / Self Care | Payer: Medicaid Other | Attending: Emergency Medicine | Admitting: Emergency Medicine

## 2021-07-31 ENCOUNTER — Encounter (HOSPITAL_COMMUNITY): Payer: Self-pay

## 2021-07-31 ENCOUNTER — Emergency Department (HOSPITAL_COMMUNITY): Payer: Medicaid Other

## 2021-07-31 ENCOUNTER — Other Ambulatory Visit: Payer: Self-pay | Admitting: Neurology

## 2021-07-31 ENCOUNTER — Other Ambulatory Visit: Payer: Self-pay

## 2021-07-31 DIAGNOSIS — M79602 Pain in left arm: Secondary | ICD-10-CM

## 2021-07-31 DIAGNOSIS — Z794 Long term (current) use of insulin: Secondary | ICD-10-CM | POA: Diagnosis not present

## 2021-07-31 DIAGNOSIS — Z79899 Other long term (current) drug therapy: Secondary | ICD-10-CM | POA: Diagnosis not present

## 2021-07-31 DIAGNOSIS — I10 Essential (primary) hypertension: Secondary | ICD-10-CM | POA: Insufficient documentation

## 2021-07-31 DIAGNOSIS — M25512 Pain in left shoulder: Secondary | ICD-10-CM

## 2021-07-31 DIAGNOSIS — E119 Type 2 diabetes mellitus without complications: Secondary | ICD-10-CM | POA: Insufficient documentation

## 2021-07-31 DIAGNOSIS — M79632 Pain in left forearm: Secondary | ICD-10-CM | POA: Insufficient documentation

## 2021-07-31 DIAGNOSIS — Z87891 Personal history of nicotine dependence: Secondary | ICD-10-CM | POA: Diagnosis not present

## 2021-07-31 DIAGNOSIS — Z7982 Long term (current) use of aspirin: Secondary | ICD-10-CM | POA: Insufficient documentation

## 2021-07-31 DIAGNOSIS — Z7984 Long term (current) use of oral hypoglycemic drugs: Secondary | ICD-10-CM | POA: Insufficient documentation

## 2021-07-31 LAB — CBC WITH DIFFERENTIAL/PLATELET
Abs Immature Granulocytes: 0.01 10*3/uL (ref 0.00–0.07)
Basophils Absolute: 0.1 10*3/uL (ref 0.0–0.1)
Basophils Relative: 1 %
Eosinophils Absolute: 0.1 10*3/uL (ref 0.0–0.5)
Eosinophils Relative: 2 %
HCT: 45.1 % (ref 36.0–46.0)
Hemoglobin: 15.1 g/dL — ABNORMAL HIGH (ref 12.0–15.0)
Immature Granulocytes: 0 %
Lymphocytes Relative: 62 %
Lymphs Abs: 4.1 10*3/uL — ABNORMAL HIGH (ref 0.7–4.0)
MCH: 30.3 pg (ref 26.0–34.0)
MCHC: 33.5 g/dL (ref 30.0–36.0)
MCV: 90.6 fL (ref 80.0–100.0)
Monocytes Absolute: 0.3 10*3/uL (ref 0.1–1.0)
Monocytes Relative: 5 %
Neutro Abs: 1.9 10*3/uL (ref 1.7–7.7)
Neutrophils Relative %: 30 %
Platelets: 312 10*3/uL (ref 150–400)
RBC: 4.98 MIL/uL (ref 3.87–5.11)
RDW: 12.9 % (ref 11.5–15.5)
WBC: 6.5 10*3/uL (ref 4.0–10.5)
nRBC: 0 % (ref 0.0–0.2)

## 2021-07-31 LAB — BASIC METABOLIC PANEL
Anion gap: 7 (ref 5–15)
BUN: 10 mg/dL (ref 6–20)
CO2: 25 mmol/L (ref 22–32)
Calcium: 9.1 mg/dL (ref 8.9–10.3)
Chloride: 106 mmol/L (ref 98–111)
Creatinine, Ser: 0.87 mg/dL (ref 0.44–1.00)
GFR, Estimated: >60 mL/min (ref >60)
Glucose, Bld: 83 mg/dL (ref 70–99)
Potassium: 3.7 mmol/L (ref 3.5–5.1)
Sodium: 138 mmol/L (ref 135–145)

## 2021-07-31 MED ORDER — OXYCODONE-ACETAMINOPHEN 5-325 MG PO TABS
1.0000 | ORAL_TABLET | Freq: Once | ORAL | Status: AC
  Administered 2021-07-31: 1 via ORAL
  Filled 2021-07-31: qty 1

## 2021-07-31 NOTE — ED Notes (Signed)
Pt walked out to the lobby, did not return

## 2021-07-31 NOTE — ED Provider Notes (Signed)
Emergency Medicine Provider Triage Evaluation Note  Keondra Randeep Colglazier , a 45 y.o. female  was evaluated in triage.  Pt complains of left shoulder pain has been ongoing for 1 month with no significant changes.  Has been using diclofenac per neurology note she states that she has not has any improvement with this.  Denies any traumas no numbness or weakness.  Review of Systems  Positive: Left arm pain Negative: Chest pain  Physical Exam  BP (!) 154/103 (BP Location: Right Arm)   Pulse 74   Temp 98.9 F (37.2 C) (Oral)   Resp 18   SpO2 95%  Gen:   Awake, no distress   Resp:  Normal effort  MSK:   Moves extremities without difficulty  Other:  Some mild tenderness to palpation of the left shoulder diffusely.  Shoulder is not squared off for range of motion of left shoulder with some hesitancy given discomfort.  Medical Decision Making  Medically screening exam initiated at 3:40 PM.  Appropriate orders placed.  Basilia Marenda Bashore was informed that the remainder of the evaluation will be completed by another provider, this initial triage assessment does not replace that evaluation, and the importance of remaining in the ED until their evaluation is complete.     Pati Gallo [place], [place] 07/31/21 1541    Lacretia Leigh, MD 08/01/21 [phone]

## 2021-07-31 NOTE — ED Triage Notes (Signed)
Pt c/o L arm pain x36mo starts in shoulder & goes to elbow. 8/10 aching, denies injury. CMS intact distally

## 2021-08-01 NOTE — ED Provider Notes (Signed)
[facility] EMERGENCY DEPARTMENT Provider Note   CSN: [id number] Arrival date & time: 07/31/21  1459     History Chief Complaint  Patient presents with   Arm Pain    L    Darlene Bennett is a 45 y.o. female who presents with concern for 1 month of intermittent left-sided arm pain that extends from the shoulder down to the elbow without injury.  Has not tried anything except for topical cream and diclofenac in the outpatient setting.  States that she does not often take oral medication for the pain.  Denies any numbness, ting, weakness in the hand, denies any warmth, injury to the area, swelling, or decreased range of motion.  Denies any fevers or chills.  I personally read this patient's medical records.  She is history of hypertension, gestational diabetes, stroke.  She is not anticoagulated.  HPI     Past Medical History:  Diagnosis Date   Gestational diabetes    insulin   Gestational diabetes mellitus, antepartum 02/14/2013   History of gestational diabetes    Hypertension    preeclampsia with previous pregnancy   Scalp cyst    Slurred speech    Stroke ([place])    8/21, speech difficulties    Patient Active Problem List   Diagnosis Date Noted   Knee pain, right 09/16/2020   Encounter for insertion of copper intrauterine contraceptive device (IUD) 08/16/2020   Hyperglycemia    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 ([place])    Blister of breast with infection, left, initial encounter 07/09/2020   Morbid obesity ([place]) 05/11/2020   Chronic back pain 10/31/2018   Right sciatic nerve pain 10/31/2018   Routine cervical smear 04/23/2018   Epidermoid cyst 08/04/2016   Preventative health care 02/20/2016   Contraceptive management 05/25/2015   Type 2 diabetes mellitus ([place]) 04/24/2015   Microalbuminuria due to type 2 diabetes mellitus ([place]) 03/25/2015   Hyperlipemia 03/09/2015   Tobacco use disorder 03/09/2015    Essential hypertension 11/15/2012    Past Surgical History:  Procedure Laterality Date   CYST EXCISION N/A 09/15/2016   Procedure: EXCISION POSTERIOR SCALP CYST;  Surgeon: Clovis Riley, MD;  Location: [facility];  Service: General;  Laterality: N/A;   TUBAL LIGATION     2007     OB History     Gravida  4   Para  4   Term  3   Preterm  1   AB  0   Living  4      SAB  0   IAB  0   Ectopic  0   Multiple  0   Live Births  4           Family History  Problem Relation Age of Onset   Heart disease Mother    Hypertension Mother    Diabetes Mother    Hypertension Father    Diabetes Father     Social History   Tobacco Use   Smoking status: Former    Packs/day: 0.25    Years: 22.00    Pack years: 5.50    Types: Cigarettes    Quit date: 05/2021    Years since quitting: 0.2   Smokeless tobacco: Never   Tobacco comments:    working on it now; has cut back from 0.5, quit 05/2021  Substance Use Topics   Alcohol use: No    Alcohol/week: 0.0 standard drinks   Drug use:  No    Home Medications Prior to Admission medications   Medication Sig Start Date End Date Taking? Authorizing Provider  Accu-Chek Softclix Lancets lancets USE AS DIRECTED TO TEST BLOOD SUGAR FOUR TIMES DAILY 09/28/20   Lattie Haw, MD  amLODipine-benazepril (LOTREL) 5-40 MG capsule Take 1 capsule by mouth daily. 07/19/21   [provider]  aspirin EC 81 MG EC tablet Take 1 tablet (81 mg total) by mouth daily. Swallow whole. 07/23/20   Daisy Floro, DO  atorvastatin (LIPITOR) 80 MG tablet TAKE 1 TABLET(80 MG) BY MOUTH DAILY 01/22/21   Lattie Haw, MD  blood glucose meter kit and supplies KIT Dispense based on patient and insurance preference. Use up to four times daily as directed. (FOR ICD-9 250.00, 250.01). 07/24/20   Ezequiel Essex, MD  Blood Glucose Monitoring Suppl (ACCU-CHEK NANO SMARTVIEW) W/DEVICE KIT 1 Device by Does not apply route 4 (four) times daily -  before  meals and at bedtime. 03/09/15   Katheren Shams, DO  diclofenac (VOLTAREN) 75 MG EC tablet Take 1 tablet (75 mg total) by mouth 2 (two) times daily. 07/29/21   Kathrynn Ducking, MD  ezetimibe (ZETIA) 10 MG tablet Take 1 tablet (10 mg total) by mouth daily. 09/15/20   Lattie Haw, MD  glucose blood test strip Use to check blood sugar once daily or as directed. 05/14/20   Lattie Haw, MD  Insulin Syringe/Needle U-500 (BD INSULIN SYRINGE U-500) 31G X 6MM 0.5 ML MISC 1 Units by Does not apply route daily. 11/02/20   Lattie Haw, MD  LANTUS SOLOSTAR 100 UNIT/ML Solostar Pen INJECT 20 UNITS INTO THE SKIN EVERY DAY 11/16/20   Lattie Haw, MD  losartan (COZAAR) 25 MG tablet TAKE 1 TABLET(25 MG) BY MOUTH AT BEDTIME 01/22/21   Lattie Haw, MD  metFORMIN (GLUMETZA) 1000 MG (MOD) 24 hr tablet Take 1,000 mg by mouth 2 (two) times daily. 06/25/21   [provider]    Allergies    Patient has no known allergies.  Review of Systems   Review of Systems  Constitutional: Negative.   HENT: Negative.    Eyes: Negative.   Respiratory: Negative.    Cardiovascular: Negative.   Gastrointestinal: Negative.   Genitourinary: Negative.   Musculoskeletal:  Positive for myalgias.  Skin: Negative.   Neurological: Negative.    Physical Exam Updated Vital Signs BP (!) 163/105   Pulse 61   Temp 98.9 F (37.2 C) (Oral)   Resp 18   SpO2 98%   Physical Exam Vitals and nursing note reviewed.  HENT:     Head: Normocephalic and atraumatic.     Nose: Nose normal. No congestion.     Mouth/Throat:     Mouth: Mucous membranes are moist.     Pharynx: No oropharyngeal exudate or posterior oropharyngeal erythema.  Eyes:     General:        Right eye: No discharge.        Left eye: No discharge.     Extraocular Movements: Extraocular movements intact.     Conjunctiva/sclera: Conjunctivae normal.     Pupils: Pupils are equal, round, and reactive to light.  Cardiovascular:     Rate and Rhythm: Normal  rate and regular rhythm.     Pulses: Normal pulses.     Heart sounds: Normal heart sounds. No murmur heard. Pulmonary:     Effort: Pulmonary effort is normal. No respiratory distress.     Breath sounds: Normal breath sounds. No wheezing or  rales.  Abdominal:     General: Bowel sounds are normal. There is no distension.     Palpations: Abdomen is soft.     Tenderness: There is no abdominal tenderness. There is no guarding or rebound.  Musculoskeletal:        General: No deformity.     Right shoulder: Normal.     Left shoulder: Tenderness present. No swelling, deformity, bony tenderness or crepitus. Normal range of motion. Normal pulse.     Right upper arm: Normal.     Left upper arm: Tenderness present. No edema, deformity, lacerations or bony tenderness.     Right elbow: Normal.     Left elbow: Normal.     Right forearm: Normal.     Left forearm: Tenderness present. No swelling, edema, deformity, lacerations or bony tenderness.     Right wrist: Normal.     Left wrist: Normal.     Right hand: Normal.     Left hand: Normal.     Cervical back: Neck supple.     Right lower leg: No edema.     Left lower leg: No edema.  Skin:    General: Skin is warm and dry.     Capillary Refill: Capillary refill takes less than 2 seconds.  Neurological:     General: No focal deficit present.     Mental Status: She is alert. Mental status is at baseline.     Sensory: Sensation is intact.     Motor: Motor function is intact.  Psychiatric:        Mood and Affect: Mood normal.    ED Results / Procedures / Treatments   Labs (all labs ordered are listed, but only abnormal results are displayed) Labs Reviewed  CBC WITH DIFFERENTIAL/PLATELET - Abnormal; Notable for the following components:      Result Value   Hemoglobin 15.1 (*)    Lymphs Abs 4.1 (*)    All other components within normal limits  BASIC METABOLIC PANEL    EKG None  Radiology DG Shoulder Left  Result Date:  07/31/2021 CLINICAL DATA:  L arm pain x23mo, starts in shoulder AND goes to elbow. 8/10 aching, denies injury. EXAM: LEFT SHOULDER - 2+ VIEW COMPARISON:  None. FINDINGS: There is no evidence of fracture or dislocation. There is no evidence of arthropathy or other focal bone abnormality. Soft tissues are unremarkable. IMPRESSION: Negative. Electronically Signed   By: Iven Finn M.D.   On: 07/31/2021 16:34    Procedures Procedures   Medications Ordered in ED Medications  oxyCODONE-acetaminophen (PERCOCET/ROXICET) 5-325 MG per tablet 1 tablet (1 tablet Oral Given 07/31/21 1942)    ED Course  I have reviewed the triage vital signs and the nursing notes.  Pertinent labs & imaging results that were available during my care of the patient were reviewed by me and considered in my medical decision making (see chart for details).    MDM Rules/Calculators/A&P                         45 year old female presents with concern for 1 month of intermittent left Arm pain extending from the shoulder down into the elbow.  Differential diagnosis includes limited to cellulitis, acute fracture dislocation, tenosynovitis, DVT, phlebitis, gout.  Hypertensive on intake, vital signs otherwise normal.  Her pulm exam is normal, Donnell exam is benign.  Patient with tenderness palpation of the shoulder, upper arm, and forearm without deformity, swelling, redness, or signs of  trauma.  There is some mild warmth to the touch of the left forearm relative to the right.  DVT study considered, however patient without consistent HPI or recent risk factors; additionally ultrasonography no longer available at the hour this patient was examined in the emergency department.  We will proceed with basic labs for evaluation of possible cellulitis.  CBC unremarkable, BMP unremarkable.  Plain film of the shoulder obtained in triage was negative for acute osseous abnormality.  When I returned to the bedside to discuss treatment plan  with patient as well as her test results, she had eloped from the emergency department.  She may return to the ER anytime.  This chart was dictated using voice recognition software, Dragon. Despite the best efforts of this provider to proofread and correct errors, errors may still occur which can change documentation meaning.   Final Clinical Impression(s) / ED Diagnoses Final diagnoses:  None    Rx / DC Orders ED Discharge Orders     None        Aura Dials 08/01/21 0051    Hayden Rasmussen, MD 08/01/21 1109

## 2021-08-02 ENCOUNTER — Telehealth: Payer: Self-pay

## 2021-08-02 NOTE — Telephone Encounter (Signed)
Effective dates: 08/02/2021 - 08/02/2022

## 2021-08-02 NOTE — Telephone Encounter (Signed)
Received a PA request for Diclofenac Sodium '75mg'$ . I submitted on [place] [employer] and it was Approved.   Confirmation [id number] Prior Approval [id number]

## 2021-08-03 ENCOUNTER — Encounter (HOSPITAL_COMMUNITY): Payer: Self-pay

## 2021-08-03 ENCOUNTER — Emergency Department (HOSPITAL_COMMUNITY)
Admission: EM | Admit: 2021-08-03 | Discharge: 2021-08-03 | Disposition: A | Discharge status: Home / Self Care | Payer: Medicaid Other | Attending: Emergency Medicine | Admitting: Emergency Medicine

## 2021-08-03 ENCOUNTER — Other Ambulatory Visit: Payer: Self-pay

## 2021-08-03 DIAGNOSIS — Z7982 Long term (current) use of aspirin: Secondary | ICD-10-CM | POA: Diagnosis not present

## 2021-08-03 DIAGNOSIS — Z7984 Long term (current) use of oral hypoglycemic drugs: Secondary | ICD-10-CM | POA: Diagnosis not present

## 2021-08-03 DIAGNOSIS — E119 Type 2 diabetes mellitus without complications: Secondary | ICD-10-CM | POA: Insufficient documentation

## 2021-08-03 DIAGNOSIS — M25512 Pain in left shoulder: Secondary | ICD-10-CM

## 2021-08-03 DIAGNOSIS — I10 Essential (primary) hypertension: Secondary | ICD-10-CM | POA: Insufficient documentation

## 2021-08-03 DIAGNOSIS — Z87891 Personal history of nicotine dependence: Secondary | ICD-10-CM | POA: Insufficient documentation

## 2021-08-03 DIAGNOSIS — Z794 Long term (current) use of insulin: Secondary | ICD-10-CM | POA: Insufficient documentation

## 2021-08-03 DIAGNOSIS — Z79899 Other long term (current) drug therapy: Secondary | ICD-10-CM | POA: Diagnosis not present

## 2021-08-03 NOTE — Discharge Instructions (Addendum)
Return for any problem.  Use Diclofenac as previously prescribed.  You may use ibuprofen as well  - however, you should NOT take diclofenac and ibuprofen together at the same time.  Follow-up closely with your regular care providers as instructed.

## 2021-08-03 NOTE — ED Provider Notes (Signed)
[facility] DEPT Provider Note   CSN: [id number] Arrival date & time: 08/03/21  1138     History Chief Complaint  Patient presents with   Shoulder Pain    Darlene Bennett is a 45 y.o. female.  45 year old female with prior medical history as detailed below presents for evaluation.  Patient complains of persistent left shoulder discomfort and pain.  Patient was seen last week for same complaint by her neurologist.  She was prescribed diclofenac.  She has not yet taken this medication.  She was also seen by the [facility] ED for same complaint.  She apparently eloped prior to her discharge at that visit.   Patient denies associated injury.  She denies fever.  She denies weakness in her left arm.  She reports that pain is increased with movement of the left shoulder.   The history is provided by the patient.  Shoulder Pain Location:  Shoulder Shoulder location:  L shoulder Injury: no   Pain details:    Quality:  Aching   Radiates to:  Does not radiate   Severity:  Moderate   Onset quality:  Gradual   Duration:  6 weeks   Timing:  Constant   Progression:  Unchanged     Past Medical History:  Diagnosis Date   Gestational diabetes    insulin   Gestational diabetes mellitus, antepartum 02/14/2013   History of gestational diabetes    Hypertension    preeclampsia with previous pregnancy   Scalp cyst    Slurred speech    Stroke ([place])    8/21, speech difficulties    Patient Active Problem List   Diagnosis Date Noted   Knee pain, right 09/16/2020   Encounter for insertion of copper intrauterine contraceptive device (IUD) 08/16/2020   Hyperglycemia    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 ([place])    Blister of breast with infection, left, initial encounter 07/09/2020   Morbid obesity ([place]) 05/11/2020   Chronic back pain 10/31/2018   Right sciatic nerve pain 10/31/2018   Routine cervical smear  04/23/2018   Epidermoid cyst 08/04/2016   Preventative health care 02/20/2016   Contraceptive management 05/25/2015   Type 2 diabetes mellitus ([place]) 04/24/2015   Microalbuminuria due to type 2 diabetes mellitus ([place]) 03/25/2015   Hyperlipemia 03/09/2015   Tobacco use disorder 03/09/2015   Essential hypertension 11/15/2012    Past Surgical History:  Procedure Laterality Date   CYST EXCISION N/A 09/15/2016   Procedure: EXCISION POSTERIOR SCALP CYST;  Surgeon: Clovis Riley, MD;  Location: [facility];  Service: General;  Laterality: N/A;   TUBAL LIGATION     2007     OB History     Gravida  4   Para  4   Term  3   Preterm  1   AB  0   Living  4      SAB  0   IAB  0   Ectopic  0   Multiple  0   Live Births  4           Family History  Problem Relation Age of Onset   Heart disease Mother    Hypertension Mother    Diabetes Mother    Hypertension Father    Diabetes Father     Social History   Tobacco Use   Smoking status: Former    Packs/day: 0.25    Years: 22.00    Pack years:  5.50    Types: Cigarettes    Quit date: 05/2021    Years since quitting: 0.2   Smokeless tobacco: Never   Tobacco comments:    working on it now; has cut back from 0.5, quit 05/2021  Substance Use Topics   Alcohol use: No    Alcohol/week: 0.0 standard drinks   Drug use: No    Home Medications Prior to Admission medications   Medication Sig Start Date End Date Taking? Authorizing Provider  Accu-Chek Softclix Lancets lancets USE AS DIRECTED TO TEST BLOOD SUGAR FOUR TIMES DAILY 09/28/20   Lattie Haw, MD  amLODipine-benazepril (LOTREL) 5-40 MG capsule Take 1 capsule by mouth daily. 07/19/21   [provider]  aspirin EC 81 MG EC tablet Take 1 tablet (81 mg total) by mouth daily. Swallow whole. 07/23/20   Daisy Floro, DO  atorvastatin (LIPITOR) 80 MG tablet TAKE 1 TABLET(80 MG) BY MOUTH DAILY 01/22/21   Lattie Haw, MD  blood glucose meter kit and  supplies KIT Dispense based on patient and insurance preference. Use up to four times daily as directed. (FOR ICD-9 250.00, 250.01). 07/24/20   Ezequiel Essex, MD  Blood Glucose Monitoring Suppl (ACCU-CHEK NANO SMARTVIEW) W/DEVICE KIT 1 Device by Does not apply route 4 (four) times daily -  before meals and at bedtime. 03/09/15   Katheren Shams, DO  diclofenac (VOLTAREN) 75 MG EC tablet Take 1 tablet (75 mg total) by mouth 2 (two) times daily. 07/29/21   Kathrynn Ducking, MD  ezetimibe (ZETIA) 10 MG tablet Take 1 tablet (10 mg total) by mouth daily. 09/15/20   Lattie Haw, MD  glucose blood test strip Use to check blood sugar once daily or as directed. 05/14/20   Lattie Haw, MD  Insulin Syringe/Needle U-500 (BD INSULIN SYRINGE U-500) 31G X 6MM 0.5 ML MISC 1 Units by Does not apply route daily. 11/02/20   Lattie Haw, MD  LANTUS SOLOSTAR 100 UNIT/ML Solostar Pen INJECT 20 UNITS INTO THE SKIN EVERY DAY 11/16/20   Lattie Haw, MD  losartan (COZAAR) 25 MG tablet TAKE 1 TABLET(25 MG) BY MOUTH AT BEDTIME 01/22/21   Lattie Haw, MD  metFORMIN (GLUMETZA) 1000 MG (MOD) 24 hr tablet Take 1,000 mg by mouth 2 (two) times daily. 06/25/21   [provider]    Allergies    Patient has no known allergies.  Review of Systems   Review of Systems  All other systems reviewed and are negative.  Physical Exam Updated Vital Signs BP (!) 162/110 (BP Location: Left Arm)   Pulse 70   Temp 98.1 F (36.7 C) (Oral)   Resp 17   Ht 6' (1.829 m)   Wt 121.6 kg   SpO2 98%   BMI 36.35 kg/m   Physical Exam Vitals and nursing note reviewed.  Constitutional:      General: She is not in acute distress.    Appearance: Normal appearance. She is well-developed.  HENT:     Head: Normocephalic and atraumatic.  Eyes:     Conjunctiva/sclera: Conjunctivae normal.     Pupils: Pupils are equal, round, and reactive to light.  Cardiovascular:     Rate and Rhythm: Normal rate and regular rhythm.     Heart  sounds: Normal heart sounds.  Pulmonary:     Effort: Pulmonary effort is normal. No respiratory distress.     Breath sounds: Normal breath sounds.  Abdominal:     General: There is no distension.  Palpations: Abdomen is soft.     Tenderness: There is no abdominal tenderness.  Musculoskeletal:        General: Tenderness present. No deformity. Normal range of motion.     Cervical back: Normal range of motion and neck supple.     Comments: Mild diffuse tenderness to palpation along the primarily anterior aspect of the left shoulder.  Full active range of motion of the left shoulder noted.  Distal left upper extremity is neurovascular intact.  No overlying erythema or ecchymosis or skin lesion noted to the left shoulder or left upper arm.  Skin:    General: Skin is warm and dry.  Neurological:     General: No focal deficit present.     Mental Status: She is alert and oriented to person, place, and time.    ED Results / Procedures / Treatments   Labs (all labs ordered are listed, but only abnormal results are displayed) Labs Reviewed - No data to display  EKG None  Radiology No results found.  Procedures Procedures   Medications Ordered in ED Medications - No data to display  ED Course  I have reviewed the triage vital signs and the nursing notes.  Pertinent labs & imaging results that were available during my care of the patient were reviewed by me and considered in my medical decision making (see chart for details).    MDM Rules/Calculators/A&P                           MDM  MSE complete  Darlene Bennett was evaluated in Emergency Department on 08/03/2021 for the symptoms described in the history of present illness. She was evaluated in the context of the global COVID-19 pandemic, which necessitated consideration that the patient might be at risk for infection with the SARS-CoV-2 virus that causes COVID-19. Institutional protocols and algorithms that pertain  to the evaluation of patients at risk for COVID-19 are in a state of rapid change based on information released by regulatory bodies including the CDC and federal and state organizations. These policies and algorithms were followed during the patient's care in the ED.  Patient is presenting with complaint of left shoulder discomfort.  This appears to be a longstanding problem.  The patient was seen by her neurologist and in the ED for same complaint last week.  She appears to desire reassurance on her visit today that there is nothing else going on.  Exam is reassuring.  Patient's described symptoms are most likely musculoskeletal in nature.    Patient has previously been prescribed diclofenac.  She is advised to use this as prescribed for symptomatic relief.  Importance of close follow-up is stressed.  Strict return precautions given and understood.   Final Clinical Impression(s) / ED Diagnoses Final diagnoses:  Acute pain of left shoulder    Rx / DC Orders ED Discharge Orders     None        Valarie Merino, MD 08/03/21 1356

## 2021-08-03 NOTE — ED Notes (Signed)
Pt provided with a warm blanket.  

## 2021-08-03 NOTE — ED Triage Notes (Signed)
Pt c/o L shoulder pain for one month. Evaluated at [facility] 8/27 for same. Pt states pain and paresthesia throughout L arm. Denies known injury to site. PMS intact

## 2021-08-04 ENCOUNTER — Encounter: Payer: Self-pay | Admitting: Occupational Therapy

## 2021-08-04 ENCOUNTER — Encounter: Payer: Self-pay | Admitting: Physical Therapy

## 2021-08-04 ENCOUNTER — Ambulatory Visit: Payer: Medicaid Other | Attending: Neurology | Admitting: Physical Therapy

## 2021-08-04 ENCOUNTER — Ambulatory Visit: Payer: Medicaid Other | Admitting: Occupational Therapy

## 2021-08-04 DIAGNOSIS — M6281 Muscle weakness (generalized): Secondary | ICD-10-CM

## 2021-08-04 DIAGNOSIS — M25512 Pain in left shoulder: Secondary | ICD-10-CM | POA: Diagnosis present

## 2021-08-04 DIAGNOSIS — R293 Abnormal posture: Secondary | ICD-10-CM | POA: Diagnosis present

## 2021-08-04 DIAGNOSIS — M5412 Radiculopathy, cervical region: Secondary | ICD-10-CM | POA: Diagnosis not present

## 2021-08-04 NOTE — Therapy (Signed)
[facility] [place]. [place], [place], [address] Phone: [phone]   Fax:  [phone]  Occupational Therapy Evaluation  Patient Details  Name: Darlene Bennett MRN: [id number] Date of Birth: 03/01/1976 No data recorded  Encounter Date: 08/04/2021   OT End of Session - 08/04/21 1448     Visit Number 1    Number of Visits 1    Authorization Type Medicaid [place]    Authorization Time Period CCME    Authorization - Visit Number 2    Authorization - Number of Visits 27   Max VL per year   OT Start Time [id number]    OT Stop Time 1508    OT Time Calculation (min) 23 min    Activity Tolerance Patient limited by pain    Behavior During Therapy [facility] for tasks assessed/performed            Past Medical History:  Diagnosis Date   Gestational diabetes    insulin   Gestational diabetes mellitus, antepartum 02/14/2013   History of gestational diabetes    Hypertension    preeclampsia with previous pregnancy   Scalp cyst    Slurred speech    Stroke ([place])    8/21, speech difficulties    Past Surgical History:  Procedure Laterality Date   CYST EXCISION N/A 09/15/2016   Procedure: EXCISION POSTERIOR SCALP CYST;  Surgeon: Clovis Riley, MD;  Location: [facility];  Service: General;  Laterality: N/A;   TUBAL LIGATION     2007    There were no vitals filed for this visit.   Subjective Assessment - 08/04/21 1446     Subjective  Pt arrives to session w/ primary complaint of L shoulder pain that started approx 1 month ago, which has been progressively getting worse. Also reports intermittent "tingling" sensation in L thumb and index finger. Pt states she went to the ER yesterday for 10/10 pain, initially believing it may have been related to her prior CVA (07/20/20) and was d/c w/ recommendation for close follow-up.    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 Yes    Pain Score 9     Pain Location Shoulder    Pain Orientation Left    Pain Descriptors / Indicators Sharp;Shooting    Pain Type Acute pain    Pain Radiating Towards From shoulder to hand    Pain Onset More than a month ago    Pain Frequency Intermittent    Pain Relieving Factors Medication             [facility] OT Assessment - 08/04/21 1448       Assessment   Medical Diagnosis Acute pain of left shoulder    Onset Date/Surgical Date 07/04/21    Hand Dominance Left    Prior Therapy No      Precautions   Precautions None      Balance Screen   Has the patient fallen in the past 6 months No      Home  Environment   Type of Home Aartment    Home Layout Two level   Reports difficulty w/ stairs due to pain in knees   Bathroom Shower/Tub Tub/Shower unit    Lives With Family   Husband and 2 children     Prior Function   Level of Independence Independent  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[employer], cleaning, standing    Leisure cooking      ADL   Eating/Feeding Modified independent   Uses R hand   Grooming Modified independent    Upper Body Bathing Minimal assistance   Due to L arm pain   Lower Body Bathing Minimal assistance    Upper Body Dressing Minimal assistance   Donning shirt   Lower Body Dressing Minimal assistance   Pulling pants up   Patent examiner - Clothing Manipulation Modified independent    [place] Transfer Modified independent      IADL   Prior Level of Function Light Housekeeping Independent    Light Housekeeping All laundry must be done by others;Needs help with all home maintenance tasks    Meal Prep Plans, prepares and serves adequate meals independently   Receives assist w/ increased pain   Programmer, applications own vehicle      Cognition   Overall Cognitive Status Within Functional Limits for tasks assessed      Sensation    Light Touch Appears Intact    Additional Comments Paresthesia in thumb and index finger of L hand      Coordination   Gross Motor Movements are Fluid and Coordinated Yes    Fine Motor Movements are Fluid and Coordinated Yes    9 Hole Peg Test Right;Left    Right 9 Hole Peg Test 21 sec    Left 9 Hole Peg Test 27 sec    Box and Blocks RUE 56; LUE 48 blocks      ROM / Strength   AROM / PROM / Strength AROM;Strength      AROM   Overall AROM  Within functional limits for tasks performed    Overall AROM Comments BUEs (shoulder, elbow, forearm, wrist); slightly decreased L shoulder flexion/overhead reach      Strength   Overall Strength Within functional limits for tasks performed      Hand Function   Right Hand Gross Grasp Functional    Right Hand Grip (lbs) 97    Right Hand Lateral Pinch 14 lbs    Right Hand 3 Point Pinch 20 lbs    Left Hand Gross Grasp Functional    Left Hand Grip (lbs) 60    Left Hand Lateral Pinch 13 lbs    Left 3 point pinch 14 lbs             OT Education - 08/04/21 1820     Education Details Education provided on role and purpose of OT    Person(s) Educated Patient    Methods Explanation    Comprehension Verbalized understanding             OT Short Term Goals - 08/04/21 1828       OT SHORT TERM GOAL #1   Title Pt will verbalize understanding of role/purpose of occupational therapy services and be able to independently identify if/when she may need to seek an add'l referral for services to assist w/ participation in functional activities    Status Achieved             Plan - 08/04/21 1821     Clinical Impression Statement Pt is a 45 y/o female who presents to OP OT due to significant L shoulder pain that has persisted for approx 1 month. Pt currently lives with her husband and 2 children  in an apartment and is working part-time. PMHx includes CVA in August of 2021, HTN, DMT2, and hx of tobacco use. Pt demo's AROM, strength, grip, [place],  and [place] WFL; due to absence of functional deficits unrelated to current pain level and referral for PT services, skilled occupational therapy services are not warranted at this time. OT discussed this with?pt?and she was receptive. Pt encouraged to call back with any specific changes or development of limitations during functional activities, as well as to continue follow-up with physician.    OT Occupational Profile and History Problem Focused Assessment - Including review of records relating to presenting problem    Occupational performance deficits (Please refer to evaluation for details): ADL's;IADL's;Rest and Sleep;Work;Leisure;Social Participation    Body Structure / Function / Physical Skills ADL;Flexibility;Decreased knowledge of precautions;UE functional use;Sensation;Body mechanics;Pain;Strength;IADL    Rehab Potential Good    Clinical Decision Making Limited treatment options, no task modification necessary    Comorbidities Affecting Occupational Performance: May have comorbidities impacting occupational performance    Modification or Assistance to Complete Evaluation  No modification of tasks or assist necessary to complete eval    OT Frequency One time visit    OT Treatment/Interventions Patient/family education    Plan Skilled OT services are not warranted at this time; pt encouraged to call back w/ any changes in functional status or limitations.    Consulted and Agree with Plan of Care Patient             Patient will benefit from skilled therapeutic intervention in order to improve the following deficits and impairments:   Body Structure / Function / Physical Skills: ADL, Flexibility, Decreased knowledge of precautions, UE functional use, Sensation, Body mechanics, Pain, Strength, IADL   Visit Diagnosis: Acute pain of left shoulder    Problem List Patient Active Problem List   Diagnosis Date Noted   Knee pain, right 09/16/2020   Encounter for insertion of copper  intrauterine contraceptive device (IUD) 08/16/2020   Hyperglycemia    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 ([place])    Blister of breast with infection, left, initial encounter 07/09/2020   Morbid obesity ([place]) 05/11/2020   Chronic back pain 10/31/2018   Right sciatic nerve pain 10/31/2018   Routine cervical smear 04/23/2018   Epidermoid cyst 08/04/2016   Preventative health care 02/20/2016   Contraceptive management 05/25/2015   Type 2 diabetes mellitus ([place]) 04/24/2015   Microalbuminuria due to type 2 diabetes mellitus ([place]) 03/25/2015   Hyperlipemia 03/09/2015   Tobacco use disorder 03/09/2015   Essential hypertension 11/15/2012     Kathrine Cords, OTR/L, MSOT  08/04/2021, 6:31 PM  [place]. [place], [place], [address] Phone: [phone]   Fax:  [phone]  Name: Darlene Bennett MRN: [id number] Date of Birth: 1976-05-18

## 2021-08-04 NOTE — Patient Instructions (Signed)
Access Code: [email or web address] URL: [email or web address] Date: 08/04/2021 Prepared by: Grayling Congress  Exercises Seated Scapular Retraction - 2 x daily - 7 x weekly - 2 sets - 10 reps - 3 sec hold Seated Cervical Retraction - 2 x daily - 7 x weekly - 2 sets - 10 reps - 3 sec hold Seated Upper Trapezius Stretch - 2 x daily - 7 x weekly - 2 sets - 30 sec hold Gentle Levator Scapulae Stretch - 2 x daily - 7 x weekly - 2 sets - 30 sec hold

## 2021-08-04 NOTE — Therapy (Signed)
[place]. [place], [place], [address] Phone: [phone]   Fax:  [phone]  Physical Therapy Evaluation  Patient Details  Name: Darlene Bennett MRN: [id number] Date of Birth: [birth year] Referring Provider (PT): Kathrynn Ducking, MD   Encounter Date: 08/04/2021   PT End of Session - 08/04/21 1448     Visit Number 1    Number of Visits 13    Date for PT Re-Evaluation 09/15/21    Authorization Type Traditional Medicaid    PT Start Time 1407    PT Stop Time [id number]    PT Time Calculation (min) 36 min    Activity Tolerance Patient tolerated treatment well;Patient limited by pain    Behavior During Therapy [facility] for tasks assessed/performed             Past Medical History:  Diagnosis Date   Gestational diabetes    insulin   Gestational diabetes mellitus, antepartum 02/14/2013   History of gestational diabetes    Hypertension    preeclampsia with previous pregnancy   Scalp cyst    Slurred speech    Stroke ([place])    8/21, speech difficulties    Past Surgical History:  Procedure Laterality Date   CYST EXCISION N/A 09/15/2016   Procedure: EXCISION POSTERIOR SCALP CYST;  Surgeon: Clovis Riley, MD;  Location: [facility];  Service: General;  Laterality: N/A;   TUBAL LIGATION     2007    There were no vitals filed for this visit.    Subjective Assessment - 08/04/21 1408     Subjective Patient reports L shoulder pain for about a month without inciting injury. Was told that it's arthritis. Pain is located over the L anterior and lateral shoulder with radiation down the L arm to the thumb and index finger. Notes N/T in these 2 fingers. Denies neck pain or pain with head movement. Worse when laying on the L or R side, reaching overhead or behind the back. Notes some difficulty gripping.    Pertinent History HTN, stroke 07/2020    Limitations Lifting;House hold activities    Diagnostic tests 07/31/21 L  shoulder xray: negative    Patient Stated Goals decrease pain    Currently in Pain? Yes    Pain Score 9     Pain Location Shoulder    Pain Orientation Left;Anterior;Lateral    Pain Descriptors / Indicators Shooting    Pain Type Acute pain    Pain Radiating Towards down L arm to digits 1 & 2                [facility] PT Assessment - 08/04/21 1412       Assessment   Medical Diagnosis Acute pain of left shoulder    Referring Provider (PT) Kathrynn Ducking, MD    Onset Date/Surgical Date 07/04/21    Hand Dominance Left    Next MD Visit not scheduled    Prior Therapy no      Precautions   Precautions None      Balance Screen   Has the patient fallen in the past 6 months No    Has the patient had a decrease in activity level because of a fear of falling?  No    Is the patient reluctant to leave their home because of a fear of falling?  No      Home Social worker Private residence    Living Arrangements Spouse/significant other;Children  Available Help at Discharge Family    Type of Home Apartment      Prior Function   Level of Independence Independent    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Vocation Requirements cooking, cleaning, standing    Leisure none      Cognition   Overall Cognitive Status Within Functional Limits for tasks assessed      Sensation   Light Touch Appears Intact      Coordination   Gross Motor Movements are Fluid and Coordinated Yes      Posture/Postural Control   Posture/Postural Control Postural limitations    Postural Limitations Rounded Shoulders;Forward head    Posture Comments L shoulder elevated; neck sitting in flexion      ROM / Strength   AROM / PROM / Strength AROM;Strength      AROM   AROM Assessment Site Shoulder;Cervical    Right/Left Shoulder Right;Left    Right Shoulder Flexion 146 Degrees    Right Shoulder ABduction 170 Degrees    Right Shoulder Internal Rotation --   FIR T9   Right Shoulder External  Rotation --   FER T3   Left Shoulder Flexion 120 Degrees   pain   Left Shoulder ABduction 159 Degrees   pain   Left Shoulder Internal Rotation --   FIR T10; pain   Left Shoulder External Rotation --   FER lateral to C7; pain   Cervical Flexion 34    Cervical Extension 30   pain in L anterolateral shoulder   Cervical - Right Side Bend 35    Cervical - Left Side Bend 24   L arm pain   Cervical - Right Rotation 40    Cervical - Left Rotation 30   pain in L arm     Strength   Strength Assessment Site Shoulder;Hand    Right/Left Shoulder Right;Left    Right Shoulder Flexion 4+/5    Right Shoulder ABduction 4+/5    Right Shoulder Internal Rotation 4+/5    Right Shoulder External Rotation 4+/5    Left Shoulder Flexion 4/5    Left Shoulder ABduction 4/5    Left Shoulder Internal Rotation 4-/5    Left Shoulder External Rotation 4-/5    Right/Left hand Right;Left    Right Hand Grip (lbs) 75.3   88, 70, 68   Left Hand Grip (lbs) 81   73, 85, 85     Palpation   Palpation comment TTP over L scalenes, pec, anterior and lateral deltoid, and proximal biceps; no TTP over central cervical midline; very tight throughout cervical musculature L>R      Special Tests    Special Tests Cervical    Cervical Tests Spurling's;Dictraction      Spurling's   Comment negative on R; unable to tolerate d/t pain on L      Distraction Test   Findngs Negative    Comment report of no change                        Objective measurements completed on examination: See above findings.               PT Education - 08/04/21 1448     Education Details prognosis, POC, HEP; edu on cervical radiculopathy    Person(s) Educated Patient    Methods Explanation;Demonstration;Tactile cues;Verbal cues;Handout    Comprehension Verbalized understanding;Returned demonstration              PT Short  Term Goals - 08/04/21 1800       PT SHORT TERM GOAL #1   Title Patient to be independent  with initial HEP.    Time 3    Period Weeks    Status New    Target Date 08/25/21               PT Long Term Goals - 08/04/21 1800       PT LONG TERM GOAL #1   Title Patient to be independent with advanced HEP.    Time 6    Period Weeks    Status New    Target Date 09/15/21      PT LONG TERM GOAL #2   Title Patient to demonstrate L shoulder AROM WFL and without pain limiting.    Time 6    Period Weeks    Status New    Target Date 09/15/21      PT LONG TERM GOAL #3   Title Patient to demonstrate cervical AROM WFL and without pain limiting.    Time 6    Period Weeks    Status New    Target Date 09/15/21      PT LONG TERM GOAL #4   Title Patient to demonstrate L shoulder strength >/=4+/5.    Time 6    Period Weeks    Status New    Target Date 09/15/21      PT LONG TERM GOAL #5   Title Patient to report 70% improvement in pain.    Time 6    Period Weeks    Status New    Target Date 09/15/21                    Plan - 08/04/21 1453     Clinical Impression Statement Patient is a 45 y/o F presenting to OPPT with c/o acute L shoulder pain for the past 1 month. Pertinent PMH includes R frontal stroke in August 2021. Pain is located over the L anterolateral shoulder with radiation down the L arm to digits 1 & 2 and with N/T in the same digits. Worse with R/L sidelying and reaching overhead/behind the back. Patient today presenting with flexed and rounded posture, limited and painful L shoulder and cervical AROM. Decreased L shoulder strength, TTP over L scalenes, pec, anterior and lateral deltoid, and proximal biceps, considerable tightness throughout cervical musculature L>R. Feel that patient's cardinal pain was reproduced with cervical AROM, particularly extension and L rotation/sidebending. Educated patient on s/sx of cervical radiculopathy as patient's symptoms seem in line with this rather than true shoulder pathology. Patient was educated on gentle  stretching and postural correction HEP and reported understanding. Would benefit from skilled PT services 2x/week for 6 weeks to address aforementioned impairments.    Personal Factors and Comorbidities Age;Fitness;Comorbidity 2;Time since onset of injury/illness/exacerbation;Profession;Past/Current Experience    Comorbidities HTN, stroke 07/2020    Examination-Activity Limitations Reach Overhead;Bed Mobility;Sleep;Caring for Others;Carry;Dressing;Hygiene/Grooming;Lift    Examination-Participation Restrictions Laundry;Shop;Driving;Community Activity;Cleaning;Occupation;Meal Prep    Stability/Clinical Decision Making Evolving/Moderate complexity    Clinical Decision Making Moderate    Rehab Potential Good    PT Frequency 2x / week    PT Duration 6 weeks    PT Treatment/Interventions ADLs/Self Care Home Management;Cryotherapy;Electrical Stimulation;Ultrasound;Traction;Moist Heat;Iontophoresis '4mg'$ /ml Dexamethasone;Functional mobility training;Therapeutic activities;Therapeutic exercise;Neuromuscular re-education;Manual techniques;Patient/family education;Passive range of motion;Dry needling;Energy conservation;Vasopneumatic Device;Taping    PT Next Visit Plan reassess HEP; progress postural strengthening; STM or modalities for pain    Consulted and  Agree with Plan of Care Patient             Patient will benefit from skilled therapeutic intervention in order to improve the following deficits and impairments:  Decreased range of motion, Impaired UE functional use, Increased muscle spasms, Decreased activity tolerance, Pain, Improper body mechanics, Impaired flexibility, Postural dysfunction, Decreased strength  Visit Diagnosis: Radiculopathy, cervical region  Acute pain of left shoulder  Abnormal posture  Muscle weakness (generalized)     Problem List Patient Active Problem List   Diagnosis Date Noted   Knee pain, right 09/16/2020   Encounter for insertion of copper intrauterine  contraceptive device (IUD) 08/16/2020   Hyperglycemia    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 ([place])    Blister of breast with infection, left, initial encounter 07/09/2020   Morbid obesity ([place]) 05/11/2020   Chronic back pain 10/31/2018   Right sciatic nerve pain 10/31/2018   Routine cervical smear 04/23/2018   Epidermoid cyst 08/04/2016   Preventative health care 02/20/2016   Contraceptive management 05/25/2015   Type 2 diabetes mellitus ([place]) 04/24/2015   Microalbuminuria due to type 2 diabetes mellitus ([place]) 03/25/2015   Hyperlipemia 03/09/2015   Tobacco use disorder 03/09/2015   Essential hypertension 11/15/2012    Janene Harvey, PT, DPT 08/04/21 6:03 PM   [place]. [place], [place], [address] Phone: [phone]   Fax:  [phone]  Name: Darlene Bennett MRN: [id number] Date of Birth: Apr 04, 1976

## 2021-08-11 ENCOUNTER — Encounter: Payer: Self-pay | Admitting: Physical Therapy

## 2021-08-11 ENCOUNTER — Other Ambulatory Visit: Payer: Self-pay

## 2021-08-11 ENCOUNTER — Ambulatory Visit: Payer: Medicaid Other | Attending: Neurology | Admitting: Physical Therapy

## 2021-08-11 DIAGNOSIS — M6281 Muscle weakness (generalized): Secondary | ICD-10-CM

## 2021-08-11 DIAGNOSIS — M25512 Pain in left shoulder: Secondary | ICD-10-CM | POA: Diagnosis not present

## 2021-08-11 DIAGNOSIS — R293 Abnormal posture: Secondary | ICD-10-CM

## 2021-08-11 DIAGNOSIS — M5412 Radiculopathy, cervical region: Secondary | ICD-10-CM

## 2021-08-11 NOTE — Therapy (Signed)
[facility]. [place], [place], [address] Phone: [phone]   Fax:  [phone]  Physical Therapy Treatment  Patient Details  Name: Darlene Bennett MRN: [id number] Date of Birth: Jan 30, 1976 Referring Provider (PT): Kathrynn Ducking, MD   Encounter Date: 08/11/2021   PT End of Session - 08/11/21 1615     Visit Number 2    Number of Visits 13    Date for PT Re-Evaluation 09/15/21    Authorization Type Traditional Medicaid    Authorization Time Period 3 visits from 09/07-09/20    Authorization - Visit Number 1    Authorization - Number of Visits 3    PT Start Time 1446    PT Stop Time 1540    PT Time Calculation (min) 54 min    Activity Tolerance Patient tolerated treatment well;Patient limited by pain    Behavior During Therapy [facility] for tasks assessed/performed             Past Medical History:  Diagnosis Date   Gestational diabetes    insulin   Gestational diabetes mellitus, antepartum 02/14/2013   History of gestational diabetes    Hypertension    preeclampsia with previous pregnancy   Scalp cyst    Slurred speech    Stroke ([place])    8/21, speech difficulties    Past Surgical History:  Procedure Laterality Date   CYST EXCISION N/A 09/15/2016   Procedure: EXCISION POSTERIOR SCALP CYST;  Surgeon: Clovis Riley, MD;  Location: [facility];  Service: General;  Laterality: N/A;   TUBAL LIGATION     2007    There were no vitals filed for this visit.   Subjective Assessment - 08/11/21 1445     Subjective The arm has been hurting her. Has been doing her HEP and denies questions.    Pertinent History HTN, stroke 07/2020    Diagnostic tests 07/31/21 L shoulder xray: negative    Patient Stated Goals decrease pain    Currently in Pain? Yes    Pain Score 9     Pain Location Shoulder    Pain Orientation Left;Anterior;Posterior    Pain Descriptors / Indicators Aching    Pain Type Acute pain                                [facility] Adult PT Treatment/Exercise - 08/11/21 0001       Exercises   Exercises Neck;Shoulder      Neck Exercises: Machines for Strengthening   UBE (Upper Arm Bike) L1.0 x 3 min fwd/back      Neck Exercises: Seated   Neck Retraction 10 reps;3 secs    Neck Retraction Limitations limited tolerance    Other Seated Exercise scap retraction 10x3"   cues to depress shoulders     Neck Exercises: Supine   Neck Retraction 10 reps;3 secs    Neck Retraction Limitations with towel roll   better tolerance than in sitting     Neck Exercises: Stretches   Upper Trapezius Stretch Left;1 rep;30 seconds;Right    Upper Trapezius Stretch Limitations with strap assist   cues to avoid pushing into painful ROM   Levator Stretch Right;Left;1 rep;30 seconds    Levator Stretch Limitations with strap assist   cues to avoid pushing into painful ROM     Shoulder Exercises: Stretch   Other Shoulder Stretches L pec stretch 90 deg 2x30"  Modalities   Modalities Electrical Stimulation;Moist Heat      Moist Heat Therapy   Number Minutes Moist Heat 10 Minutes    Moist Heat Location Shoulder   L     Electrical Stimulation   Electrical Stimulation Location R shoulder complex    Electrical Stimulation Action IFC    Electrical Stimulation Parameters output to tolerance; 10 min; sitting    Electrical Stimulation Goals Pain;Tone      Manual Therapy   Manual Therapy Manual Traction;Soft tissue mobilization;Myofascial release    Manual therapy comments supine    Soft tissue mobilization STM to L UT, LS, scalenes, proximal biceps tendon pec,, anterior and lateral delt    Myofascial Release mantual TPR to above muscles   very tight and TTP in anterior and lateral shoulder musculature; good improvement in UT tone affter MT   Manual Traction cervical with towel assist 3x30"                  Upper Extremity Functional Index Score :   /80     PT Short Term  Goals - 08/11/21 1618       PT SHORT TERM GOAL #1   Title Patient to be independent with initial HEP.    Time 3    Period Weeks    Status On-going    Target Date 08/25/21               PT Long Term Goals - 08/11/21 1618       PT LONG TERM GOAL #1   Title Patient to be independent with advanced HEP.    Time 6    Period Weeks    Status On-going      PT LONG TERM GOAL #2   Title Patient to demonstrate L shoulder AROM WFL and without pain limiting.    Time 6    Period Weeks    Status On-going      PT LONG TERM GOAL #3   Title Patient to demonstrate cervical AROM WFL and without pain limiting.    Time 6    Period Weeks    Status On-going      PT LONG TERM GOAL #4   Title Patient to demonstrate L shoulder strength >/=4+/5.    Time 6    Period Weeks    Status On-going      PT LONG TERM GOAL #5   Title Patient to report 70% improvement in pain.    Time 6    Period Weeks    Status On-going                   Plan - 08/11/21 1616     Clinical Impression Statement Patient arrived to session with report of continued L shoulder pain. Reports compliance with HEP and denies questions. Reviewed gentle cervical and scapular mobility exercises with intermittent cueing provided to correct form. Cervical retractions brought on L shoulder pain, but with improved tolerance/ability in supine today. Trialed cervical traction with good tolerance, but without change in symptoms. Proceeded with STM and manual TPR with increased tone and soft tissue restriction evident in cervical and shoulder musculature. Ended session with moist heat and e-stim to L shoulder for pain relief. Patient without complaints at end of session.    Comorbidities HTN, stroke 07/2020    PT Treatment/Interventions ADLs/Self Care Home Management;Cryotherapy;Electrical Stimulation;Ultrasound;Traction;Moist Heat;Iontophoresis '4mg'$ /ml Dexamethasone;Functional mobility training;Therapeutic activities;Therapeutic  exercise;Neuromuscular re-education;Manual techniques;Patient/family education;Passive range of motion;Dry needling;Energy conservation;Vasopneumatic Device;Taping  PT Next Visit Plan reassess HEP; progress postural strengthening; STM or modalities for pain    Consulted and Agree with Plan of Care Patient             Patient will benefit from skilled therapeutic intervention in order to improve the following deficits and impairments:  Decreased range of motion, Impaired UE functional use, Increased muscle spasms, Decreased activity tolerance, Pain, Improper body mechanics, Impaired flexibility, Postural dysfunction, Decreased strength  Visit Diagnosis: Acute pain of left shoulder  Radiculopathy, cervical region  Abnormal posture  Muscle weakness (generalized)     Problem List Patient Active Problem List   Diagnosis Date Noted   Knee pain, right 09/16/2020   Encounter for insertion of copper intrauterine contraceptive device (IUD) 08/16/2020   Hyperglycemia    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 ([place])    Blister of breast with infection, left, initial encounter 07/09/2020   Morbid obesity ([place]) 05/11/2020   Chronic back pain 10/31/2018   Right sciatic nerve pain 10/31/2018   Routine cervical smear 04/23/2018   Epidermoid cyst 08/04/2016   Preventative health care 02/20/2016   Contraceptive management 05/25/2015   Type 2 diabetes mellitus ([place]) 04/24/2015   Microalbuminuria due to type 2 diabetes mellitus ([place]) 03/25/2015   Hyperlipemia 03/09/2015   Tobacco use disorder 03/09/2015   Essential hypertension 11/15/2012     Janene Harvey, PT, DPT 08/11/21 4:27 PM    [facility]. [place], [place], [address] Phone: [phone]   Fax:  [phone]  Name: Darlene Bennett MRN: [id number] Date of Birth: Feb 24, 1976

## 2021-08-12 ENCOUNTER — Ambulatory Visit: Payer: Medicaid Other

## 2021-08-12 DIAGNOSIS — M25512 Pain in left shoulder: Secondary | ICD-10-CM

## 2021-08-12 DIAGNOSIS — M6281 Muscle weakness (generalized): Secondary | ICD-10-CM

## 2021-08-12 DIAGNOSIS — R293 Abnormal posture: Secondary | ICD-10-CM

## 2021-08-12 DIAGNOSIS — M5412 Radiculopathy, cervical region: Secondary | ICD-10-CM

## 2021-08-12 NOTE — Patient Instructions (Signed)
Access Code: [email or web address] URL: [email or web address] Date: 08/12/2021 Prepared by: Kathreen Cornfield  Exercises Seated Scapular Retraction - 2 x daily - 7 x weekly - 2 sets - 10 reps - 3 sec hold Seated Cervical Retraction - 2 x daily - 7 x weekly - 2 sets - 10 reps - 3 sec hold Seated Upper Trapezius Stretch - 2 x daily - 7 x weekly - 2 sets - 30 sec hold Gentle Levator Scapulae Stretch - 2 x daily - 7 x weekly - 2 sets - 30 sec hold Standing Radial Nerve Glide - 1 x daily - 7 x weekly - 3 sets - 10 reps Prone Cervical Retraction - 1 x daily - 7 x weekly - 1-2 sets - 10 reps - 3-5 seconds hold

## 2021-08-12 NOTE — Therapy (Signed)
[facility]. [place], [place], [address] Phone: [phone]   Fax:  [phone]  Physical Therapy Treatment  Patient Details  Name: Darlene Bennett MRN: [id number] Date of Birth: 1976/08/31 Referring Provider (PT): Kathrynn Ducking, MD   Encounter Date: 08/12/2021   PT End of Session - 08/12/21 1545     Visit Number 3    Number of Visits 13    Date for PT Re-Evaluation 09/15/21    Authorization Type Traditional Medicaid    Authorization Time Period 3 visits from 09/07-09/20    Authorization - Visit Number 1    Authorization - Number of Visits 3    PT Start Time 1539   pt arrived late   PT Stop Time 1615    PT Time Calculation (min) 36 min    Activity Tolerance Patient tolerated treatment well;Patient limited by pain    Behavior During Therapy [facility] for tasks assessed/performed             Past Medical History:  Diagnosis Date   Gestational diabetes    insulin   Gestational diabetes mellitus, antepartum 02/14/2013   History of gestational diabetes    Hypertension    preeclampsia with previous pregnancy   Scalp cyst    Slurred speech    Stroke ([place])    8/21, speech difficulties    Past Surgical History:  Procedure Laterality Date   CYST EXCISION N/A 09/15/2016   Procedure: EXCISION POSTERIOR SCALP CYST;  Surgeon: Clovis Riley, MD;  Location: [facility];  Service: General;  Laterality: N/A;   TUBAL LIGATION     2007    There were no vitals filed for this visit.   Subjective Assessment - 08/12/21 1543     Subjective Pt reports the estim helped a little. She still has pain on the top of the shoulder going down some.    Pertinent History HTN, stroke 07/2020    Diagnostic tests 07/31/21 L shoulder xray: negative    Patient Stated Goals decrease pain    Currently in Pain? Yes    Pain Score 7     Pain Location Shoulder    Pain Orientation Left;Anterior;Lateral    Pain Descriptors / Indicators  Aching    Pain Type Acute pain                               [facility] Adult PT Treatment/Exercise - 08/12/21 0001       Self-Care   Self-Care Posture;Other Self-Care Comments    Posture shoulders down/back and chin tuck for improved cervical positioning to decrease symptoms    Other Self-Care Comments  see pt edu      Exercises   Exercises Neck;Shoulder      Neck Exercises: Machines for Strengthening   UBE (Upper Arm Bike) L1.0 x 3 min fwd/back   attempted backward x1.5 min but too painful, reversed to fwd again     Neck Exercises: Prone   Axial Exension 15 reps    Axial Extension Limitations + cervical retraction x3-5" holds      Shoulder Exercises: Prone   Retraction Strengthening;Both;10 reps   tactile and verbal cueing, 3 seconds   Retraction Limitations + extension with neck relaxed   verbal cues to reach through fingertips to encourage shoulder depression (L more elevated than R)     Shoulder Exercises: Sidelying   Other Sidelying Exercises L thoracic  rotation with straight arm and HBH x6 ea      Manual Therapy   Manual Therapy Soft tissue mobilization;Neural Stretch;Passive ROM;Joint mobilization    Manual therapy comments supine    Joint Mobilization post/inf GHJ glides and sustained positioning for neural tension to radial nerve    Soft tissue mobilization assessed L cervical and shoulder anterior muscle tightness --> subsclavius and pecs restricted most    Passive ROM L shoulder ER and flexion    Neural Stretch L radial nerve glides adding R cervical lateral flexion                     PT Education - 08/12/21 1643     Education Details Posture in standing, added prone cervical retraction and radial nerve glides to HEP and provided handout    Person(s) Educated Patient    Methods Explanation;Demonstration;Tactile cues;Verbal cues;Handout    Comprehension Verbalized understanding;Returned demonstration;Verbal cues required;Tactile cues  required              PT Short Term Goals - 08/11/21 1618       PT SHORT TERM GOAL #1   Title Patient to be independent with initial HEP.    Time 3    Period Weeks    Status On-going    Target Date 08/25/21               PT Long Term Goals - 08/11/21 1618       PT LONG TERM GOAL #1   Title Patient to be independent with advanced HEP.    Time 6    Period Weeks    Status On-going      PT LONG TERM GOAL #2   Title Patient to demonstrate L shoulder AROM WFL and without pain limiting.    Time 6    Period Weeks    Status On-going      PT LONG TERM GOAL #3   Title Patient to demonstrate cervical AROM WFL and without pain limiting.    Time 6    Period Weeks    Status On-going      PT LONG TERM GOAL #4   Title Patient to demonstrate L shoulder strength >/=4+/5.    Time 6    Period Weeks    Status On-going      PT LONG TERM GOAL #5   Title Patient to report 70% improvement in pain.    Time 6    Period Weeks    Status On-going                   Plan - 08/12/21 1547     Clinical Impression Statement Pt arrived late to session. She has decreased pain from 9/10 yesterday to 7/10 today. She tolerated tx well focused on shoulder and neck positioning, passive radial nerve glides, and postural strengthening. She responded well to prone cervical retraction with slight thoracic extension. Provided addition to HEP to include radial nerve glides in standing and prone cervical retraction.    Comorbidities HTN, stroke 07/2020    PT Treatment/Interventions ADLs/Self Care Home Management;Cryotherapy;Electrical Stimulation;Ultrasound;Traction;Moist Heat;Iontophoresis '4mg'$ /ml Dexamethasone;Functional mobility training;Therapeutic activities;Therapeutic exercise;Neuromuscular re-education;Manual techniques;Patient/family education;Passive range of motion;Dry needling;Energy conservation;Vasopneumatic Device;Taping    PT Next Visit Plan reassess HEP; progress postural  strengthening; STM or modalities for pain    Consulted and Agree with Plan of Care Patient             Patient will benefit from skilled therapeutic intervention in  order to improve the following deficits and impairments:  Decreased range of motion, Impaired UE functional use, Increased muscle spasms, Decreased activity tolerance, Pain, Improper body mechanics, Impaired flexibility, Postural dysfunction, Decreased strength  Visit Diagnosis: Acute pain of left shoulder  Radiculopathy, cervical region  Abnormal posture  Muscle weakness (generalized)     Problem List Patient Active Problem List   Diagnosis Date Noted   Knee pain, right 09/16/2020   Encounter for insertion of copper intrauterine contraceptive device (IUD) 08/16/2020   Hyperglycemia    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 ([place])    Blister of breast with infection, left, initial encounter 07/09/2020   Morbid obesity ([place]) 05/11/2020   Chronic back pain 10/31/2018   Right sciatic nerve pain 10/31/2018   Routine cervical smear 04/23/2018   Epidermoid cyst 08/04/2016   Preventative health care 02/20/2016   Contraceptive management 05/25/2015   Type 2 diabetes mellitus ([place]) 04/24/2015   Microalbuminuria due to type 2 diabetes mellitus ([place]) 03/25/2015   Hyperlipemia 03/09/2015   Tobacco use disorder 03/09/2015   Essential hypertension 11/15/2012    Izell Kingsland, PT, DPT 08/12/2021, 4:46 PM  [facility]. [place], [place], [address] Phone: [phone]   Fax:  [phone]  Name: Adileni Brooker MRN: [id number] Date of Birth: [birth year]

## 2021-08-16 ENCOUNTER — Ambulatory Visit: Payer: Medicaid Other | Admitting: Physical Therapy

## 2021-08-19 ENCOUNTER — Ambulatory Visit: Payer: Medicaid Other

## 2021-08-23 ENCOUNTER — Ambulatory Visit: Payer: Medicaid Other | Admitting: Physical Therapy

## 2021-08-26 ENCOUNTER — Ambulatory Visit: Payer: Medicaid Other

## 2021-08-30 ENCOUNTER — Ambulatory Visit: Payer: Medicaid Other | Admitting: Physical Therapy

## 2021-09-02 ENCOUNTER — Ambulatory Visit: Payer: Medicaid Other | Admitting: Physical Therapy

## 2021-09-06 ENCOUNTER — Other Ambulatory Visit: Payer: Self-pay

## 2021-09-06 ENCOUNTER — Encounter: Payer: Self-pay | Admitting: Physical Therapy

## 2021-09-06 ENCOUNTER — Ambulatory Visit: Payer: Medicaid Other | Attending: Neurology | Admitting: Physical Therapy

## 2021-09-06 DIAGNOSIS — R293 Abnormal posture: Secondary | ICD-10-CM | POA: Insufficient documentation

## 2021-09-06 DIAGNOSIS — M5412 Radiculopathy, cervical region: Secondary | ICD-10-CM | POA: Diagnosis present

## 2021-09-06 DIAGNOSIS — M6281 Muscle weakness (generalized): Secondary | ICD-10-CM | POA: Insufficient documentation

## 2021-09-06 DIAGNOSIS — M25512 Pain in left shoulder: Secondary | ICD-10-CM | POA: Diagnosis not present

## 2021-09-06 NOTE — Patient Instructions (Signed)
Access Code: [email or web address] URL: [email or web address] Date: 09/06/2021 Prepared by: Ethel Rana  Exercises Seated Gentle Upper Trapezius Stretch - 1 x daily - 7 x weekly - 1 sets - 3 reps - 10-15 hold Gentle Levator Scapulae Stretch - 1 x daily - 7 x weekly - 3 sets - 3 reps - 10-15 hold Standing Shoulder Flexion to 90 Degrees - 1 x daily - 7 x weekly - 3 sets - 10 reps Shoulder Extension with Resistance - 1 x daily - 7 x weekly - 1 sets - 10 reps Standing Row with Anchored Resistance - 1 x daily - 7 x weekly - 1 sets - 10 reps Seated Upright Posture Correction - 1 x daily - 7 x weekly - 3 sets - 10 reps Shoulder Abduction - Thumbs Up - 1 x daily - 7 x weekly - 1 sets - 10 reps

## 2021-09-06 NOTE — Therapy (Signed)
[facility]. [place], [place], [address] Phone: [phone]   Fax:  [phone]  Physical Therapy Treatment  Patient Details  Name: Darlene Bennett MRN: [id number] Date of Birth: Jan 02, 1976 Referring Provider (PT): Kathrynn Ducking, MD   Encounter Date: 09/06/2021   PT End of Session - 09/06/21 1854     Visit Number 4    Number of Visits 13    Date for PT Re-Evaluation 09/15/21    Authorization Type Traditional Medicaid    PT Start Time 3300    PT Stop Time 1846    PT Time Calculation (min) 48 min    Activity Tolerance Patient tolerated treatment well    Behavior During Therapy [facility] for tasks assessed/performed             Past Medical History:  Diagnosis Date   Gestational diabetes    insulin   Gestational diabetes mellitus, antepartum 02/14/2013   History of gestational diabetes    Hypertension    preeclampsia with previous pregnancy   Scalp cyst    Slurred speech    Stroke ([place])    8/21, speech difficulties    Past Surgical History:  Procedure Laterality Date   CYST EXCISION N/A 09/15/2016   Procedure: EXCISION POSTERIOR SCALP CYST;  Surgeon: Clovis Riley, MD;  Location: [facility];  Service: General;  Laterality: N/A;   TUBAL LIGATION     2007    There were no vitals filed for this visit.   Subjective Assessment - 09/06/21 1801     Subjective Continues to feel pain, it has moved down into her elbow with neck and shoulder improved. Able to reach abover her head.    Pertinent History HTN, stroke 07/2020    Limitations Lifting;House hold activities    Diagnostic tests 07/31/21 L shoulder xray: negative    Patient Stated Goals decrease pain    Currently in Pain? Yes    Pain Score 5     Pain Location Elbow    Pain Orientation Left    Pain Descriptors / Indicators Sharp;Aching    Pain Type Acute pain;Chronic pain    Pain Onset More than a month ago    Aggravating Factors  Lying on L  side    Pain Relieving Factors heat    Effect of Pain on Daily Activities Reaching, bathing, dressing                               [facility] Adult PT Treatment/Exercise - 09/06/21 0001       Posture/Postural Control   Posture Comments Patient performed seated postural exercise, placing B hands in ER behind hips, pushing through hands and lifting chest to stretch ant chest, hold x 10 seconds, repeat x 5      Exercises   Exercises Neck;Shoulder      Neck Exercises: Machines for Strengthening   UBE (Upper Arm Bike) L1.0 x 3 min fwd/back      Neck Exercises: Theraband   Shoulder Extension 10 reps;Red    Rows 10 reps;Red    Other Theraband Exercises shouder flexion, shoulder abd B 10 reps each to 90 degrees. VC and TC to keep shoulders down.      Neck Exercises: Stretches   Upper Trapezius Stretch 3 reps;30 seconds;Left    Levator Stretch 3 reps;30 seconds;Left      Manual Therapy   Manual Therapy Soft tissue  mobilization;Passive ROM                     PT Education - 09/06/21 1853     Education Details Posture, HEP, educated to the need to perform stretching and strenghtning to ensure lasting results.    Person(s) Educated Patient    Methods Explanation;Demonstration;Tactile cues;Verbal cues;Handout    Comprehension Verbalized understanding;Returned demonstration;Tactile cues required;Verbal cues required              PT Short Term Goals - 09/06/21 1859       PT SHORT TERM GOAL #1   Title Patient to be independent with initial HEP.    Status Partially Met               PT Long Term Goals - 08/11/21 1618       PT LONG TERM GOAL #1   Title Patient to be independent with advanced HEP.    Time 6    Period Weeks    Status On-going      PT LONG TERM GOAL #2   Title Patient to demonstrate L shoulder AROM WFL and without pain limiting.    Time 6    Period Weeks    Status On-going      PT LONG TERM GOAL #3   Title Patient to  demonstrate cervical AROM WFL and without pain limiting.    Time 6    Period Weeks    Status On-going      PT LONG TERM GOAL #4   Title Patient to demonstrate L shoulder strength >/=4+/5.    Time 6    Period Weeks    Status On-going      PT LONG TERM GOAL #5   Title Patient to report 70% improvement in pain.    Time 6    Period Weeks    Status On-going                   Plan - 09/06/21 1856     Clinical Impression Statement Patient reports imporved pain in shouder, remains in lower arm, numbness improved in fingers, but still present. Therpasit performed soft tissue mobilizaiton and stretch followed by education for self stretch, posutral activities, strengthening. She fully participated.    Personal Factors and Comorbidities Fitness;Time since onset of injury/illness/exacerbation;Past/Current Experience    Comorbidities HTN, stroke 07/2020    Examination-Activity Limitations Bed Mobility;Caring for Others;Sleep;Dressing;Hygiene/Grooming;Bathing;Reach Overhead    Examination-Participation Restrictions Meal Prep;Cleaning;Laundry    PT Next Visit Plan reassess HEP; progress postural strengthening; STM or modalities for pain    Consulted and Agree with Plan of Care Patient             Patient will benefit from skilled therapeutic intervention in order to improve the following deficits and impairments:  Decreased range of motion, Impaired UE functional use, Increased muscle spasms, Decreased activity tolerance, Pain, Improper body mechanics, Impaired flexibility, Postural dysfunction, Decreased strength  Visit Diagnosis: Acute pain of left shoulder  Radiculopathy, cervical region  Abnormal posture  Muscle weakness (generalized)     Problem List Patient Active Problem List   Diagnosis Date Noted   Knee pain, right 09/16/2020   Encounter for insertion of copper intrauterine contraceptive device (IUD) 08/16/2020   Hyperglycemia    Cerebrovascular accident  (CVA) due to occlusion of right middle cerebral artery ([place])    Blister of breast with infection, left, initial encounter 07/09/2020   Morbid obesity ([place]) 05/11/2020   Chronic back  pain 10/31/2018   Right sciatic nerve pain 10/31/2018   Routine cervical smear 04/23/2018   Epidermoid cyst 08/04/2016   Preventative health care 02/20/2016   Contraceptive management 05/25/2015   Type 2 diabetes mellitus ([place]) 04/24/2015   Microalbuminuria due to type 2 diabetes mellitus ([place]) 03/25/2015   Hyperlipemia 03/09/2015   Tobacco use disorder 03/09/2015   Essential hypertension 11/15/2012    Marcelina Morel, PT 09/06/2021, 7:02 PM  [facility]. [place], [place], [address] Phone: [phone]   Fax:  [phone]  Name: Nazyia Gaugh MRN: [id number] Date of Birth: 06/24/1976

## 2021-09-09 ENCOUNTER — Ambulatory Visit: Payer: Medicaid Other | Admitting: Physical Therapy

## 2021-09-21 ENCOUNTER — Emergency Department (HOSPITAL_COMMUNITY): Payer: Medicaid Other

## 2021-09-21 ENCOUNTER — Encounter (HOSPITAL_COMMUNITY): Payer: Self-pay

## 2021-09-21 ENCOUNTER — Other Ambulatory Visit: Payer: Self-pay

## 2021-09-21 ENCOUNTER — Emergency Department (HOSPITAL_COMMUNITY)
Admission: EM | Admit: 2021-09-21 | Discharge: 2021-09-21 | Disposition: A | Discharge status: Home / Self Care | Payer: Medicaid Other | Attending: Emergency Medicine | Admitting: Emergency Medicine

## 2021-09-21 DIAGNOSIS — Z794 Long term (current) use of insulin: Secondary | ICD-10-CM | POA: Diagnosis not present

## 2021-09-21 DIAGNOSIS — Z7982 Long term (current) use of aspirin: Secondary | ICD-10-CM | POA: Insufficient documentation

## 2021-09-21 DIAGNOSIS — M25512 Pain in left shoulder: Secondary | ICD-10-CM | POA: Diagnosis not present

## 2021-09-21 DIAGNOSIS — M546 Pain in thoracic spine: Secondary | ICD-10-CM | POA: Diagnosis not present

## 2021-09-21 DIAGNOSIS — Z79899 Other long term (current) drug therapy: Secondary | ICD-10-CM | POA: Insufficient documentation

## 2021-09-21 DIAGNOSIS — M542 Cervicalgia: Secondary | ICD-10-CM | POA: Diagnosis not present

## 2021-09-21 DIAGNOSIS — Z87891 Personal history of nicotine dependence: Secondary | ICD-10-CM | POA: Diagnosis not present

## 2021-09-21 DIAGNOSIS — Y92410 Unspecified street and highway as the place of occurrence of the external cause: Secondary | ICD-10-CM | POA: Diagnosis not present

## 2021-09-21 DIAGNOSIS — I10 Essential (primary) hypertension: Secondary | ICD-10-CM | POA: Diagnosis not present

## 2021-09-21 DIAGNOSIS — M545 Low back pain, unspecified: Secondary | ICD-10-CM | POA: Diagnosis not present

## 2021-09-21 DIAGNOSIS — Z7984 Long term (current) use of oral hypoglycemic drugs: Secondary | ICD-10-CM | POA: Insufficient documentation

## 2021-09-21 DIAGNOSIS — M62830 Muscle spasm of back: Secondary | ICD-10-CM | POA: Diagnosis not present

## 2021-09-21 DIAGNOSIS — E119 Type 2 diabetes mellitus without complications: Secondary | ICD-10-CM | POA: Insufficient documentation

## 2021-09-21 MED ORDER — OXYCODONE-ACETAMINOPHEN 5-325 MG PO TABS
1.0000 | ORAL_TABLET | Freq: Once | ORAL | Status: AC
  Administered 2021-09-21: 1 via ORAL
  Filled 2021-09-21: qty 1

## 2021-09-21 MED ORDER — METHOCARBAMOL 500 MG PO TABS: 500.0000 mg | ORAL_TABLET | Freq: Two times a day (BID) | ORAL | 0 refills | Status: DC

## 2021-09-21 MED ORDER — LIDOCAINE 5 % EX PTCH
2.0000 | MEDICATED_PATCH | CUTANEOUS | Status: DC
  Administered 2021-09-21: 2 via TRANSDERMAL
  Filled 2021-09-21: qty 2

## 2021-09-21 MED ORDER — LIDOCAINE 5 % EX PTCH: 1.0000 | MEDICATED_PATCH | CUTANEOUS | 0 refills | Status: AC

## 2021-09-21 NOTE — Discharge Instructions (Signed)
You were seen in the emergency department today for your back and neck pain.  Your physical exam and vital signs are very reassuring.  The muscles in your shoulders and neck are in what is called spasm, meaning they are inappropriately tightened up.  This can be quite painful.  To help with your pain you may take Tylenol and / or NSAID medication (such as ibuprofen or naproxen) to help with your pain.  Additionally, you have been prescribed a muscle relaxer called Robaxin to help relieve some of the muscle spasm.  Please be advised that this medication may make you very sleepy, so you should not drive or operate heavy machinery while you are taking it.  You may also utilize topical pain relief such as Biofreeze, IcyHot, or topical lidocaine patches.  I also recommend that you apply heat to the area, such as a hot shower or heating pad, and follow heat application with massage of the muscles that are most tight.  Please return to the emergency department if you develop any numbness/tingling/weakness in your arms or legs, any difficulty urinating, or urinary incontinence chest pain, shortness of breath, abdominal pain, nausea or vomiting that does not stop, or any other new severe symptoms.
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
## 2021-10-14 ENCOUNTER — Emergency Department (HOSPITAL_COMMUNITY)
Admission: EM | Admit: 2021-10-14 | Discharge: 2021-10-14 | Disposition: A | Discharge status: Home / Self Care | Payer: Medicaid Other | Attending: Emergency Medicine | Admitting: Emergency Medicine

## 2021-10-14 ENCOUNTER — Emergency Department (HOSPITAL_COMMUNITY): Payer: Medicaid Other

## 2021-10-14 ENCOUNTER — Encounter (HOSPITAL_COMMUNITY): Payer: Self-pay | Admitting: Oncology

## 2021-10-14 ENCOUNTER — Other Ambulatory Visit: Payer: Self-pay

## 2021-10-14 DIAGNOSIS — Z794 Long term (current) use of insulin: Secondary | ICD-10-CM | POA: Insufficient documentation

## 2021-10-14 DIAGNOSIS — E876 Hypokalemia: Secondary | ICD-10-CM | POA: Diagnosis not present

## 2021-10-14 DIAGNOSIS — E119 Type 2 diabetes mellitus without complications: Secondary | ICD-10-CM | POA: Insufficient documentation

## 2021-10-14 DIAGNOSIS — Z79899 Other long term (current) drug therapy: Secondary | ICD-10-CM | POA: Insufficient documentation

## 2021-10-14 DIAGNOSIS — R1033 Periumbilical pain: Secondary | ICD-10-CM | POA: Insufficient documentation

## 2021-10-14 DIAGNOSIS — Z87891 Personal history of nicotine dependence: Secondary | ICD-10-CM | POA: Insufficient documentation

## 2021-10-14 DIAGNOSIS — I10 Essential (primary) hypertension: Secondary | ICD-10-CM | POA: Diagnosis not present

## 2021-10-14 DIAGNOSIS — R109 Unspecified abdominal pain: Secondary | ICD-10-CM

## 2021-10-14 DIAGNOSIS — Z7984 Long term (current) use of oral hypoglycemic drugs: Secondary | ICD-10-CM | POA: Insufficient documentation

## 2021-10-14 LAB — CBC
HCT: 41.8 % (ref 36.0–46.0)
Hemoglobin: 14.1 g/dL (ref 12.0–15.0)
MCH: 30.2 pg (ref 26.0–34.0)
MCHC: 33.7 g/dL (ref 30.0–36.0)
MCV: 89.5 fL (ref 80.0–100.0)
Platelets: 295 10*3/uL (ref 150–400)
RBC: 4.67 MIL/uL (ref 3.87–5.11)
RDW: 13 % (ref 11.5–15.5)
WBC: 9.3 10*3/uL (ref 4.0–10.5)
nRBC: 0 % (ref 0.0–0.2)

## 2021-10-14 LAB — URINALYSIS, ROUTINE W REFLEX MICROSCOPIC
Bilirubin Urine: NEGATIVE
Glucose, UA: NEGATIVE mg/dL
Hgb urine dipstick: NEGATIVE
Ketones, ur: 20 mg/dL — AB
Nitrite: POSITIVE — AB
Protein, ur: 100 mg/dL — AB
Specific Gravity, Urine: 1.024 (ref 1.005–1.030)
pH: 6 (ref 5.0–8.0)

## 2021-10-14 LAB — COMPREHENSIVE METABOLIC PANEL
ALT: 22 U/L (ref 0–44)
AST: 24 U/L (ref 15–41)
Albumin: 3.9 g/dL (ref 3.5–5.0)
Alkaline Phosphatase: 79 U/L (ref 38–126)
Anion gap: 9 (ref 5–15)
BUN: 12 mg/dL (ref 6–20)
CO2: 24 mmol/L (ref 22–32)
Calcium: 9.3 mg/dL (ref 8.9–10.3)
Chloride: 106 mmol/L (ref 98–111)
Creatinine, Ser: 0.72 mg/dL (ref 0.44–1.00)
GFR, Estimated: >60 mL/min (ref >60)
Glucose, Bld: 131 mg/dL — ABNORMAL HIGH (ref 70–99)
Potassium: 3.3 mmol/L — ABNORMAL LOW (ref 3.5–5.1)
Sodium: 139 mmol/L (ref 135–145)
Total Bilirubin: 0.6 mg/dL (ref 0.3–1.2)
Total Protein: 7.8 g/dL (ref 6.5–8.1)

## 2021-10-14 LAB — I-STAT BETA HCG BLOOD, ED (MC, WL, AP ONLY): I-stat hCG, quantitative: <5 m[IU]/mL (ref <5)

## 2021-10-14 LAB — LIPASE, BLOOD: Lipase: 33 U/L (ref 11–51)

## 2021-10-14 MED ORDER — IOHEXOL 350 MG/ML SOLN
75.0000 mL | Freq: Once | INTRAVENOUS | Status: AC | PRN
  Administered 2021-10-14: 75 mL via INTRAVENOUS

## 2021-10-14 MED ORDER — ONDANSETRON 4 MG PO TBDP: 4.0000 mg | ORAL_TABLET | Freq: Three times a day (TID) | ORAL | 0 refills | Status: DC | PRN

## 2021-10-14 MED ORDER — SODIUM CHLORIDE 0.9 % IV BOLUS
1000.0000 mL | Freq: Once | INTRAVENOUS | Status: AC
  Administered 2021-10-14: 1000 mL via INTRAVENOUS

## 2021-10-14 MED ORDER — HYDROMORPHONE HCL 1 MG/ML IJ SOLN
1.0000 mg | Freq: Once | INTRAMUSCULAR | Status: AC
  Administered 2021-10-14: 1 mg via INTRAVENOUS
  Filled 2021-10-14: qty 1

## 2021-10-14 NOTE — ED Provider Notes (Signed)
[facility] DEPT Provider Note   CSN: [id number] Arrival date & time: 10/14/21  5573     History Chief Complaint  Patient presents with   Abdominal Pain    Darlene Bennett is a 45 y.o. female.  HPI 46 year old female presents with left-sided abdominal pain.  It is both left-sided and periumbilical.  Started 2 days ago.  It has been gradually worsening and continuous.  Feeling a sharp pain on the inside of her abdomen.  No vomiting, diarrhea/constipation, back pain, fever or urinary/vaginal symptoms.  The pain is rated as a 9.  She has not take anything for it.  Past Medical History:  Diagnosis Date   Gestational diabetes    insulin   Gestational diabetes mellitus, antepartum 02/14/2013   History of gestational diabetes    Hypertension    preeclampsia with previous pregnancy   Scalp cyst    Slurred speech    Stroke ([place])    8/21, speech difficulties    Patient Active Problem List   Diagnosis Date Noted   Knee pain, right 09/16/2020   Encounter for insertion of copper intrauterine contraceptive device (IUD) 08/16/2020   Hyperglycemia    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 ([place])    Blister of breast with infection, left, initial encounter 07/09/2020   Morbid obesity ([place]) 05/11/2020   Chronic back pain 10/31/2018   Right sciatic nerve pain 10/31/2018   Routine cervical smear 04/23/2018   Epidermoid cyst 08/04/2016   Preventative health care 02/20/2016   Contraceptive management 05/25/2015   Type 2 diabetes mellitus ([place]) 04/24/2015   Microalbuminuria due to type 2 diabetes mellitus ([place]) 03/25/2015   Hyperlipemia 03/09/2015   Tobacco use disorder 03/09/2015   Essential hypertension 11/15/2012    Past Surgical History:  Procedure Laterality Date   CYST EXCISION N/A 09/15/2016   Procedure: EXCISION POSTERIOR SCALP CYST;  Surgeon: Clovis Riley, MD;  Location: [facility];  Service: General;   Laterality: N/A;   TUBAL LIGATION     2007     OB History     Gravida  4   Para  4   Term  3   Preterm  1   AB  0   Living  4      SAB  0   IAB  0   Ectopic  0   Multiple  0   Live Births  4           Family History  Problem Relation Age of Onset   Heart disease Mother    Hypertension Mother    Diabetes Mother    Hypertension Father    Diabetes Father     Social History   Tobacco Use   Smoking status: Former    Packs/day: 0.25    Years: 22.00    Pack years: 5.50    Types: Cigarettes    Quit date: 05/2021    Years since quitting: 0.4   Smokeless tobacco: Never   Tobacco comments:    working on it now; has cut back from 0.5, quit 05/2021  Vaping Use   Vaping Use: Never used  Substance Use Topics   Alcohol use: No    Alcohol/week: 0.0 standard drinks   Drug use: No    Home Medications Prior to Admission medications   Medication Sig Start Date End Date Taking? Authorizing Provider  ondansetron (ZOFRAN ODT) 4 MG disintegrating tablet Take 1 tablet (4 mg  total) by mouth every 8 (eight) hours as needed for nausea or vomiting. 10/14/21  Yes Sherwood Gambler, MD  Accu-Chek Softclix Lancets lancets USE AS DIRECTED TO TEST BLOOD SUGAR FOUR TIMES DAILY 09/28/20   Lattie Haw, MD  amLODipine-benazepril (LOTREL) 5-40 MG capsule Take 1 capsule by mouth daily. 07/19/21   [provider]  aspirin EC 81 MG EC tablet Take 1 tablet (81 mg total) by mouth daily. Swallow whole. 07/23/20   Daisy Floro, DO  atorvastatin (LIPITOR) 80 MG tablet TAKE 1 TABLET(80 MG) BY MOUTH DAILY 01/22/21   Lattie Haw, MD  blood glucose meter kit and supplies KIT Dispense based on patient and insurance preference. Use up to four times daily as directed. (FOR ICD-9 250.00, 250.01). 07/24/20   Ezequiel Essex, MD  Blood Glucose Monitoring Suppl (ACCU-CHEK NANO SMARTVIEW) W/DEVICE KIT 1 Device by Does not apply route 4 (four) times daily -  before meals and at bedtime.  03/09/15   Katheren Shams, DO  diclofenac (VOLTAREN) 75 MG EC tablet Take 1 tablet (75 mg total) by mouth 2 (two) times daily. 07/29/21   Kathrynn Ducking, MD  ezetimibe (ZETIA) 10 MG tablet Take 1 tablet (10 mg total) by mouth daily. 09/15/20   Lattie Haw, MD  glucose blood test strip Use to check blood sugar once daily or as directed. 05/14/20   Lattie Haw, MD  Insulin Syringe/Needle U-500 (BD INSULIN SYRINGE U-500) 31G X 6MM 0.5 ML MISC 1 Units by Does not apply route daily. 11/02/20   Lattie Haw, MD  LANTUS SOLOSTAR 100 UNIT/ML Solostar Pen INJECT 20 UNITS INTO THE SKIN EVERY DAY 11/16/20   Lattie Haw, MD  lidocaine (LIDODERM) 5 % Place 1 patch onto the skin daily. Remove & Discard patch within 12 hours or as directed by MD 09/21/21   Sponseller, Eugene Garnet R, PA-C  losartan (COZAAR) 25 MG tablet TAKE 1 TABLET(25 MG) BY MOUTH AT BEDTIME 01/22/21   Lattie Haw, MD  metFORMIN (GLUMETZA) 1000 MG (MOD) 24 hr tablet Take 1,000 mg by mouth 2 (two) times daily. 06/25/21   [provider]  methocarbamol (ROBAXIN) 500 MG tablet Take 1 tablet (500 mg total) by mouth 2 (two) times daily. 09/21/21   Sponseller, Gypsy Balsam, PA-C    Allergies    Patient has no known allergies.  Review of Systems   Review of Systems  Constitutional:  Negative for fever.  Gastrointestinal:  Positive for abdominal pain. Negative for constipation, diarrhea and vomiting.  Genitourinary:  Negative for dysuria, vaginal bleeding and vaginal discharge.  Musculoskeletal:  Negative for back pain.  All other systems reviewed and are negative.  Physical Exam Updated Vital Signs BP 133/86   Pulse 63   Temp 98 F (36.7 C) (Oral)   Resp 18   Ht 6' (1.829 m)   Wt 124.7 kg   LMP 09/27/2021 (Approximate) Comment: negative HCG blood test 10-14-2021  SpO2 99%   BMI 37.30 kg/m   Physical Exam Vitals and nursing note reviewed.  Constitutional:      Appearance: She is well-developed.  HENT:     Head:  Normocephalic and atraumatic.     Right Ear: External ear normal.     Left Ear: External ear normal.     Nose: Nose normal.  Eyes:     General:        Right eye: No discharge.        Left eye: No discharge.  Cardiovascular:     Rate  and Rhythm: Normal rate and regular rhythm.     Heart sounds: Normal heart sounds.  Pulmonary:     Effort: Pulmonary effort is normal.     Breath sounds: Normal breath sounds.  Abdominal:     Palpations: Abdomen is soft.     Tenderness: There is abdominal tenderness in the right lower quadrant, left upper quadrant and left lower quadrant. There is no right CVA tenderness or left CVA tenderness.  Skin:    General: Skin is warm and dry.  Neurological:     Mental Status: She is alert.  Psychiatric:        Mood and Affect: Mood is not anxious.    ED Results / Procedures / Treatments   Labs (all labs ordered are listed, but only abnormal results are displayed) Labs Reviewed  COMPREHENSIVE METABOLIC PANEL - Abnormal; Notable for the following components:      Result Value   Potassium 3.3 (*)    Glucose, Bld 131 (*)    All other components within normal limits  URINALYSIS, ROUTINE W REFLEX MICROSCOPIC - Abnormal; Notable for the following components:   Color, Urine AMBER (*)    APPearance CLOUDY (*)    Ketones, ur 20 (*)    Protein, ur 100 (*)    Nitrite POSITIVE (*)    Leukocytes,Ua TRACE (*)    Bacteria, UA MANY (*)    All other components within normal limits  LIPASE, BLOOD  CBC  I-STAT BETA HCG BLOOD, ED ([facility], [facility], [facility] ONLY)    EKG None  Radiology CT ABDOMEN PELVIS W CONTRAST  Result Date: 10/14/2021 CLINICAL DATA:  Abdominal pain over the last 2 days. Worse with movement. EXAM: CT ABDOMEN AND PELVIS WITH CONTRAST TECHNIQUE: Multidetector CT imaging of the abdomen and pelvis was performed using the standard protocol following bolus administration of intravenous contrast. CONTRAST:  65m OMNIPAQUE IOHEXOL 350 MG/ML SOLN COMPARISON:  None.  FINDINGS: Lower chest: Lung bases are clear. Hepatobiliary: Liver parenchyma is normal.  No calcified gallstones. Pancreas: Normal Spleen: Normal Adrenals/Urinary Tract: Adrenal glands are normal. Kidneys are normal. Bladder is normal. Stomach/Bowel: Stomach and small intestine are normal. Normal appendix. No colon pathology. Vascular/Lymphatic: Aorta and IVC are normal. No retroperitoneal adenopathy. Reproductive: IUD in place, somewhat low, possibly extending through the cervix. Prominent ovarian venous system on the left. Is there any clinical suggestion of pelvic congestion syndrome? No evidence of adnexal mass. Other: No free fluid or air. Musculoskeletal: Chronic lower lumbar degenerative disc disease and degenerative facet disease which could be painful. IMPRESSION: No upper abdominal abnormality seen. IUD in place, somewhat low, possibly extending through the cervix. Prominent ovarian venous system on the left. Is there any clinical suggestion of pelvic congestion syndrome? Chronic degenerative changes of the lower lumbar spine which could be painful. Electronically Signed   By: MNelson ChimesM.D.   On: 10/14/2021 14:02    Procedures Procedures   Medications Ordered in ED Medications  HYDROmorphone (DILAUDID) injection 1 mg (1 mg Intravenous Given 10/14/21 1157)  sodium chloride 0.9 % bolus 1,000 mL (0 mLs Intravenous Stopped 10/14/21 1448)  iohexol (OMNIPAQUE) 350 MG/ML injection 75 mL (75 mLs Intravenous Contrast Given 10/14/21 1334)    ED Course  I have reviewed the triage vital signs and the nursing notes.  Pertinent labs & imaging results that were available during my care of the patient were reviewed by me and considered in my medical decision making (see chart for details).    MDM Rules/Calculators/A&P  Patient is feeling significantly better.  Labs are pretty unremarkable besides mild hypokalemia.  Otherwise, CT does not show any obvious acute pathology.   I did let her know about the IUD potentially being out of place as well as need for OB/GYN follow-up for this prominent ovarian venous system.  Her symptoms just started yesterday and so she would really meet criteria for pelvic congestion syndrome though she will need to follow-up with her OB/GYN.  Otherwise, her pain is primarily left-sided but is also diffuse.  I doubt ovarian torsion.  I did offer an ultrasound but she declines and wants to go home.  Given return precautions. Final Clinical Impression(s) / ED Diagnoses Final diagnoses:  Left sided abdominal pain    Rx / DC Orders ED Discharge Orders          Ordered    ondansetron (ZOFRAN ODT) 4 MG disintegrating tablet  Every 8 hours PRN        10/14/21 1437             Sherwood Gambler, MD 10/14/21 1502

## 2021-10-14 NOTE — Discharge Instructions (Addendum)
The CT scan seems to show that your IUD is a little bit lower than would be expected and you need to follow-up with your OB/GYN.  Your veins to your left ovary also seem a little congested and you should also follow-up with your OB/GYN for this.  If you develop worsening, continued, or recurrent abdominal pain, uncontrolled vomiting, fever, chest or back pain, or any other new/concerning symptoms then return to the ER for evaluation.

## 2021-10-14 NOTE — ED Triage Notes (Signed)
Pt reports abdominal pain x 2 days.  States pain is worse w/ movement or sneezing.  Denies n/v.

## 2021-10-19 ENCOUNTER — Ambulatory Visit (INDEPENDENT_AMBULATORY_CARE_PROVIDER_SITE_OTHER): Payer: Medicaid Other | Admitting: Family Medicine

## 2021-10-19 ENCOUNTER — Other Ambulatory Visit: Payer: Self-pay

## 2021-10-19 ENCOUNTER — Telehealth: Payer: Self-pay

## 2021-10-19 VITALS — BP 148/82 | HR 94 | Ht 72.0 in | Wt 256.8 lb

## 2021-10-19 DIAGNOSIS — R102 Pelvic and perineal pain: Secondary | ICD-10-CM

## 2021-10-19 DIAGNOSIS — N390 Urinary tract infection, site not specified: Secondary | ICD-10-CM

## 2021-10-19 DIAGNOSIS — Z30431 Encounter for routine checking of intrauterine contraceptive device: Secondary | ICD-10-CM | POA: Diagnosis not present

## 2021-10-19 LAB — POCT UA - MICROSCOPIC ONLY

## 2021-10-19 LAB — POCT URINALYSIS DIP (MANUAL ENTRY)
Blood, UA: NEGATIVE
Glucose, UA: NEGATIVE mg/dL
Leukocytes, UA: NEGATIVE
Nitrite, UA: POSITIVE — AB
Protein Ur, POC: 100 mg/dL — AB
Spec Grav, UA: >=1.03 — AB (ref 1.010–1.025)
Urobilinogen, UA: 0.2 E.U./dL
pH, UA: 5.5 (ref 5.0–8.0)

## 2021-10-19 MED ORDER — CEPHALEXIN 250 MG PO CAPS
250.0000 mg | ORAL_CAPSULE | Freq: Four times a day (QID) | ORAL | 0 refills | Status: AC
Start: 2021-10-19 — End: 2021-10-24

## 2021-10-19 NOTE — Assessment & Plan Note (Addendum)
Copper IUD placed on 08/17/2020. CT abd/pelvis on 10/14/2021 showed low positioning of IUD, possibly extending into cervix. Strings not visualized on exam today. -Pelvic ultrasound ordered to confirm presence of IUD -Patient counseled to use alternate method of contraception in the meantime, as IUD may have fallen out

## 2021-10-19 NOTE — Assessment & Plan Note (Signed)
UA in the ED a few days ago showed trace leuks and positive nitrites. Patient was not treated. Repeat UA today also showing positive nitrites. -Urine culture sent. -Given her ongoing suprapubic pain, will treat with Keflex 250mg  QID x5 days

## 2021-10-19 NOTE — Patient Instructions (Signed)
It was great to see you!  Things we discussed at today's visit: - Your urine shows signs of an infection. I have sent an antibiotic called Keflex to your pharmacy. Take this 4 times daily for 5 days. - We need to make sure your IUD is still in place. I have ordered an ultrasound. They will call you to schedule an appointment. - In the meantime, be sure to use an alternate form of birth control (abstinence, condoms, etc).  Take care and seek immediate care sooner if you develop any concerns.  Dr. [facility]

## 2021-10-19 NOTE — Progress Notes (Signed)
    SUBJECTIVE:   CHIEF COMPLAINT / HPI:   IUD Concern Patient presented to ED on 10/14/2021 for lower abdominal pain. She had a CT abdomen/pelvis which showed "IUD in place, somewhat low, possible extending through the cervix" and was advised to follow-up with her PCP. Patient's copper IUD was placed 08/17/2020. No known issues prior to now. She is not aware of her IUD strings, has never tried to feel them.  Suprapubic Pain Reports she is still having some suprapubic pain. Seems to be worse with movement. Denies nausea/vomiting/diarrhea. No urinary symptoms whatsoever (denies dysuria, frequency, urgency). No fever or chills. No pain w/intercourse. No vaginal discharge.  PERTINENT  PMH / PSH: HTN, CVA, T2DM  OBJECTIVE:   BP (!) 148/82   Pulse 94   Ht 6' (1.829 m)   Wt 256 lb 12.8 oz (116.5 kg)   LMP 09/27/2021 (Approximate) Comment: negative HCG blood test  SpO2 99%   BMI 34.83 kg/m   General: NAD, pleasant, able to participate in exam Respiratory: No respiratory distress Skin: warm and dry, no rashes noted Psych: Normal affect and mood Neuro: grossly intact  GU/GYN: Exam performed in the presence of a chaperone. External genitalia within normal limits.  Vaginal mucosa pink, moist, normal rugae.  Nonfriable cervix without lesions, no bleeding or abnormal discharge noted on speculum exam. IUD strings were NOT visualized.    ASSESSMENT/PLAN:   Contraceptive management Copper IUD placed on 08/17/2020. CT abd/pelvis on 10/14/2021 showed low positioning of IUD, possibly extending into cervix. Strings not visualized on exam today. -Pelvic ultrasound ordered to confirm presence of IUD -Patient counseled to use alternate method of contraception in the meantime, as IUD may have fallen out  Urinary tract infection without hematuria UA in the ED a few days ago showed trace leuks and positive nitrites. Patient was not treated. Repeat UA today also showing positive nitrites. -Urine culture  sent. -Given her ongoing suprapubic pain, will treat with Keflex 250mg  QID x5 days   Patient declines flu vaccine today.  Alcus Dad, MD [place]

## 2021-10-19 NOTE — Telephone Encounter (Signed)
Called pt. LVM asking for a return call to give [place] appt information. If pt calls, please give her the following information:  [place] Wed 11/23 Arrive at 2:15 in the Radiology Dept.  1 hour before appt, please empty bladder and drink 32 oz of water and hold urine. MUST have a full bladder for the test. If bladder is not full they may have her reschedule. Ottis Stain, CMA

## 2021-10-19 NOTE — Telephone Encounter (Signed)
Patient informed. 

## 2021-10-25 ENCOUNTER — Other Ambulatory Visit: Payer: Self-pay

## 2021-10-25 ENCOUNTER — Emergency Department (HOSPITAL_COMMUNITY): Payer: Medicaid Other

## 2021-10-25 ENCOUNTER — Telehealth: Payer: Self-pay

## 2021-10-25 ENCOUNTER — Emergency Department (HOSPITAL_COMMUNITY)
Admission: EM | Admit: 2021-10-25 | Discharge: 2021-10-25 | Disposition: A | Discharge status: Home / Self Care | Payer: Medicaid Other | Attending: Emergency Medicine | Admitting: Emergency Medicine

## 2021-10-25 DIAGNOSIS — Z79899 Other long term (current) drug therapy: Secondary | ICD-10-CM | POA: Diagnosis not present

## 2021-10-25 DIAGNOSIS — Z7984 Long term (current) use of oral hypoglycemic drugs: Secondary | ICD-10-CM | POA: Insufficient documentation

## 2021-10-25 DIAGNOSIS — Z794 Long term (current) use of insulin: Secondary | ICD-10-CM | POA: Diagnosis not present

## 2021-10-25 DIAGNOSIS — E119 Type 2 diabetes mellitus without complications: Secondary | ICD-10-CM | POA: Insufficient documentation

## 2021-10-25 DIAGNOSIS — I10 Essential (primary) hypertension: Secondary | ICD-10-CM | POA: Insufficient documentation

## 2021-10-25 DIAGNOSIS — Z87891 Personal history of nicotine dependence: Secondary | ICD-10-CM | POA: Diagnosis not present

## 2021-10-25 DIAGNOSIS — N9489 Other specified conditions associated with female genital organs and menstrual cycle: Secondary | ICD-10-CM | POA: Insufficient documentation

## 2021-10-25 DIAGNOSIS — T8332XA Displacement of intrauterine contraceptive device, initial encounter: Secondary | ICD-10-CM

## 2021-10-25 DIAGNOSIS — N939 Abnormal uterine and vaginal bleeding, unspecified: Secondary | ICD-10-CM | POA: Diagnosis present

## 2021-10-25 LAB — BASIC METABOLIC PANEL
Anion gap: 8 (ref 5–15)
BUN: 10 mg/dL (ref 6–20)
CO2: 22 mmol/L (ref 22–32)
Calcium: 9.2 mg/dL (ref 8.9–10.3)
Chloride: 108 mmol/L (ref 98–111)
Creatinine, Ser: 0.9 mg/dL (ref 0.44–1.00)
GFR, Estimated: >60 mL/min (ref >60)
Glucose, Bld: 92 mg/dL (ref 70–99)
Potassium: 3.6 mmol/L (ref 3.5–5.1)
Sodium: 138 mmol/L (ref 135–145)

## 2021-10-25 LAB — CBC WITH DIFFERENTIAL/PLATELET
Abs Immature Granulocytes: 0.02 10*3/uL (ref 0.00–0.07)
Basophils Absolute: 0 10*3/uL (ref 0.0–0.1)
Basophils Relative: 0 %
Eosinophils Absolute: 0 10*3/uL (ref 0.0–0.5)
Eosinophils Relative: 0 %
HCT: 38.3 % (ref 36.0–46.0)
Hemoglobin: 12.7 g/dL (ref 12.0–15.0)
Immature Granulocytes: 0 %
Lymphocytes Relative: 35 %
Lymphs Abs: 2.5 10*3/uL (ref 0.7–4.0)
MCH: 29.7 pg (ref 26.0–34.0)
MCHC: 33.2 g/dL (ref 30.0–36.0)
MCV: 89.5 fL (ref 80.0–100.0)
Monocytes Absolute: 0.4 10*3/uL (ref 0.1–1.0)
Monocytes Relative: 5 %
Neutro Abs: 4.1 10*3/uL (ref 1.7–7.7)
Neutrophils Relative %: 60 %
Platelets: 376 10*3/uL (ref 150–400)
RBC: 4.28 MIL/uL (ref 3.87–5.11)
RDW: 12.4 % (ref 11.5–15.5)
WBC: 7.1 10*3/uL (ref 4.0–10.5)
nRBC: 0 % (ref 0.0–0.2)

## 2021-10-25 LAB — I-STAT BETA HCG BLOOD, ED (MC, WL, AP ONLY): I-stat hCG, quantitative: <5 m[IU]/mL (ref <5)

## 2021-10-25 NOTE — ED Triage Notes (Signed)
Pt here POV with c/o vaginal bleeding since 11/15. Pt has [place] appt Wednesday to locate IUD. Pt endorses increased bleeding using 6 pads /day.

## 2021-10-25 NOTE — ED Provider Notes (Addendum)
[facility] EMERGENCY DEPARTMENT Provider Note   CSN: [id number] Arrival date & time: 10/25/21  1231     History Chief Complaint  Patient presents with   Vaginal Bleeding    Darlene Bennett is a 45 y.o. female.   Vaginal Bleeding Associated symptoms: no abdominal pain, no back pain, no dysuria, no fever and no vaginal discharge    45 year old female with medical history of below to include IUD placement 1 year ago who presents emergency department with complaint of vaginal bleeding.  Patient states that she saw her OB/GYN in clinic during which time last week the strings were not visualized on speculum exam.  She had an outpatient ultrasound that was ordered and scheduled for this Wednesday upcoming to further evaluate.  She states that since this past Tuesday, 10/19/2021, she has had constant vaginal bleeding.  She is gone through 12 pads in total since Tuesday.  She denies any pelvic pain, vaginal discharge, fevers or chills.  Since IUD placement she has not had any menstrual cycles.  She denies dysuria.  Past Medical History:  Diagnosis Date   Gestational diabetes    insulin   Gestational diabetes mellitus, antepartum 02/14/2013   History of gestational diabetes    Hypertension    preeclampsia with previous pregnancy   Scalp cyst    Slurred speech    Stroke ([place])    8/21, speech difficulties    Patient Active Problem List   Diagnosis Date Noted   Urinary tract infection without hematuria 10/19/2021   Knee pain, right 09/16/2020   Encounter for insertion of copper intrauterine contraceptive device (IUD) 08/16/2020   Hyperglycemia    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 ([place])    Blister of breast with infection, left, initial encounter 07/09/2020   Morbid obesity ([place]) 05/11/2020   Chronic back pain 10/31/2018   Right sciatic nerve pain 10/31/2018   Routine cervical smear 04/23/2018   Epidermoid cyst  08/04/2016   Preventative health care 02/20/2016   Contraceptive management 05/25/2015   Type 2 diabetes mellitus ([place]) 04/24/2015   Microalbuminuria due to type 2 diabetes mellitus ([place]) 03/25/2015   Hyperlipemia 03/09/2015   Tobacco use disorder 03/09/2015   Essential hypertension 11/15/2012    Past Surgical History:  Procedure Laterality Date   CYST EXCISION N/A 09/15/2016   Procedure: EXCISION POSTERIOR SCALP CYST;  Surgeon: Clovis Riley, MD;  Location: [facility];  Service: General;  Laterality: N/A;   TUBAL LIGATION     2007     OB History     Gravida  4   Para  4   Term  3   Preterm  1   AB  0   Living  4      SAB  0   IAB  0   Ectopic  0   Multiple  0   Live Births  4           Family History  Problem Relation Age of Onset   Heart disease Mother    Hypertension Mother    Diabetes Mother    Hypertension Father    Diabetes Father     Social History   Tobacco Use   Smoking status: Former    Packs/day: 0.25    Years: 22.00    Pack years: 5.50    Types: Cigarettes    Quit date: 05/2021    Years since quitting: 0.4   Smokeless tobacco: Never  Tobacco comments:    working on it now; has cut back from 0.5, quit 05/2021  Vaping Use   Vaping Use: Never used  Substance Use Topics   Alcohol use: No    Alcohol/week: 0.0 standard drinks   Drug use: No    Home Medications Prior to Admission medications   Medication Sig Start Date End Date Taking? Authorizing Provider  Accu-Chek Softclix Lancets lancets USE AS DIRECTED TO TEST BLOOD SUGAR FOUR TIMES DAILY 09/28/20   Lattie Haw, MD  amLODipine-benazepril (LOTREL) 5-40 MG capsule Take 1 capsule by mouth daily. 07/19/21   [provider]  aspirin EC 81 MG EC tablet Take 1 tablet (81 mg total) by mouth daily. Swallow whole. 07/23/20   Daisy Floro, DO  atorvastatin (LIPITOR) 80 MG tablet TAKE 1 TABLET(80 MG) BY MOUTH DAILY 01/22/21   Lattie Haw, MD  blood glucose meter kit  and supplies KIT Dispense based on patient and insurance preference. Use up to four times daily as directed. (FOR ICD-9 250.00, 250.01). 07/24/20   Ezequiel Essex, MD  Blood Glucose Monitoring Suppl (ACCU-CHEK NANO SMARTVIEW) W/DEVICE KIT 1 Device by Does not apply route 4 (four) times daily -  before meals and at bedtime. 03/09/15   Katheren Shams, DO  diclofenac (VOLTAREN) 75 MG EC tablet Take 1 tablet (75 mg total) by mouth 2 (two) times daily. 07/29/21   Kathrynn Ducking, MD  ezetimibe (ZETIA) 10 MG tablet Take 1 tablet (10 mg total) by mouth daily. 09/15/20   Lattie Haw, MD  glucose blood test strip Use to check blood sugar once daily or as directed. 05/14/20   Lattie Haw, MD  Insulin Syringe/Needle U-500 (BD INSULIN SYRINGE U-500) 31G X 6MM 0.5 ML MISC 1 Units by Does not apply route daily. 11/02/20   Lattie Haw, MD  LANTUS SOLOSTAR 100 UNIT/ML Solostar Pen INJECT 20 UNITS INTO THE SKIN EVERY DAY 11/16/20   Lattie Haw, MD  lidocaine (LIDODERM) 5 % Place 1 patch onto the skin daily. Remove & Discard patch within 12 hours or as directed by MD 09/21/21   Sponseller, Eugene Garnet R, PA-C  losartan (COZAAR) 25 MG tablet TAKE 1 TABLET(25 MG) BY MOUTH AT BEDTIME 01/22/21   Lattie Haw, MD  metFORMIN (GLUMETZA) 1000 MG (MOD) 24 hr tablet Take 1,000 mg by mouth 2 (two) times daily. 06/25/21   [provider]  methocarbamol (ROBAXIN) 500 MG tablet Take 1 tablet (500 mg total) by mouth 2 (two) times daily. 09/21/21   Sponseller, Rebekah R, PA-C  ondansetron (ZOFRAN ODT) 4 MG disintegrating tablet Take 1 tablet (4 mg total) by mouth every 8 (eight) hours as needed for nausea or vomiting. 10/14/21   Sherwood Gambler, MD    Allergies    Patient has no known allergies.  Review of Systems   Review of Systems  Constitutional:  Negative for chills and fever.  HENT:  Negative for ear pain and sore throat.   Eyes:  Negative for pain and visual disturbance.  Respiratory:  Negative for cough and  shortness of breath.   Cardiovascular:  Negative for chest pain and palpitations.  Gastrointestinal:  Negative for abdominal pain and vomiting.  Genitourinary:  Positive for vaginal bleeding. Negative for dysuria, hematuria, pelvic pain, vaginal discharge and vaginal pain.  Musculoskeletal:  Negative for arthralgias and back pain.  Skin:  Negative for color change and rash.  Neurological:  Negative for seizures and syncope.  All other systems reviewed and are negative.  Physical  Exam Updated Vital Signs BP (!) 179/100   Pulse 83   Temp 98.2 F (36.8 C)   Resp 18   LMP 09/27/2021 (Approximate) Comment: negative HCG blood test  SpO2 100%   Physical Exam Vitals and nursing note reviewed.  Constitutional:      General: She is not in acute distress. HENT:     Head: Normocephalic and atraumatic.  Eyes:     Conjunctiva/sclera: Conjunctivae normal.     Pupils: Pupils are equal, round, and reactive to light.  Cardiovascular:     Rate and Rhythm: Normal rate and regular rhythm.  Pulmonary:     Effort: Pulmonary effort is normal. No respiratory distress.  Abdominal:     General: There is no distension.     Tenderness: There is no guarding.  Genitourinary:    Comments: Dark blood noted in the anterior vaginal fornix, removed with Fox swabs, IUD noted in the cervix, appears partially expelled. Musculoskeletal:        General: No deformity or signs of injury.     Cervical back: Neck supple.  Skin:    Findings: No lesion or rash.  Neurological:     General: No focal deficit present.     Mental Status: She is alert. Mental status is at baseline.    ED Results / Procedures / Treatments   Labs (all labs ordered are listed, but only abnormal results are displayed) Labs Reviewed  BASIC METABOLIC PANEL  CBC WITH DIFFERENTIAL/PLATELET  I-STAT BETA HCG BLOOD, ED ([facility], [facility], [facility] ONLY)    EKG None  Radiology No results found.  Procedures .Foreign Body Removal  Date/Time:  10/25/2021 5:55 PM Performed by: Regan Lemming, MD Authorized by: Regan Lemming, MD  Consent: Verbal consent obtained. Risks and benefits: risks, benefits and alternatives were discussed Consent given by: patient Patient identity confirmed: verbally with patient Time out: Immediately prior to procedure a "time out" was called to verify the correct patient, procedure, equipment, support staff and site/side marked as required. Body area: vagina  Sedation: Patient sedated: no  Patient restrained: no Patient cooperative: yes Localization method: speculum Removal mechanism: ring forceps Complexity: simple 1 objects recovered. Objects recovered: IUD Post-procedure assessment: foreign body removed Patient tolerance: patient tolerated the procedure well with no immediate complications Comments: IUD noted to be partially expelled from the cervix, visualized and subsequently removed via ring forceps    Medications Ordered in ED Medications - No data to display  ED Course  I have reviewed the triage vital signs and the nursing notes.  Pertinent labs & imaging results that were available during my care of the patient were reviewed by me and considered in my medical decision making (see chart for details).    MDM Rules/Calculators/A&P                            45 year old female with medical history of below to include IUD placement 1 year ago who presents emergency department with complaint of vaginal bleeding.  Patient states that she saw her OB/GYN in clinic during which time last week the strings were not visualized on speculum exam.  She had an outpatient ultrasound that was ordered and scheduled for this Wednesday upcoming to further evaluate.  She states that since this past Tuesday, 10/19/2021, she has had constant vaginal bleeding.  She is gone through 12 pads in total since Tuesday.  She denies any pelvic pain, vaginal discharge, fevers or chills.  Since  IUD placement she has  not had any menstrual cycles.  She denies dysuria.  On arrival, the patient was afebrile, hemodynamically stable,  mildly hypertensive BP 183/102, saturating 1% on room air.  Initial work-up from triage revealed a normal/negative i-STAT beta-hCG, BMP without abnormality, CBC without a leukocytosis or anemia, hemoglobin 12.7.  On exam, the patient's IUD was noted to be partially expelled from the cervix with vaginal bleeding present.  No pelvic discomfort or pain but ongoing bleeding.  The patient wishes to have the IUD removed.  IUD was successfully removed as per procedure note above with ring forceps.  Patient will follow-up in clinic with her OB/GYN for a recheck and further discussion of contraceptive options.  The patient was counseled that she currently is without contraception at this time now that her IUD is removed.  Final Clinical Impression(s) / ED Diagnoses Final diagnoses:  Vaginal bleeding  Displacement of intrauterine contraceptive device, initial encounter    Rx / DC Orders ED Discharge Orders     None        Regan Lemming, MD 10/25/21 1757    Regan Lemming, MD 10/25/21 1758

## 2021-10-25 NOTE — ED Provider Notes (Signed)
Emergency Medicine Provider Triage Evaluation Note  Darlene Bennett , a 45 y.o. female  was evaluated in triage.  Pt complains of vaginal bleeding.  Started 5 or 6 days ago, its been constant.  Going through 2 or 3 pads a day.  Some intermittent abdominal pain, no dysuria.  Has an IUD placed 1 year ago, usually does not have menstrual cycles.  Review of Systems  Positive: Above Negative: ABOVE  Physical Exam  BP (!) 182/102 (BP Location: Left Arm)   Pulse 76   Temp 98.2 F (36.8 C)   Resp 18   LMP 09/27/2021 (Approximate) Comment: negative HCG blood test  SpO2 100%  Gen:   Awake, no distress   Resp:  Normal effort  MSK:   Moves extremities without difficulty  Other:  Abdomen is soft and nontender  Medical Decision Making  Medically screening exam initiated at 1:07 PM.  Appropriate orders placed.  Darlene Bennett was informed that the remainder of the evaluation will be completed by another provider, this initial triage assessment does not replace that evaluation, and the importance of remaining in the ED until their evaluation is complete.  Vaginal bleed   Sherrill Raring, PA-C 10/25/21 1307    Daleen Bo, MD 10/25/21 [phone]

## 2021-10-25 NOTE — Telephone Encounter (Signed)
Patient calls nurse line regarding heavy vaginal bleeding. Onset of bleeding since Tuesday.  Reports having to wear two pads due to amount of bleeding. Patient states that she is saturating these pads within one hour.   Denies lightheadedness, dizziness, nausea or vomiting.   Advised that due to amount of bleeding that patient be evaluated in UC/ED.   Patient verbalizes understanding.   Talbot Grumbling, RN

## 2021-10-25 NOTE — Discharge Instructions (Addendum)
Your IUD was noted to be partially expelled from the cervix and after discussion was removed.  Your bleeding may be due to a menstrual cycle due to the malposition of your IUD.  Recommend follow-up with your OB/GYN this week in clinic for recheck and further discussion of contraceptive options.  Keep in mind you are at risk of getting pregnant now that your IUD is out.

## 2021-10-25 NOTE — ED Notes (Signed)
Called.

## 2021-10-26 LAB — URINE CULTURE

## 2021-10-27 ENCOUNTER — Ambulatory Visit (HOSPITAL_COMMUNITY): Admission: RE | Admit: 2021-10-27 | Payer: Medicaid Other | Source: Ambulatory Visit

## 2021-11-04 MED ORDER — NITROFURANTOIN MONOHYD MACRO 100 MG PO CAPS: 100.0000 mg | ORAL_CAPSULE | Freq: Two times a day (BID) | ORAL | 0 refills | Status: AC

## 2021-11-04 NOTE — Addendum Note (Signed)
Addended by: Alcus Dad on: 11/04/2021 09:25 AM   Modules accepted: Orders

## 2021-11-05 ENCOUNTER — Ambulatory Visit (INDEPENDENT_AMBULATORY_CARE_PROVIDER_SITE_OTHER): Payer: Medicaid Other | Admitting: Family Medicine

## 2021-11-05 ENCOUNTER — Other Ambulatory Visit: Payer: Self-pay

## 2021-11-05 ENCOUNTER — Encounter: Payer: Self-pay | Admitting: Family Medicine

## 2021-11-05 VITALS — BP 176/94 | HR 80 | Wt 254.0 lb

## 2021-11-05 DIAGNOSIS — Z308 Encounter for other contraceptive management: Secondary | ICD-10-CM

## 2021-11-05 DIAGNOSIS — Z3002 Counseling and instruction in natural family planning to avoid pregnancy: Secondary | ICD-10-CM | POA: Diagnosis not present

## 2021-11-05 DIAGNOSIS — Z30430 Encounter for insertion of intrauterine contraceptive device: Secondary | ICD-10-CM

## 2021-11-05 DIAGNOSIS — Z30019 Encounter for initial prescription of contraceptives, unspecified: Secondary | ICD-10-CM | POA: Diagnosis not present

## 2021-11-05 LAB — POCT URINE PREGNANCY: Preg Test, Ur: NEGATIVE

## 2021-11-05 NOTE — Progress Notes (Signed)
    SUBJECTIVE:   CHIEF COMPLAINT / HPI: requesting IUD reinsertion  Reports was having heavy vaginal bleeding,now resolved.  Seen by Gaylord Shih here at [facility] and reports that IUD strings not visible. Pelvic ultrasound ordered at that time but patient went ED for continued heavy vaginal bleeding. Bedside exam notable for partially expelled IUD which was successfully. removed.  Sexually active last night. No condoms.  OCP/Depo contraindicated.    PERTINENT  PMH / PSH:  CVA 2021  OBJECTIVE:   BP (!) 176/94   Pulse 80   Wt 254 lb (115.2 kg)   SpO2 100%   BMI 34.45 kg/m    General: Alert, no acute distress, frustrated and t4earful   ASSESSMENT/PLAN:   Encounter for contraceptive management Unfortunately, unable to insert IUD as scheduled appointment was not allotted appropriate time and no Copper IUD had been ordered for patient.  Apologized to patient for miscommunication that appointment was  for consult for IUD insertion vs procedure.  Discussed with patient options to reschedule for next available  appointment or in [place] clinic and declined.  Offered condoms. Checked with nursing staff to see if IUD could be ordered for next week and I would have staff open slot for me come into insert IUD pending Preceptor schedule.  Fortunately there was a copper IUD that was available that could be used today and offered to place IUD after clinic and conference this afternoon. Initially agreed. When provider ready to perform procedure patient had left. UPTnegative    Carollee Leitz, MD [place]

## 2021-11-07 ENCOUNTER — Encounter: Payer: Self-pay | Admitting: Family Medicine

## 2021-11-07 DIAGNOSIS — Z309 Encounter for contraceptive management, unspecified: Secondary | ICD-10-CM | POA: Insufficient documentation

## 2021-11-07 NOTE — Assessment & Plan Note (Signed)
Unfortunately, unable to insert IUD as scheduled appointment was not allotted appropriate time and no Copper IUD had been ordered for patient.  Apologized to patient for miscommunication that appointment was  for consult for IUD insertion vs procedure.  Discussed with patient options to reschedule for next available  appointment or in [place] clinic and declined.  Offered condoms. Checked with nursing staff to see if IUD could be ordered for next week and I would have staff open slot for me come into insert IUD pending Preceptor schedule.  Fortunately there was a copper IUD that was available that could be used today and offered to place IUD after clinic and conference this afternoon. Initially agreed. When provider ready to perform procedure patient had left. UPTnegative

## 2021-11-07 NOTE — Assessment & Plan Note (Deleted)
Unfortunately, unable to insert IUD as scheduled appointment was not allotted appropriate time and no Copper IUD had been ordered for patient.  Apologized to patient for miscommunication that appointment was  for consult for IUD insertion vs procedure.  Discussed with patient options to reschedule for next available  appointment or in [place] clinic and declined.  Offered condoms. Checked with nursing staff to see if IUD could be ordered for next week and I would have staff open slot for me come into insert IUD pending Preceptor schedule.  Fortunately there was a copper IUD that was available that could be used today and offered to place IUD after clinic and conference this afternoon. Initially agreed. When provider ready to perform procedure patient had left. UPTnegative

## 2021-11-18 ENCOUNTER — Ambulatory Visit: Payer: Medicaid Other

## 2021-11-18 NOTE — Progress Notes (Deleted)
No chief complaint on file.    HISTORY OF PRESENT ILLNESS:  11/18/21 ALL:  Darlene Bennett is a 45 y.o. female here today for follow up for shoulder pain and previous CVA. She was seen by Dr Jannifer Franklin 07/2021 for acute left shoulder pain starting about a month prior. She was given diclofenac 57m BID and referred to PT.   She continues asa 829mand atorvastatin 8068maily. She is followed closely by PCP. Last A1C 6.3 05/2021. LDL 192 07/2020.    HISTORY (copied from Dr WilJannifer Franklinrevious note)  Darlene Bennett a 45 40ar old left-handed black female with a history of hypertension and cerebrovascular disease, the patient was seen previously for a right frontal stroke that occurred on 20 July 2020.  She has done well following the stroke, but she comes in today with a new problem.  The patient noted spontaneous onset of left shoulder discomfort that occurred upon awakening about a month ago and has been persistent since that time.  She does not note any particular movement that will bring on pain, but if she tries to reach around behind her back that will increase the pain.  The pain is around the shoulder joint and in the axillary region on the left without radiation down the arm.  The patient denies any neck pain and she denies any increase in discomfort with turning her head from side to side or looking up or looking down.  She reports no weakness of the extremities, she denies any numbness or tingling.  She has no balance changes or difficulty controlling the bowels or the bladder.  It will hurt her to lay on her left shoulder at night while sleeping.  She comes to the office today for further evaluation.     REVIEW OF SYSTEMS: Out of a complete 14 system review of symptoms, the patient complains only of the following symptoms, and all other reviewed systems are negative.   ALLERGIES: No Known Allergies   HOME MEDICATIONS: Outpatient Medications Prior to Visit  Medication Sig Dispense  Refill   Accu-Chek Softclix Lancets lancets USE AS DIRECTED TO TEST BLOOD SUGAR FOUR TIMES DAILY 100 each 3   amLODipine-benazepril (LOTREL) 5-40 MG capsule Take 1 capsule by mouth daily.     aspirin EC 81 MG EC tablet Take 1 tablet (81 mg total) by mouth daily. Swallow whole. 90 tablet 1   atorvastatin (LIPITOR) 80 MG tablet TAKE 1 TABLET(80 MG) BY MOUTH DAILY 90 tablet 1   blood glucose meter kit and supplies KIT Dispense based on patient and insurance preference. Use up to four times daily as directed. (FOR ICD-9 250.00, 250.01). 1 each 0   Blood Glucose Monitoring Suppl (ACCU-CHEK NANO SMARTVIEW) W/DEVICE KIT 1 Device by Does not apply route 4 (four) times daily -  before meals and at bedtime. 1 kit 0   diclofenac (VOLTAREN) 75 MG EC tablet Take 1 tablet (75 mg total) by mouth 2 (two) times daily. 60 tablet 1   ezetimibe (ZETIA) 10 MG tablet Take 1 tablet (10 mg total) by mouth daily. 90 tablet 3   glucose blood test strip Use to check blood sugar once daily or as directed. 30 each 5   Insulin Syringe/Needle U-500 (BD INSULIN SYRINGE U-500) 31G X 6MM 0.5 ML MISC 1 Units by Does not apply route daily. 90 each 2   LANTUS SOLOSTAR 100 UNIT/ML Solostar Pen INJECT 20 UNITS INTO THE SKIN EVERY DAY 15 mL 1   lidocaine (LIDODERM)  5 % Place 1 patch onto the skin daily. Remove & Discard patch within 12 hours or as directed by MD 30 patch 0   losartan (COZAAR) 25 MG tablet TAKE 1 TABLET(25 MG) BY MOUTH AT BEDTIME 30 tablet 0   metFORMIN (GLUMETZA) 1000 MG (MOD) 24 hr tablet Take 1,000 mg by mouth 2 (two) times daily.     methocarbamol (ROBAXIN) 500 MG tablet Take 1 tablet (500 mg total) by mouth 2 (two) times daily. 12 tablet 0   ondansetron (ZOFRAN ODT) 4 MG disintegrating tablet Take 1 tablet (4 mg total) by mouth every 8 (eight) hours as needed for nausea or vomiting. 10 tablet 0   No facility-administered medications prior to visit.     PAST MEDICAL HISTORY: Past Medical History:  Diagnosis Date    Gestational diabetes    insulin   Gestational diabetes mellitus, antepartum 02/14/2013   History of gestational diabetes    Hypertension    preeclampsia with previous pregnancy   Scalp cyst    Slurred speech    Stroke ([place])    8/21, speech difficulties     PAST SURGICAL HISTORY: Past Surgical History:  Procedure Laterality Date   CYST EXCISION N/A 09/15/2016   Procedure: EXCISION POSTERIOR SCALP CYST;  Surgeon: Clovis Riley, MD;  Location: [facility];  Service: General;  Laterality: N/A;   TUBAL LIGATION     2007     FAMILY HISTORY: Family History  Problem Relation Age of Onset   Heart disease Mother    Hypertension Mother    Diabetes Mother    Hypertension Father    Diabetes Father      SOCIAL HISTORY: Social History   Socioeconomic History   Marital status: Married    Spouse name: Nicole Kindred   Number of children: 4   Years of education: Not on file   Highest education level: Not on file  Occupational History   Occupation: Part time    Comment: Nurse Aide  Tobacco Use   Smoking status: Some Days    Packs/day: 0.25    Years: 22.00    Pack years: 5.50    Types: Cigarettes    Last attempt to quit: 05/2021    Years since quitting: 0.5   Smokeless tobacco: Never   Tobacco comments:    working on it now; has cut back from 0.5, quit 05/2021  Vaping Use   Vaping Use: Never used  Substance and Sexual Activity   Alcohol use: No    Alcohol/week: 0.0 standard drinks   Drug use: No   Sexual activity: Yes    Birth control/protection: Injection  Other Topics Concern   Not on file  Social History Narrative   Lives with husband and 2 kids   Left handed   Drinks 1 cup of caffeine daily   Social Determinants of Health   Financial Resource Strain: Not on file  Food Insecurity: Not on file  Transportation Needs: Not on file  Physical Activity: Not on file  Stress: Not on file  Social Connections: Not on file  Intimate Partner Violence: Not on file      PHYSICAL EXAM  There were no vitals filed for this visit. There is no height or weight on file to calculate BMI.  Generalized: Well developed, in no acute distress  Cardiology: normal rate and rhythm, no murmur auscultated  Respiratory: clear to auscultation bilaterally    Neurological examination  Mentation: Alert oriented to time, place, history taking. Follows all commands  speech and language fluent Cranial nerve II-XII: Pupils were equal round reactive to light. Extraocular movements were full, visual field were full on confrontational test. Facial sensation and strength were normal. Uvula tongue midline. Head turning and shoulder shrug  were normal and symmetric. Motor: The motor testing reveals 5 over 5 strength of all 4 extremities. Good symmetric motor tone is noted throughout.  Sensory: Sensory testing is intact to soft touch on all 4 extremities. No evidence of extinction is noted.  Coordination: Cerebellar testing reveals good finger-nose-finger and heel-to-shin bilaterally.  Gait and station: Gait is normal. Tandem gait is normal. Romberg is negative. No drift is seen.  Reflexes: Deep tendon reflexes are symmetric and normal bilaterally.    DIAGNOSTIC DATA (LABS, IMAGING, TESTING) - I reviewed patient records, labs, notes, testing and imaging myself where available.  Lab Results  Component Value Date   WBC 7.1 10/25/2021   HGB 12.7 10/25/2021   HCT 38.3 10/25/2021   MCV 89.5 10/25/2021   PLT 376 10/25/2021      Component Value Date/Time   NA 138 10/25/2021 1309   NA 136 06/25/2020 0946   K 3.6 10/25/2021 1309   CL 108 10/25/2021 1309   CO2 22 10/25/2021 1309   GLUCOSE 92 10/25/2021 1309   GLUCOSE 130 (H) 02/12/2013 1057   BUN 10 10/25/2021 1309   BUN 17 06/25/2020 0946   CREATININE 0.90 10/25/2021 1309   CREATININE 0.91 03/09/2015 1143   CALCIUM 9.2 10/25/2021 1309   PROT 7.8 10/14/2021 1152   ALBUMIN 3.9 10/14/2021 1152   AST 24 10/14/2021 1152    ALT 22 10/14/2021 1152   ALKPHOS 79 10/14/2021 1152   BILITOT 0.6 10/14/2021 1152   GFRNONAA >60 10/25/2021 1309   GFRAA >60 07/22/2020 0536   Lab Results  Component Value Date   CHOL 279 (H) 07/21/2020   HDL 29 (L) 07/21/2020   LDLCALC 192 (H) 07/21/2020   TRIG 288 (H) 07/21/2020   CHOLHDL 9.6 07/21/2020   Lab Results  Component Value Date   HGBA1C 6.3 05/07/2021   No results found for: VITAMINB12 No results found for: TSH  No flowsheet data found.   No flowsheet data found.   ASSESSMENT AND PLAN  45 y.o. year old female  has a past medical history of Gestational diabetes, Gestational diabetes mellitus, antepartum (02/14/2013), History of gestational diabetes, Hypertension, Scalp cyst, Slurred speech, and Stroke ([place]). here with    No diagnosis found.   No orders of the defined types were placed in this encounter.    No orders of the defined types were placed in this encounter.     Debbora Presto, MSN, FNP-C 11/18/2021, 10:55 AM  [facility] [address] [place], [place] [address] [phone]

## 2021-11-18 NOTE — Patient Instructions (Incomplete)

## 2021-11-22 ENCOUNTER — Encounter: Payer: Self-pay | Admitting: Family Medicine

## 2021-11-22 ENCOUNTER — Ambulatory Visit: Payer: Medicaid Other | Admitting: Family Medicine

## 2021-11-24 ENCOUNTER — Telehealth: Payer: Self-pay

## 2021-11-24 DIAGNOSIS — Z30431 Encounter for routine checking of intrauterine contraceptive device: Secondary | ICD-10-CM

## 2021-11-24 NOTE — Telephone Encounter (Signed)
Patient calls nurse line regarding follow up from appointment with Dr. Volanda Napoleon on 12/2. Patient states that due to history of issues with IUD, she would like to pursue getting tubal ligation.   Please advise if referral can be placed to OBGYN or if patient needs additional appointment to discuss.   Talbot Grumbling, RN

## 2021-11-24 NOTE — Telephone Encounter (Signed)
I will place referral to OBGYN, as they would be doing the procedure.  Ezequiel Essex, MD

## 2021-11-25 NOTE — Telephone Encounter (Signed)
Patient calls nurse line checking on referral status. Referral has been placed and processed. Information given to patient.

## 2021-12-22 ENCOUNTER — Other Ambulatory Visit: Payer: Self-pay

## 2021-12-22 ENCOUNTER — Ambulatory Visit (INDEPENDENT_AMBULATORY_CARE_PROVIDER_SITE_OTHER): Payer: Medicaid Other

## 2021-12-22 DIAGNOSIS — Z111 Encounter for screening for respiratory tuberculosis: Secondary | ICD-10-CM

## 2021-12-23 NOTE — Progress Notes (Signed)
Patient is here for a PPD placement.  PPD placed in left forearm @ 3:00 pm.  Patient will return 12/24/2021 to have PPD read. Talbot Grumbling, RN

## 2021-12-24 ENCOUNTER — Other Ambulatory Visit: Payer: Self-pay

## 2021-12-24 ENCOUNTER — Ambulatory Visit (INDEPENDENT_AMBULATORY_CARE_PROVIDER_SITE_OTHER): Payer: Medicaid Other

## 2021-12-24 DIAGNOSIS — Z111 Encounter for screening for respiratory tuberculosis: Secondary | ICD-10-CM

## 2021-12-24 LAB — TB SKIN TEST
Induration: 0 mm
TB Skin Test: NEGATIVE

## 2021-12-24 NOTE — Progress Notes (Signed)
Patient is here for a PPD read.  It was placed on 12/22/2021 in the left forearm @ 3:00 pm.    PPD RESULTS:  Result: negative Induration: 0 mm  Letter created and given to patient for documentation purposes. Talbot Grumbling, RN

## 2022-01-10 ENCOUNTER — Ambulatory Visit: Payer: Medicaid Other | Admitting: Podiatry

## 2022-01-10 DIAGNOSIS — Z794 Long term (current) use of insulin: Secondary | ICD-10-CM | POA: Insufficient documentation

## 2022-01-10 DIAGNOSIS — Z8673 Personal history of transient ischemic attack (TIA), and cerebral infarction without residual deficits: Secondary | ICD-10-CM | POA: Insufficient documentation

## 2022-01-24 ENCOUNTER — Ambulatory Visit: Payer: Medicaid Other | Admitting: Podiatry

## 2022-02-09 ENCOUNTER — Ambulatory Visit: Payer: Medicaid Other | Admitting: Dietician

## 2022-03-09 ENCOUNTER — Encounter: Payer: Self-pay | Admitting: Internal Medicine

## 2022-03-09 ENCOUNTER — Ambulatory Visit (INDEPENDENT_AMBULATORY_CARE_PROVIDER_SITE_OTHER): Payer: Medicaid Other | Admitting: Family Medicine

## 2022-03-09 ENCOUNTER — Encounter: Payer: Self-pay | Admitting: Family Medicine

## 2022-03-09 VITALS — BP 172/110 | HR 78 | Ht 72.0 in | Wt 264.4 lb

## 2022-03-09 DIAGNOSIS — E119 Type 2 diabetes mellitus without complications: Secondary | ICD-10-CM | POA: Diagnosis not present

## 2022-03-09 DIAGNOSIS — I10 Essential (primary) hypertension: Secondary | ICD-10-CM

## 2022-03-09 DIAGNOSIS — N926 Irregular menstruation, unspecified: Secondary | ICD-10-CM

## 2022-03-09 LAB — POCT URINALYSIS DIP (MANUAL ENTRY)
Bilirubin, UA: NEGATIVE
Blood, UA: NEGATIVE
Glucose, UA: NEGATIVE mg/dL
Ketones, POC UA: NEGATIVE mg/dL
Leukocytes, UA: NEGATIVE
Nitrite, UA: NEGATIVE
Protein Ur, POC: NEGATIVE mg/dL
Spec Grav, UA: 1.025 (ref 1.010–1.025)
Urobilinogen, UA: 0.2 E.U./dL
pH, UA: 5.5 (ref 5.0–8.0)

## 2022-03-09 LAB — POCT GLYCOSYLATED HEMOGLOBIN (HGB A1C): HbA1c, POC (controlled diabetic range): 6.6 % (ref 0.0–7.0)

## 2022-03-09 LAB — POCT UA - MICROALBUMIN
Albumin/Creatinine Ratio, Urine, POC: <30
Creatinine, POC: 100 mg/dL
Microalbumin Ur, POC: 30 mg/L

## 2022-03-09 LAB — POCT URINE PREGNANCY: Preg Test, Ur: NEGATIVE

## 2022-03-09 MED ORDER — LOSARTAN POTASSIUM 50 MG PO TABS: 50.0000 mg | ORAL_TABLET | Freq: Every day | ORAL | 11 refills | Status: DC

## 2022-03-09 NOTE — Progress Notes (Signed)
? ? ?  SUBJECTIVE:  ? ?CHIEF COMPLAINT / HPI:  ? ?Missed period ?LMP at the end of February, was normal for her ?None since, about one week over due ?Normally monthly ?Denies abnormal cramping, pain, vaginal discharge/odor, vag/vulvar pain ?No changes in sexual partners or practices ?Does not know family or maternal history of early/late menopause ?Sexually active, not on birth control, does not desire pregnancy ?Previously had IUD, but had it taken out, made periods heavier, painful ? ?Diabetes ?Had eye exam in last year ?[facility]  ?Current regimen atorvastatin 80 mg, Zetia 10 mg, metformin XR 1000 mg twice daily ? ?Lab Results  ?Component Value Date  ? HGBA1C 6.6 03/09/2022  ? HGBA1C 6.3 05/07/2021  ? HGBA1C 13.5 (H) 07/21/2020  ? ?Lab Results  ?Component Value Date  ? MICROALBUR 30 03/09/2022  ? LDLCALC 192 (H) 07/21/2020  ? CREATININE 0.90 10/25/2021  ? ?HTN ?Current regimen losartan 25 mg only ?Does not have a blood pressure cuff at home ?Denies headache, vision changes, focal neurological deficits ? ?BP Readings from Last 3 Encounters:  ?03/09/22 (!) 172/110  ?11/05/21 (!) 176/94  ?10/25/21 (!) 182/104  ?  ?PERTINENT  PMH / PSH: HTN, HLD, CVA, T2DM, TIA, morbid obesity ? ?OBJECTIVE:  ? ?BP (!) 172/110   Pulse 78   Ht 6' (1.829 m)   Wt 264 lb 6.4 oz (119.9 kg)   LMP 02/07/2022 (Approximate)   SpO2 99%   BMI 35.86 kg/m?   ? ?PHQ-9:  ? ?  03/09/2022  ? 10:59 AM 11/05/2021  ?  9:04 AM 05/07/2021  ? 10:44 AM  ?Depression screen PHQ 2/9  ?Decreased Interest 0 0 0  ?Down, Depressed, Hopeless 0 0 0  ?PHQ - 2 Score 0 0 0  ?Altered sleeping 0 1 0  ?Tired, decreased energy 0 0 0  ?Change in appetite 0 0 0  ?Feeling bad or failure about yourself  0 0 0  ?Trouble concentrating 0 0 0  ?Moving slowly or fidgety/restless 0 0 0  ?Suicidal thoughts 0 0 0  ?PHQ-9 Score 0 1 0  ?Difficult doing work/chores Not difficult at all Not difficult at all Not difficult at all  ?  ?Physical Exam ?General: Awake, alert,  oriented ?Cardiovascular: Regular rate and rhythm, S1 and S2 present, no murmurs auscultated ?Respiratory: Lung fields clear to auscultation bilaterally ? ?ASSESSMENT/PLAN:  ? ?Missed menses ?Acute, simply 1 week overdue.  Differential includes pregnancy, AUB, menopause. Urine pregnancy negative.  Patient declines testing for GC, chlamydia, trichomonas. Discussed with patient conservative management of watchful waiting given acute onset.  Return precautions discussed, see AVS for more. ? ?Type 2 diabetes mellitus ([place]) ?At goal and stable, A1c today 6.6 from 6.3 last June.  Regimen includes atorvastatin 80 mg, Zetia 10 mg, metformin XR 1000 mg twice daily.  Collected UA and urine micro, both unremarkable.  No changes at this time.  Next check 6 months. ? ?Essential hypertension ?Uncontrolled, BP today 160/101 72/110 on repeat manually despite patient report adherence to losartan 25 mg.  No red flags or acute neurodeficits.  Increase losartan from 25 mg to 50 mg with recheck in 1 week, scheduled before patient left today.  ED precautions given, see AVS for more.  Will likely need addition of thiazide like diuretic in near future. Would want to keep on ACE/ARB for renal protective effects.   ?  ? ? ?Ezequiel Essex, MD ?[place]  ?

## 2022-03-09 NOTE — Patient Instructions (Addendum)
It was wonderful to see you today. Thank you for allowing me to be a part of your care. Below is a short summary of what we discussed at your visit today: ? ?Missed period ?Today your pregnancy test was negative.  Lets give your menses some more time.  Your body may be establishing a new normal.  If you continue to not have a period for a month or 2, please come back and we can evaluate further. ? ?High blood pressure ?Today your blood pressure was quite high.  Since you have been taking your medications regularly, we need to adjust your medicines. ? ?Increase your losartan from 25 mg to 50 mg daily.  I have sent a new prescription for the 50 mg tablet to your pharmacy.  Before you pick that up, you may use your current pills, simply take 2 of the 25 mg losartan until you use up the old medication. ? ?Come back in one week for a blood pressure recheck. I have booked you with myself next week, details on the next page. If this day and time does not work for you, simply talk to the front desk to change the appointment.  ? ?Diabetes ?Today your A1c was 6.6, stable from 6.3 last summer.  This is great news!  Keep up the great work! ?I ran some urine studies to check on your kidney health with your diabetes.  Good news, your urine studies do not show any evidence of kidney damage. ? ?Birth control ?Since you are sexually active, not on birth control, and not yet through menopause you are still at risk for pregnancy.  I have provided some condoms.  I am here to help you with your reproductive goals, whether that is to have a baby or to avoid pregnancy.  If you would like an appointment to discuss birth control, please call our front desk and schedule at your convenience. ? ?Cooking and Nutrition Classes ?[employer] in [place] provides many classes at low or no cost to [employer], nutrition, and agriculture.  Their website offers a huge variety of information related to topics  such as gardening, nutrition, cooking, parenting, and health.  Also listed are classes and events, both online and in-person.  Check out their website here: [email or web address]  ? ?Please bring all of your medications to every appointment! ? ?If you have any questions or concerns, please do not hesitate to contact us via phone or MyChart message.  ? ?Ezequiel Essex, MD  ?

## 2022-03-10 ENCOUNTER — Encounter: Payer: Self-pay | Admitting: Family Medicine

## 2022-03-10 DIAGNOSIS — N926 Irregular menstruation, unspecified: Secondary | ICD-10-CM | POA: Insufficient documentation

## 2022-03-10 NOTE — Assessment & Plan Note (Addendum)
Uncontrolled, BP today 160/101 72/110 on repeat manually despite patient report adherence to losartan 25 mg.  No red flags or acute neurodeficits.  Increase losartan from 25 mg to 50 mg with recheck in 1 week, scheduled before patient left today.  ED precautions given, see AVS for more.  Will likely need addition of thiazide like diuretic in near future. Would want to keep on ACE/ARB for renal protective effects.   ?

## 2022-03-10 NOTE — Assessment & Plan Note (Signed)
Acute, simply 1 week overdue.  Differential includes pregnancy, AUB, menopause. Urine pregnancy negative.  Patient declines testing for GC, chlamydia, trichomonas. Discussed with patient conservative management of watchful waiting given acute onset.  Return precautions discussed, see AVS for more. ?

## 2022-03-10 NOTE — Assessment & Plan Note (Signed)
At goal and stable, A1c today 6.6 from 6.3 last June.  Regimen includes atorvastatin 80 mg, Zetia 10 mg, metformin XR 1000 mg twice daily.  Collected UA and urine micro, both unremarkable.  No changes at this time.  Next check 6 months. ?

## 2022-03-14 NOTE — Patient Instructions (Signed)
It was wonderful to see you today. Thank you for allowing me to be a part of your care. Below is a short summary of what we discussed at your visit today: ? ?Hypertension ?Today your blood pressure was much better controlled.  No changes to your blood pressure medicine at this time. ? ?Today we drew blood work to check on your kidneys with the increased losartan dose. If the results are normal, I will send you a letter or MyChart message. If the results are abnormal, I will give you a call.   ? ?Refills ?I have sent in the refills of your medications that you have requested.  They should be ready at your pharmacy soon.  The metformin requires a prior Auth, so we will get that taken care of as soon as we get the paperwork from your pharmacy. ? ?Cooking and Nutrition Classes ?[employer] in [place] provides many classes at low or no cost to [employer], nutrition, and agriculture.  Their website offers a huge variety of information related to topics such as gardening, nutrition, cooking, parenting, and health.  Also listed are classes and events, both online and in-person.  Check out their website here: [email or web address]  ? ?Food finder app ?Download the [employer] App or Call 211 to easily find food banks and pantries and other resources nearby.  ? ? ?Please bring all of your medications to every appointment! ? ?If you have any questions or concerns, please do not hesitate to contact us via phone or MyChart message.  ? ?Ezequiel Essex, MD  ?

## 2022-03-14 NOTE — Progress Notes (Signed)
    SUBJECTIVE:   CHIEF COMPLAINT / HPI:   HTN follow-up Last saw myself at [facility] 4/5, BP uncontrolled 172/110, patient told PCP she was taking losartan only and not amlodipine-benazepril. Under that assumption, I increased losartan from 25 to 50 mg and scheduled follow-up in 1 week.  At today's appointment, her blood pressure is much better controlled however she now tells me that she is only taking the amlodipine-benazepril and has not picked up losartan yet from the pharmacy.  She reports that at last appointment, her blood pressure was uncontrolled because she had not yet taken her blood pressure medication for that day.  She denies headaches, dizziness, orthostatic hypotension, neurologic deficits.  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 residual deficits 01/10/2022   Urinary tract infection without hematuria 10/19/2021   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 ([place])    Morbid obesity ([place]) 05/11/2020   Chronic back pain 10/31/2018   Right sciatic nerve pain 10/31/2018   Type 2 diabetes mellitus ([place]) 04/24/2015   Microalbuminuria due to type 2 diabetes mellitus ([place]) 03/25/2015   Hyperlipemia 03/09/2015   Tobacco use disorder 03/09/2015   Essential hypertension 11/15/2012   Gastroesophageal reflux disease 05/23/2012   Vitamin D deficiency 05/23/2012    OBJECTIVE:   BP 134/82   Pulse 82   Wt 256 lb (116.1 kg)   SpO2 99%   BMI 34.72 kg/m    PHQ-9:     03/18/2022    9:56 AM 03/09/2022   10:59 AM 11/05/2021    9:04 AM  Depression screen PHQ 2/9  Decreased Interest 0 0 0  Down, Depressed, Hopeless 0 0 0  PHQ - 2 Score 0 0 0   Altered sleeping 0 0 1  Tired, decreased energy 0 0 0  Change in appetite 0 0 0  Feeling bad or failure about yourself  0 0 0  Trouble concentrating 0 0 0  Moving slowly or fidgety/restless 0 0 0  Suicidal thoughts 0 0 0  PHQ-9 Score 0 0 1  Difficult doing work/chores Not difficult at all Not difficult at all Not difficult at all    Physical Exam General: Awake, alert, oriented Cardiovascular: Regular rate and rhythm, S1 and S2 present, no murmurs auscultated Respiratory: Lung fields clear to auscultation bilaterally  ASSESSMENT/PLAN:   Essential hypertension Better controlled today, BP in stage I hypertension.  Apparently, patient is only taking amlodipine-benazepril and has not picked up losartan from the pharmacy.  This is different from what I understood at her last visit.  Given acceptable control from amlodipine-benazepril, will make no changes to her regimen at this time.  We will collect BMP today. See AVS for more.   Hyperlipemia Refills sent per patient request.   Tobacco use disorder Refills sent per patient request, tolerated well without adverse side effects.    Ezequiel Essex, MD [place]

## 2022-03-17 ENCOUNTER — Ambulatory Visit (AMBULATORY_SURGERY_CENTER): Payer: Medicaid Other | Admitting: *Deleted

## 2022-03-17 VITALS — Ht 72.0 in | Wt 264.0 lb

## 2022-03-17 DIAGNOSIS — Z1211 Encounter for screening for malignant neoplasm of colon: Secondary | ICD-10-CM

## 2022-03-17 MED ORDER — PLENVU 140 G PO SOLR: 1.0000 | Freq: Once | ORAL | 0 refills | Status: AC

## 2022-03-17 NOTE — Progress Notes (Signed)
Virtual pre visit completed over telephone. ?Instructions sent to home address. ? ? ?No egg or soy allergy known to patient  ?No issues known to pt with past sedation with any surgeries or procedures ?Patient denies ever being told they had issues or difficulty with intubation  ?No FH of Malignant Hyperthermia ?Pt is not on diet pills ?Pt is not on  home 02  ?Pt is not on blood thinners  ?Pt denies issues with constipation  ?No A fib or A flutter ? ? ?Discussed with pt there will be an out-of-pocket cost for prep and that varies from $0 to 70 +  dollars - pt verbalized understanding  ? ?Due to the COVID-19 pandemic we are asking patients to follow certain guidelines in PV and the [place]   ?Pt aware of COVID protocols and [facility] guidelines  ? ?PV completed over the phone. Pt verified name, DOB, address and insurance during PV today.  ?Pt mailed instruction packet with copy of consent form to read and not return, and instructions.  ?Pt encouraged to call with questions or issues.  ?If pt has My chart, procedure instructions sent via My Chart   ?

## 2022-03-18 ENCOUNTER — Ambulatory Visit: Payer: Medicaid Other | Admitting: Family Medicine

## 2022-03-18 ENCOUNTER — Encounter: Payer: Self-pay | Admitting: Family Medicine

## 2022-03-18 VITALS — BP 134/82 | HR 82 | Wt 256.0 lb

## 2022-03-18 DIAGNOSIS — R809 Proteinuria, unspecified: Secondary | ICD-10-CM | POA: Diagnosis not present

## 2022-03-18 DIAGNOSIS — E119 Type 2 diabetes mellitus without complications: Secondary | ICD-10-CM | POA: Diagnosis not present

## 2022-03-18 DIAGNOSIS — I10 Essential (primary) hypertension: Secondary | ICD-10-CM

## 2022-03-18 DIAGNOSIS — E1129 Type 2 diabetes mellitus with other diabetic kidney complication: Secondary | ICD-10-CM | POA: Diagnosis not present

## 2022-03-18 DIAGNOSIS — F17200 Nicotine dependence, unspecified, uncomplicated: Secondary | ICD-10-CM

## 2022-03-18 DIAGNOSIS — E785 Hyperlipidemia, unspecified: Secondary | ICD-10-CM

## 2022-03-18 MED ORDER — AMLODIPINE BESY-BENAZEPRIL HCL 10-40 MG PO CAPS: ORAL_CAPSULE | ORAL | 3 refills | Status: AC

## 2022-03-18 MED ORDER — ATORVASTATIN CALCIUM 80 MG PO TABS: ORAL_TABLET | ORAL | 3 refills | Status: AC

## 2022-03-18 MED ORDER — METFORMIN HCL ER (MOD) 1000 MG PO TB24: 1000.0000 mg | ORAL_TABLET | Freq: Two times a day (BID) | ORAL | 3 refills | Status: AC

## 2022-03-18 MED ORDER — EZETIMIBE 10 MG PO TABS: 10.0000 mg | ORAL_TABLET | Freq: Every day | ORAL | 3 refills | Status: AC

## 2022-03-19 LAB — BASIC METABOLIC PANEL
BUN/Creatinine Ratio: 13 (ref 9–23)
BUN: 12 mg/dL (ref 6–24)
CO2: 20 mmol/L (ref 20–29)
Calcium: 9.5 mg/dL (ref 8.7–10.2)
Chloride: 107 mmol/L — ABNORMAL HIGH (ref 96–106)
Creatinine, Ser: 0.93 mg/dL (ref 0.57–1.00)
Glucose: 71 mg/dL (ref 70–99)
Potassium: 4.7 mmol/L (ref 3.5–5.2)
Sodium: 143 mmol/L (ref 134–144)
eGFR: 77 mL/min/{1.73_m2} (ref >59)

## 2022-03-23 NOTE — Assessment & Plan Note (Signed)
Refills sent per patient request.

## 2022-03-23 NOTE — Assessment & Plan Note (Signed)
Better controlled today, BP in stage I hypertension.  Apparently, patient is only taking amlodipine-benazepril and has not picked up losartan from the pharmacy.  This is different from what I understood at her last visit.  Given acceptable control from amlodipine-benazepril, will make no changes to her regimen at this time.  We will collect BMP today. See AVS for more.  ?

## 2022-03-23 NOTE — Assessment & Plan Note (Signed)
Refills sent per patient request, tolerated well without adverse side effects. ?

## 2022-03-29 ENCOUNTER — Encounter: Payer: Self-pay | Admitting: Family Medicine

## 2022-03-30 ENCOUNTER — Encounter: Payer: Self-pay | Admitting: Certified Registered Nurse Anesthetist

## 2022-04-05 ENCOUNTER — Telehealth: Payer: Self-pay | Admitting: Internal Medicine

## 2022-04-05 DIAGNOSIS — Z1211 Encounter for screening for malignant neoplasm of colon: Secondary | ICD-10-CM

## 2022-04-05 MED ORDER — PLENVU 140 G PO SOLR: 1.0000 | Freq: Once | ORAL | 0 refills | Status: AC

## 2022-04-05 NOTE — Telephone Encounter (Signed)
Patient called, states procedure is scheduled 04/07/22. Per patient, she drank first bottle of prep Saturday, April 29th. Can we resend prep to pharmacy so patient can prep properly on 04/06/22. Please advise. ?

## 2022-04-05 NOTE — Telephone Encounter (Signed)
Resent Plenvu Rx to pt's pharmacy.  ?

## 2022-04-05 NOTE — Telephone Encounter (Signed)
Called patient, no answer. Left a message to let her know RX sent and to call us back with any prep questions.  ?

## 2022-04-07 ENCOUNTER — Encounter: Payer: Self-pay | Admitting: Internal Medicine

## 2022-04-07 ENCOUNTER — Ambulatory Visit (AMBULATORY_SURGERY_CENTER): Payer: Medicaid Other | Admitting: Internal Medicine

## 2022-04-07 VITALS — BP 134/80 | HR 69 | Temp 97.5°F | Resp 11 | Ht 72.0 in | Wt 264.0 lb

## 2022-04-07 DIAGNOSIS — D125 Benign neoplasm of sigmoid colon: Secondary | ICD-10-CM

## 2022-04-07 DIAGNOSIS — Z1211 Encounter for screening for malignant neoplasm of colon: Secondary | ICD-10-CM | POA: Diagnosis not present

## 2022-04-07 DIAGNOSIS — K635 Polyp of colon: Secondary | ICD-10-CM | POA: Diagnosis not present

## 2022-04-07 DIAGNOSIS — D120 Benign neoplasm of cecum: Secondary | ICD-10-CM

## 2022-04-07 MED ORDER — SODIUM CHLORIDE 0.9 % IV SOLN: 500.0000 mL | Freq: Once | INTRAVENOUS | Status: DC

## 2022-04-07 NOTE — Progress Notes (Signed)
Report given to PACU, vss 

## 2022-04-07 NOTE — Patient Instructions (Signed)
Handouts provided on polyps and hemorrhoids.   YOU HAD AN ENDOSCOPIC PROCEDURE TODAY AT THE [facility]:   Refer to the procedure report that was given to you for any specific questions about what was found during the examination.  If the procedure report does not answer your questions, please call your gastroenterologist to clarify.  If you requested that your care partner not be given the details of your procedure findings, then the procedure report has been included in a sealed envelope for you to review at your convenience later.  YOU SHOULD EXPECT: Some feelings of bloating in the abdomen. Passage of more gas than usual.  Walking can help get rid of the air that was put into your GI tract during the procedure and reduce the bloating. If you had a lower endoscopy (such as a colonoscopy or flexible sigmoidoscopy) you may notice spotting of blood in your stool or on the toilet paper. If you underwent a bowel prep for your procedure, you may not have a normal bowel movement for a few days.  Please Note:  You might notice some irritation and congestion in your nose or some drainage.  This is from the oxygen used during your procedure.  There is no need for concern and it should clear up in a day or so.  SYMPTOMS TO REPORT IMMEDIATELY:  Following lower endoscopy (colonoscopy or flexible sigmoidoscopy):  Excessive amounts of blood in the stool  Significant tenderness or worsening of abdominal pains  Swelling of the abdomen that is new, acute  Fever of 100F or higher  For urgent or emergent issues, a gastroenterologist can be reached at any hour by calling [phone]. Do not use MyChart messaging for urgent concerns.    DIET:  We do recommend a small meal at first, but then you may proceed to your regular diet.  Drink plenty of fluids but you should avoid alcoholic beverages for 24 hours.  ACTIVITY:  You should plan to take it easy for the rest of today and you should NOT DRIVE  or use heavy machinery until tomorrow (because of the sedation medicines used during the test).    FOLLOW UP: Our staff will call the number listed on your records 48-72 hours following your procedure to check on you and address any questions or concerns that you may have regarding the information given to you following your procedure. If we do not reach you, we will leave a message.  We will attempt to reach you two times.  During this call, we will ask if you have developed any symptoms of COVID 19. If you develop any symptoms (ie: fever, flu-like symptoms, shortness of breath, cough etc.) before then, please call [phone].  If you test positive for Covid 19 in the 2 weeks post procedure, please call and report this information to us.    If any biopsies were taken you will be contacted by phone or by letter within the next 1-3 weeks.  Please call us at [phone] if you have not heard about the biopsies in 3 weeks.    SIGNATURES/CONFIDENTIALITY: You and/or your care partner have signed paperwork which will be entered into your electronic medical record.  These signatures attest to the fact that that the information above on your After Visit Summary has been reviewed and is understood.  Full responsibility of the confidentiality of this discharge information lies with you and/or your care-partner.  

## 2022-04-07 NOTE — Progress Notes (Signed)
Called to room to assist during endoscopic procedure.  Patient ID and intended procedure confirmed with present staff. Received instructions for my participation in the procedure from the performing physician.  

## 2022-04-07 NOTE — Op Note (Signed)
[facility] ?Patient Name: Darlene Bennett ?Procedure Date: 04/07/2022 9:06 AM ?MRN: [id number] ?Endoscopist: Sonny Masters "Christia Reading ,  ?Age: 46 ?Referring MD:  ?Date of Birth: 02-Jul-1976 ?Gender: Female ?Account #: [id number] ?Procedure:                Colonoscopy ?Indications:              Screening for colorectal malignant neoplasm, This  ?                          is the patient's first colonoscopy ?Medicines:                Monitored Anesthesia Care ?Procedure:                Pre-Anesthesia Assessment: ?                          - Prior to the procedure, a History and Physical  ?                          was performed, and patient medications and  ?                          allergies were reviewed. The patient's tolerance of  ?                          previous anesthesia was also reviewed. The risks  ?                          and benefits of the procedure and the sedation  ?                          options and risks were discussed with the patient.  ?                          All questions were answered, and informed consent  ?                          was obtained. Prior Anticoagulants: The patient has  ?                          taken no previous anticoagulant or antiplatelet  ?                          agents. ASA Grade Assessment: II - A patient with  ?                          mild systemic disease. After reviewing the risks  ?                          and benefits, the patient was deemed in  ?                          satisfactory condition to undergo the procedure. ?  After obtaining informed consent, the colonoscope  ?                          was passed under direct vision. Throughout the  ?                          procedure, the patient's blood pressure, pulse, and  ?                          oxygen saturations were monitored continuously. The  ?                          CF [id number] #[id number] was introduced through the anus  ?                          and advanced to the the  terminal ileum. The  ?                          colonoscopy was performed without difficulty. The  ?                          patient tolerated the procedure well. The quality  ?                          of the bowel preparation was good. The terminal  ?                          ileum, ileocecal valve, appendiceal orifice, and  ?                          rectum were photographed. ?Scope In: 9:18:20 AM ?Scope Out: 9:36:10 AM ?Scope Withdrawal Time: 0 hours 15 minutes 5 seconds  ?Total Procedure Duration: 0 hours 17 minutes 50 seconds  ?Findings:                 The terminal ileum appeared normal. ?                          A 2 mm polyp was found in the cecum. The polyp was  ?                          sessile. The polyp was removed with a cold biopsy  ?                          forceps. Resection and retrieval were complete. ?                          A 8 mm polyp was found in the sigmoid colon. The  ?                          polyp was sessile. The polyp was removed with a  ?  cold snare. Resection and retrieval were complete. ?                          Non-bleeding internal hemorrhoids were found during  ?                          retroflexion. ?Complications:            No immediate complications. ?Estimated Blood Loss:     Estimated blood loss was minimal. ?Impression:               - The examined portion of the ileum was normal. ?                          - One 2 mm polyp in the cecum, removed with a cold  ?                          biopsy forceps. Resected and retrieved. ?                          - One 8 mm polyp in the sigmoid colon, removed with  ?                          a cold snare. Resected and retrieved. ?                          - Non-bleeding internal hemorrhoids. ?Recommendation:           - Discharge patient to home (with escort). ?                          - Await pathology results. ?                          - The findings and recommendations were discussed  ?                           with the patient. ?Georgian Co,  ?04/07/2022 9:41:34 AM ?

## 2022-04-07 NOTE — Progress Notes (Signed)
I have reviewed the patient's medical history in detail and updated the computerized patient record.   VS BY CW. 

## 2022-04-07 NOTE — Progress Notes (Signed)
? ?GASTROENTEROLOGY PROCEDURE H&P NOTE  ? ?Primary Care Physician: ?Ezequiel Essex, MD ? ? ? ?Reason for Procedure:   Colon cancer screening ? ?Plan:    Colonoscopy ? ?Patient is appropriate for endoscopic procedure(s) in the ambulatory ([facility]) setting. ? ?The nature of the procedure, as well as the risks, benefits, and alternatives were carefully and thoroughly reviewed with the patient. Ample time for discussion and questions allowed. The patient understood, was satisfied, and agreed to proceed.  ? ? ? ?HPI: ?Makenley Faelyn Sigler is a 46 y.o. female who presents for colonoscopy for colon cancer screening. Denies blood in stools, changes in bowel habits, weight loss. Denies fam hx of colon cancer. ? ?Past Medical History:  ?Diagnosis Date  ? Blister of breast with infection, left, initial encounter 07/09/2020  ? Contraceptive management 05/25/2015  ? S/p BTL 2007 but got pregnant. Then received depo for a while. Copper IUD placed 08/17/2020  ? Encounter for insertion of copper intrauterine contraceptive device (IUD) 08/16/2020  ? Epidermoid cyst 08/04/2016  ? Gestational diabetes   ? insulin  ? Gestational diabetes mellitus, antepartum 02/14/2013  ? History of gestational diabetes   ? Hyperglycemia   ? Hypertension   ? preeclampsia with previous pregnancy  ? Knee pain, right 09/16/2020  ? Scalp cyst   ? Slurred speech   ? Stroke [facility])   ? 8/21, speech difficulties  ? ? ?Past Surgical History:  ?Procedure Laterality Date  ? CYST EXCISION N/A 09/15/2016  ? Procedure: EXCISION POSTERIOR SCALP CYST;  Surgeon: Clovis Riley, MD;  Location: [facility];  Service: General;  Laterality: N/A;  ? TUBAL LIGATION    ? 2007  ? ? ?Prior to Admission medications   ?Medication Sig Start Date End Date Taking? Authorizing Provider  ?amLODipine-benazepril (LOTREL) 10-40 MG capsule 1 cap(s) orally once a day for 90 days 03/18/22  Yes Ezequiel Essex, MD  ?aspirin EC 81 MG EC tablet Take 1 tablet (81 mg total) by mouth daily. Swallow whole.  07/23/20  Yes Milus Banister C, DO  ?atorvastatin (LIPITOR) 80 MG tablet TAKE 1 TABLET(80 MG) BY MOUTH DAILY 03/18/22  Yes Ezequiel Essex, MD  ?metFORMIN (GLUMETZA) 1000 MG (MOD) 24 hr tablet Take 1 tablet (1,000 mg total) by mouth 2 (two) times daily. 03/18/22  Yes Ezequiel Essex, MD  ?Accu-Chek Softclix Lancets lancets USE AS DIRECTED TO TEST BLOOD SUGAR FOUR TIMES DAILY 09/28/20   Lattie Haw, MD  ?blood glucose meter kit and supplies KIT Dispense based on patient and insurance preference. Use up to four times daily as directed. (FOR ICD-9 250.00, 250.01). 07/24/20   Ezequiel Essex, MD  ?Blood Glucose Monitoring Suppl (ACCU-CHEK NANO SMARTVIEW) W/DEVICE KIT 1 Device by Does not apply route 4 (four) times daily -  before meals and at bedtime. 03/09/15   Katheren Shams, DO  ?diclofenac (VOLTAREN) 75 MG EC tablet Take 1 tablet (75 mg total) by mouth 2 (two) times daily. ?Patient not taking: Reported on 03/17/2022 07/29/21   Kathrynn Ducking, MD  ?ezetimibe (ZETIA) 10 MG tablet Take 1 tablet (10 mg total) by mouth daily. ?Patient not taking: Reported on 04/07/2022 03/18/22   Ezequiel Essex, MD  ?glucose blood test strip Use to check blood sugar once daily or as directed. 05/14/20   Lattie Haw, MD  ?Insulin Syringe/Needle U-500 (BD INSULIN SYRINGE U-500) 31G X 6MM 0.5 ML MISC 1 Units by Does not apply route daily. ?Patient not taking: Reported on 03/17/2022 11/02/20   Lattie Haw, MD  ?  LANTUS SOLOSTAR 100 UNIT/ML Solostar Pen INJECT 20 UNITS INTO THE SKIN EVERY DAY ?Patient not taking: Reported on 03/17/2022 11/16/20   Lattie Haw, MD  ?lidocaine (LIDODERM) 5 % Place 1 patch onto the skin daily. Remove & Discard patch within 12 hours or as directed by MD ?Patient not taking: Reported on 03/17/2022 09/21/21   Emeline Darling, PA-C  ? ? ?Current Outpatient Medications  ?Medication Sig Dispense Refill  ? amLODipine-benazepril (LOTREL) 10-40 MG capsule 1 cap(s) orally once a day for 90 days 90 capsule 3  ? aspirin  EC 81 MG EC tablet Take 1 tablet (81 mg total) by mouth daily. Swallow whole. 90 tablet 1  ? atorvastatin (LIPITOR) 80 MG tablet TAKE 1 TABLET(80 MG) BY MOUTH DAILY 90 tablet 3  ? metFORMIN (GLUMETZA) 1000 MG (MOD) 24 hr tablet Take 1 tablet (1,000 mg total) by mouth 2 (two) times daily. 180 tablet 3  ? Accu-Chek Softclix Lancets lancets USE AS DIRECTED TO TEST BLOOD SUGAR FOUR TIMES DAILY 100 each 3  ? blood glucose meter kit and supplies KIT Dispense based on patient and insurance preference. Use up to four times daily as directed. (FOR ICD-9 250.00, 250.01). 1 each 0  ? Blood Glucose Monitoring Suppl (ACCU-CHEK NANO SMARTVIEW) W/DEVICE KIT 1 Device by Does not apply route 4 (four) times daily -  before meals and at bedtime. 1 kit 0  ? diclofenac (VOLTAREN) 75 MG EC tablet Take 1 tablet (75 mg total) by mouth 2 (two) times daily. (Patient not taking: Reported on 03/17/2022) 60 tablet 1  ? ezetimibe (ZETIA) 10 MG tablet Take 1 tablet (10 mg total) by mouth daily. (Patient not taking: Reported on 04/07/2022) 90 tablet 3  ? glucose blood test strip Use to check blood sugar once daily or as directed. 30 each 5  ? Insulin Syringe/Needle U-500 (BD INSULIN SYRINGE U-500) 31G X 6MM 0.5 ML MISC 1 Units by Does not apply route daily. (Patient not taking: Reported on 03/17/2022) 90 each 2  ? LANTUS SOLOSTAR 100 UNIT/ML Solostar Pen INJECT 20 UNITS INTO THE SKIN EVERY DAY (Patient not taking: Reported on 03/17/2022) 15 mL 1  ? lidocaine (LIDODERM) 5 % Place 1 patch onto the skin daily. Remove & Discard patch within 12 hours or as directed by MD (Patient not taking: Reported on 03/17/2022) 30 patch 0  ? ?Current Facility-Administered Medications  ?Medication Dose Route Frequency Provider Last Rate Last Admin  ? 0.9 %  sodium chloride infusion  500 mL Intravenous Once Sharyn Creamer, MD      ? ? ?Allergies as of 04/07/2022  ? (No Known Allergies)  ? ? ?Family History  ?Problem Relation Age of Onset  ? Heart disease Mother   ?  Hypertension Mother   ? Diabetes Mother   ? Hypertension Father   ? Diabetes Father   ? Colon cancer Neg Hx   ? Esophageal cancer Neg Hx   ? Rectal cancer Neg Hx   ? Stomach cancer Neg Hx   ? ? ?Social History  ? ?Socioeconomic History  ? Marital status: Married  ?  Spouse name: Nicole Kindred  ? Number of children: 4  ? Years of education: Not on file  ? Highest education level: Not on file  ?Occupational History  ? 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 Smoking status: Some Days  ?  Packs/day: 0.25  ?  Years: 22.00  ?  Pack years: 5.50  ?  Types: Cigarettes  ?  Last attempt to quit: 05/2021  ?  Years since quitting: 0.9  ? Smokeless tobacco: Never  ? Tobacco comments:  ?  working on it now; has cut back from 0.5, quit 05/2021  ?Vaping Use  ? Vaping Use: Never used  ?Substance and Sexual Activity  ? Alcohol use: No  ?  Alcohol/week: 0.0 standard drinks  ? Drug use: No  ? Sexual activity: Yes  ?  Birth control/protection: Injection  ?Other Topics Concern  ? Not on file  ?Social History Narrative  ? Lives with husband and 2 kids  ? Left handed  ? Drinks 1 cup of caffeine daily  ? ?Social Determinants of Health  ? ?Financial Resource Strain: Not on file  ?Food Insecurity: Not on file  ?Transportation Needs: Not on file  ?Physical Activity: Not on file  ?Stress: Not on file  ?Social Connections: Not on file  ?Intimate Partner Violence: Not on file  ? ? ?Physical Exam: ?Vital signs in last 24 hours: ?BP (!) 157/98   Pulse 76   Temp (!) 97.5 ?F (36.4 ?C) (Temporal)   Ht 6' (1.829 m)   Wt 264 lb (119.7 kg)   SpO2 100%   BMI 35.80 kg/m?  ?GEN: NAD ?EYE: Sclerae anicteric ?ENT: MMM ?CV: Non-tachycardic ?Pulm: No increased work of breathing ?GI: Soft, NT/ND ?NEURO:  Alert & Oriented ? ? ?Christia Reading, MD ?[facility] ? ?04/07/2022 8:30 AM ? ?

## 2022-04-11 ENCOUNTER — Telehealth: Payer: Self-pay

## 2022-04-11 NOTE — Telephone Encounter (Signed)
?  Follow up Call- ? ? ?  04/07/2022  ?  8:20 AM  ?Call back number  ?Post procedure Call Back phone  # [phone]  ?Permission to leave phone message Yes  ?  ? ?Patient questions: ? ?Do you have a fever, pain , or abdominal swelling? No. ?Pain Score  0 * ? ?Have you tolerated food without any problems? Yes.   ? ?Have you been able to return to your normal activities? Yes.   ? ?Do you have any questions about your discharge instructions: ?Diet   No. ?Medications  No. ?Follow up visit  No. ? ?Do you have questions or concerns about your Care? No. ? ?Actions: ?* If pain score is 4 or above: ?No action needed, pain <4. ? ? ?

## 2022-04-12 ENCOUNTER — Encounter: Payer: Self-pay | Admitting: Internal Medicine

## 2022-04-29 ENCOUNTER — Telehealth: Payer: Self-pay | Admitting: Internal Medicine

## 2022-04-29 NOTE — Telephone Encounter (Signed)
Called pt and confirmed her mailing address. States she rarely checks her mail. Provided her with the contents of the letter mailed on 04/12/22. Verbalized acceptance and understanding. Asked if she had an alternate means of communication of mailbox is rarely checked and she does not have My Chart set up. Pt states she will try to do a better job checking her mail.

## 2022-04-29 NOTE — Telephone Encounter (Signed)
Patient called said she has not heard from anyone about her path results.

## 2022-04-29 NOTE — Telephone Encounter (Signed)
Following path letter mailed 04/12/22  04/12/2022 MRN: [id number]     Darlene Bennett [address] [name] [address]   Dear Ms. [name],   I am writing to inform you that the polyps removed from your colon were NOT pre-cancerous.    I recommend that you have a repeat colonoscopy exam in 10 years for routine colorectal screening.    Should you develop new or worsening symptoms of abdominal pain, bowel habit changes, or bleeding form the rectum or bowels, please schedule an evaluation either with me or your primary care physician.    Please call us if you have persistent problems or have questions about your condition that have not been fully answered at this time.   Sincerely,    Sharyn Creamer, MD

## 2022-05-24 ENCOUNTER — Encounter: Payer: Self-pay | Admitting: Family Medicine

## 2022-05-24 ENCOUNTER — Ambulatory Visit (INDEPENDENT_AMBULATORY_CARE_PROVIDER_SITE_OTHER): Payer: Medicaid Other | Admitting: Family Medicine

## 2022-05-24 VITALS — BP 136/84 | HR 78 | Wt 270.0 lb

## 2022-05-24 DIAGNOSIS — N912 Amenorrhea, unspecified: Secondary | ICD-10-CM | POA: Diagnosis present

## 2022-05-24 LAB — POCT URINE PREGNANCY: Preg Test, Ur: NEGATIVE

## 2022-05-24 NOTE — Progress Notes (Signed)
    SUBJECTIVE:   CHIEF COMPLAINT / HPI:   Patient presents due to abnormal menses concern for possible pregnancy.  She notes that she is sexually active and her menstrual cycle is currently late.  She took a home pregnancy test that was indeterminant (faintly positive) and wanted further testing. She does note that she has had a little bit of abnormality in her menstrual cycles recently.  She does not want to be on any birth control at this time and recently had her IUD removed.  PERTINENT  PMH / PSH: Reviewed  OBJECTIVE:   BP 136/84   Pulse 78   Wt 270 lb (122.5 kg)   LMP 04/18/2022   SpO2 99%   BMI 36.62 kg/m   General: NAD, well-appearing, well-nourished Respiratory: No respiratory distress, breathing comfortably, able to speak in full sentences Skin: warm and dry, no rashes noted on exposed skin Psych: Appropriate affect and mood  ASSESSMENT/PLAN:   Abnormal menses Pregnancy test was negative today.  High consideration that patient may be approaching the peri-menopausal period and may have abnormal menstrual cycles as time progresses.  Given lack of heavy bleeding or spotting, do not feel that further imaging such as pelvic ultrasound is necessary at this time. - Continue to monitor menstrual cycles - Educated patient about perimenopausal period - Recommended birth control, patient declined - Cautioned patient about unprotected intercourse as she is still able to bear children at this time   Rise Patience, [place]

## 2022-05-24 NOTE — Patient Instructions (Signed)
Your pregnancy test was negative today.   It would not be unsurprising if over the next few years your periods become a little bit abnormal but we will want to keep an eye on it to make sure there is nothing concerning. Make sure to keep up with getting your pap smears and mammograms.

## 2023-02-23 DIAGNOSIS — E782 Mixed hyperlipidemia: Secondary | ICD-10-CM | POA: Diagnosis not present

## 2023-02-23 DIAGNOSIS — N179 Acute kidney failure, unspecified: Secondary | ICD-10-CM | POA: Diagnosis not present

## 2023-02-23 DIAGNOSIS — I10 Essential (primary) hypertension: Secondary | ICD-10-CM | POA: Diagnosis not present

## 2023-02-23 DIAGNOSIS — E1165 Type 2 diabetes mellitus with hyperglycemia: Secondary | ICD-10-CM | POA: Diagnosis not present

## 2023-03-11 ENCOUNTER — Emergency Department (HOSPITAL_COMMUNITY): Payer: Medicaid Other

## 2023-03-11 ENCOUNTER — Other Ambulatory Visit: Payer: Self-pay

## 2023-03-11 ENCOUNTER — Emergency Department (HOSPITAL_COMMUNITY)
Admission: EM | Admit: 2023-03-11 | Discharge: 2023-03-11 | Disposition: A | Discharge status: Home / Self Care | Payer: Medicaid Other | Attending: Emergency Medicine | Admitting: Emergency Medicine

## 2023-03-11 ENCOUNTER — Encounter (HOSPITAL_COMMUNITY): Payer: Self-pay

## 2023-03-11 DIAGNOSIS — R079 Chest pain, unspecified: Secondary | ICD-10-CM | POA: Diagnosis not present

## 2023-03-11 DIAGNOSIS — Z7982 Long term (current) use of aspirin: Secondary | ICD-10-CM | POA: Insufficient documentation

## 2023-03-11 DIAGNOSIS — M25511 Pain in right shoulder: Secondary | ICD-10-CM | POA: Insufficient documentation

## 2023-03-11 DIAGNOSIS — Z794 Long term (current) use of insulin: Secondary | ICD-10-CM | POA: Diagnosis not present

## 2023-03-11 DIAGNOSIS — E119 Type 2 diabetes mellitus without complications: Secondary | ICD-10-CM | POA: Insufficient documentation

## 2023-03-11 DIAGNOSIS — Z720 Tobacco use: Secondary | ICD-10-CM | POA: Diagnosis not present

## 2023-03-11 DIAGNOSIS — I10 Essential (primary) hypertension: Secondary | ICD-10-CM | POA: Diagnosis not present

## 2023-03-11 DIAGNOSIS — Z79899 Other long term (current) drug therapy: Secondary | ICD-10-CM | POA: Insufficient documentation

## 2023-03-11 DIAGNOSIS — Z7984 Long term (current) use of oral hypoglycemic drugs: Secondary | ICD-10-CM | POA: Insufficient documentation

## 2023-03-11 MED ORDER — TIZANIDINE HCL 4 MG PO TABS: 4.0000 mg | ORAL_TABLET | Freq: Four times a day (QID) | ORAL | 0 refills | Status: DC | PRN

## 2023-03-11 MED ORDER — BENAZEPRIL HCL 20 MG PO TABS
40.0000 mg | ORAL_TABLET | Freq: Every day | ORAL | Status: DC
  Administered 2023-03-11: 40 mg via ORAL
  Filled 2023-03-11: qty 2

## 2023-03-11 MED ORDER — IBUPROFEN 400 MG PO TABS
600.0000 mg | ORAL_TABLET | Freq: Once | ORAL | Status: AC
  Administered 2023-03-11: 600 mg via ORAL
  Filled 2023-03-11: qty 1

## 2023-03-11 MED ORDER — LIDOCAINE 5 % EX PTCH
1.0000 | MEDICATED_PATCH | CUTANEOUS | Status: DC
  Administered 2023-03-11: 1 via TRANSDERMAL
  Filled 2023-03-11: qty 1

## 2023-03-11 MED ORDER — AMLODIPINE BESYLATE 5 MG PO TABS
10.0000 mg | ORAL_TABLET | Freq: Once | ORAL | Status: AC
  Administered 2023-03-11: 10 mg via ORAL
  Filled 2023-03-11: qty 2

## 2023-03-11 NOTE — Discharge Instructions (Signed)
You were seen in the ER today for evaluation of your right shoulder pain. Your x-rays were unremarkable. You likely have a muscle spasm in your right shoulder. I'd like for you to follow up with your PCP and neurologist about this. I recommend 1000mg  of Tylenol and 600mg  of ibuprofen every 6 hours as needed for pain.  Additionally, sending home with some muscle relaxers for you to take as needed.  Please be cautious with operating heavy machinery or driving while this medication can make you very sleepy.  You can also try a heating pads or ice or topical lidocaine patches.  I would try gentle stretching like the ones shown to you.  If you have any concerns, new or worsening symptoms, please return to the nearest emergency department for evaluation.  Contact a doctor if: Your pain gets worse. Medicine does not help your pain. You have new pain in your arm, hand, or fingers. You loosen your sling and your arm, hand, or fingers: Tingle. Are numb. Are swollen. Get help right away if: Your arm, hand, or fingers turn white or blue.

## 2023-03-11 NOTE — ED Notes (Signed)
Patient transported to X-ray 

## 2023-03-11 NOTE — ED Provider Notes (Signed)
[place] EMERGENCY DEPARTMENT AT [facility] Provider Note   CSN: [id number] Arrival date & time: 03/11/23  1012     History Chief Complaint  Patient presents with   Arm Pain    Darlene Bennett is a 47 y.o. female with h/o HTN and DM presents to the ER for evaluation of right shoulder pair for the past week. She denies any injury. She reports it hurts more with laying on her right side, with lifting her arm up, and with coughing. She denies any numbness or tingling to the area. She denies any heavy lifting. She denies any runny nose, nasal congestion, chest pain, SOB, abdominal pain, NVD. She has had this pain previously in the left shoulder and reports it feels very similar. She has not tried any pain medication or tried other interventions. Additionally, she reports that she did not take her BP medication this AM. NKDA. Daily tobacco use.    Arm Pain Pertinent negatives include no chest pain, no abdominal pain and no shortness of breath.       Home Medications Prior to Admission medications   Medication Sig Start Date End Date Taking? Authorizing Provider  Accu-Chek Softclix Lancets lancets USE AS DIRECTED TO TEST BLOOD SUGAR FOUR TIMES DAILY 09/28/20   Sherlyn Lick, MD  amLODipine-benazepril (LOTREL) 10-40 MG capsule 1 cap(s) orally once a day for 90 days 03/18/22   Fayette Pho, MD  aspirin EC 81 MG EC tablet Take 1 tablet (81 mg total) by mouth daily. Swallow whole. 07/23/20   Dollene Cleveland, DO  atorvastatin (LIPITOR) 80 MG tablet TAKE 1 TABLET(80 MG) BY MOUTH DAILY 03/18/22   Fayette Pho, MD  blood glucose meter kit and supplies KIT Dispense based on patient and insurance preference. Use up to four times daily as directed. (FOR ICD-9 250.00, 250.01). 07/24/20   Fayette Pho, MD  Blood Glucose Monitoring Suppl (ACCU-CHEK NANO SMARTVIEW) W/DEVICE KIT 1 Device by Does not apply route 4 (four) times daily -  before meals and at bedtime. 03/09/15    Pincus Large, DO  diclofenac (VOLTAREN) 75 MG EC tablet Take 1 tablet (75 mg total) by mouth 2 (two) times daily. Patient not taking: Reported on 03/17/2022 07/29/21   York Spaniel, MD  ezetimibe (ZETIA) 10 MG tablet Take 1 tablet (10 mg total) by mouth daily. Patient not taking: Reported on 04/07/2022 03/18/22   Fayette Pho, MD  glucose blood test strip Use to check blood sugar once daily or as directed. 05/14/20   Sherlyn Lick, MD  Insulin Syringe/Needle U-500 (BD INSULIN SYRINGE U-500) 31G X 0.5 ML MISC 1 Units by Does not apply route daily. Patient not taking: Reported on 03/17/2022 11/02/20   Sherlyn Lick, MD  LANTUS SOLOSTAR 100 UNIT/ML Solostar Pen INJECT 20 UNITS INTO THE SKIN EVERY DAY Patient not taking: Reported on 03/17/2022 11/16/20   Sherlyn Lick, MD  lidocaine (LIDODERM) 5 % Place 1 patch onto the skin daily. Remove & Discard patch within 12 hours or as directed by MD Patient not taking: Reported on 03/17/2022 09/21/21   Sponseller, Eugene Gavia, PA-C  metFORMIN (GLUMETZA) 1000 MG (MOD) 24 hr tablet Take 1 tablet (1,000 mg total) by mouth 2 (two) times daily. 03/18/22   Fayette Pho, MD      Allergies    Patient has no known allergies.    Review of Systems   Review of Systems  Constitutional:  Negative for chills and fever.  HENT:  Negative for congestion and rhinorrhea.   Respiratory:  Positive for cough. Negative for shortness of breath.   Cardiovascular:  Negative for chest pain.  Gastrointestinal:  Negative for abdominal pain, constipation, diarrhea, nausea and vomiting.  Musculoskeletal:  Positive for arthralgias. Negative for back pain and neck pain.  Neurological:  Negative for numbness.    Physical Exam Updated Vital Signs BP (!) 164/88   Pulse 64   Temp 98.1 F (36.7 C) (Oral)   Resp 16   Ht 6' (1.829 m)   Wt 122.5 kg   SpO2 97%   BMI 36.63 kg/m  Physical Exam Vitals and nursing note reviewed.  Constitutional:       General: She is not in acute distress.    Appearance: Normal appearance. She is not ill-appearing or toxic-appearing.  Eyes:     General: No scleral icterus. Cardiovascular:     Rate and Rhythm: Normal rate.  Pulmonary:     Effort: Pulmonary effort is normal. No respiratory distress.     Breath sounds: Normal breath sounds.  Musculoskeletal:        General: Tenderness present.     Cervical back: Normal range of motion. No rigidity or tenderness.     Comments: Tenderness with palpable muscle spasm to more superior/posterior aspect of the right shoulder. No overlying increase in warmth, erythema, fluctuance, induration, or rash. More pain with abduction, especially against resistance. Compartments are soft. Strength intact but with pain of the RUE. I do not appreciate any coloration or temperature differences bilaterally. Palpable pulses. Cap refill brisk. Sensation reportedly intact. No tenderness in the chest wall or spine.   Skin:    General: Skin is dry.     Findings: No rash.  Neurological:     General: No focal deficit present.     Mental Status: She is alert. Mental status is at baseline.  Psychiatric:        Mood and Affect: Mood normal.     ED Results / Procedures / Treatments   Labs (all labs ordered are listed, but only abnormal results are displayed) Labs Reviewed - No data to display  EKG None  Radiology DG Chest 2 View  Result Date: 03/11/2023 CLINICAL DATA:  Pain EXAM: CHEST - 2 VIEW COMPARISON:  09/21/2021 FINDINGS: The heart size and mediastinal contours are stable. Both lungs are clear. The visualized skeletal structures are unremarkable. IMPRESSION: No active cardiopulmonary disease. Electronically Signed   By: Duanne GuessNicholas  Plundo D.O.   On: 03/11/2023 12:02   DG Shoulder Right  Result Date: 03/11/2023 CLINICAL DATA:  Right shoulder pain EXAM: RIGHT SHOULDER - 2+ VIEW COMPARISON:  None Available. FINDINGS: There is no evidence of fracture or dislocation. There is no  evidence of significant arthropathy or other focal bone abnormality. Soft tissues are unremarkable. IMPRESSION: Negative. Electronically Signed   By: Duanne GuessNicholas  Plundo D.O.   On: 03/11/2023 12:01    Procedures Procedures   Medications Ordered in ED Medications  lidocaine (LIDODERM) 5 % 1 patch (1 patch Transdermal Patch Applied 03/11/23 1154)  benazepril (LOTENSIN) tablet 40 mg (40 mg Oral Given 03/11/23 1155)  ibuprofen (ADVIL) tablet 600 mg (600 mg Oral Given 03/11/23 1155)  amLODipine (NORVASC) tablet 10 mg (10 mg Oral Given 03/11/23 1155)    ED Course/ Medical Decision Making/ A&P                            Medical Decision Making Amount  and/or Complexity of Data Reviewed Radiology: ordered.  Risk Prescription drug management.    47 y.o. female presents to the ER today for evaluation of right shoulder pain. Differential diagnosis includes but is not limited to sprain, strain, fracture, arthritis, muscle spasm, radiculopathy, PNA. Vital signs show hypertension, but she did not take her medication this AM. Physical exam as noted above.   XR imaging of the chest and shoulder are unremarkable. Her cough is chronic from smoking. She doesn't have any fever, hemoptysis, or complaints of SOB. Less likely PNA or referred pain. She has pain with ROM and had similar pain in which she went to neuro and PT for. Likely similar situation. Will discharge her home with a muscle relaxer and recommend Tylenol/ibuprofen for pain. She reports she has seen her PCP about this problem and they have been working on getting her into the neurologist again.  The patient reports that she feels better after the pain medication and lidocaine patch. BP has improved after medication. She has good sensation, good pulses, and compartments are soft. Patient is safe for outpatient follow up.We discussed plan at bedside. We discussed strict return precautions and red flag symptoms. The patient verbalized their understanding and  agrees to the plan. The patient is stable and being discharged home in good condition.   Final Clinical Impression(s) / ED Diagnoses Final diagnoses:  Acute pain of right shoulder    Rx / DC Orders ED Discharge Orders          Ordered    tiZANidine (ZANAFLEX) 4 MG tablet  Every 6 hours PRN        03/11/23 1307              Achille RichRansom, Tykisha Areola, PA-C 03/13/23 [id number]    Derwood KaplanNanavati, Ankit, MD 03/20/23 1534

## 2023-03-11 NOTE — ED Triage Notes (Signed)
Pt states whenever she coughs she gets right shoulder pain that radiates down right armx1wk. Pt states her right arm has been getting stiff and it's hard to get out of bed due to thatx1wk. Pt denies chest pain or SOB.

## 2023-05-02 ENCOUNTER — Encounter (HOSPITAL_COMMUNITY): Payer: Self-pay | Admitting: Emergency Medicine

## 2023-05-02 ENCOUNTER — Emergency Department (HOSPITAL_COMMUNITY)
Admission: EM | Admit: 2023-05-02 | Discharge: 2023-05-03 | Discharge status: Against Medical Advice | Payer: Medicaid Other | Attending: Emergency Medicine | Admitting: Emergency Medicine

## 2023-05-02 ENCOUNTER — Other Ambulatory Visit: Payer: Self-pay

## 2023-05-02 DIAGNOSIS — M25511 Pain in right shoulder: Secondary | ICD-10-CM | POA: Diagnosis not present

## 2023-05-02 DIAGNOSIS — Z7982 Long term (current) use of aspirin: Secondary | ICD-10-CM | POA: Diagnosis not present

## 2023-05-02 DIAGNOSIS — Z7984 Long term (current) use of oral hypoglycemic drugs: Secondary | ICD-10-CM | POA: Insufficient documentation

## 2023-05-02 DIAGNOSIS — Z79899 Other long term (current) drug therapy: Secondary | ICD-10-CM | POA: Insufficient documentation

## 2023-05-02 DIAGNOSIS — Z794 Long term (current) use of insulin: Secondary | ICD-10-CM | POA: Insufficient documentation

## 2023-05-02 DIAGNOSIS — Z5321 Procedure and treatment not carried out due to patient leaving prior to being seen by health care provider: Secondary | ICD-10-CM | POA: Insufficient documentation

## 2023-05-02 DIAGNOSIS — G8929 Other chronic pain: Secondary | ICD-10-CM | POA: Insufficient documentation

## 2023-05-02 NOTE — ED Triage Notes (Signed)
Pt in with continued R shoulder pain, uncontrolled with prescribed muscle relaxers. Pt states she was seen back in March for the same, but pain continues. Limited ROM, denies any further injuries

## 2023-05-03 ENCOUNTER — Emergency Department (HOSPITAL_COMMUNITY)
Admission: EM | Admit: 2023-05-03 | Discharge: 2023-05-03 | Disposition: A | Discharge status: Home / Self Care | Payer: Medicaid Other | Source: Home / Self Care | Attending: Emergency Medicine | Admitting: Emergency Medicine

## 2023-05-03 ENCOUNTER — Other Ambulatory Visit: Payer: Self-pay

## 2023-05-03 DIAGNOSIS — M25511 Pain in right shoulder: Secondary | ICD-10-CM | POA: Insufficient documentation

## 2023-05-03 DIAGNOSIS — Z7982 Long term (current) use of aspirin: Secondary | ICD-10-CM | POA: Insufficient documentation

## 2023-05-03 DIAGNOSIS — Z79899 Other long term (current) drug therapy: Secondary | ICD-10-CM | POA: Insufficient documentation

## 2023-05-03 DIAGNOSIS — Z7984 Long term (current) use of oral hypoglycemic drugs: Secondary | ICD-10-CM | POA: Insufficient documentation

## 2023-05-03 DIAGNOSIS — G8929 Other chronic pain: Secondary | ICD-10-CM | POA: Diagnosis not present

## 2023-05-03 DIAGNOSIS — Z794 Long term (current) use of insulin: Secondary | ICD-10-CM | POA: Insufficient documentation

## 2023-05-03 NOTE — ED Triage Notes (Signed)
Pt. Stated, I have rt. Arm pain that started 2 months ago . I was here 2 months ago for the same thing they gave me a muscle relaxant and all it does is put me to sleep and it still hurts.

## 2023-05-03 NOTE — ED Provider Notes (Signed)
EMERGENCY DEPARTMENT AT [facility] Provider Note   CSN: [id number] Arrival date & time: 05/03/23  9528     History  Chief Complaint  Patient presents with   Arm Pain    Darlene Bennett is a 47 y.o. female.  HPI   47 year old female presents today complaining of 2 months of right shoulder pain.  She denies injury.  Pain is worse with palpation and movement of right shoulder.  She has not had any redness, swelling, history of DVT or PE.  She is not a blood thinners.  Pain travels from the shoulder down to her hand but is chiefly in the shoulder area  Home Medications Prior to Admission medications   Medication Sig Start Date End Date Taking? Authorizing Provider  Accu-Chek Softclix Lancets lancets USE AS DIRECTED TO TEST BLOOD SUGAR FOUR TIMES DAILY 09/28/20   Sherlyn Lick, MD  amLODipine-benazepril (LOTREL) 10-40 MG capsule 1 cap(s) orally once a day for 90 days 03/18/22   Fayette Pho, MD  aspirin EC 81 MG EC tablet Take 1 tablet (81 mg total) by mouth daily. Swallow whole. 07/23/20   Dollene Cleveland, DO  atorvastatin (LIPITOR) 80 MG tablet TAKE 1 TABLET(80 MG) BY MOUTH DAILY 03/18/22   Fayette Pho, MD  blood glucose meter kit and supplies KIT Dispense based on patient and insurance preference. Use up to four times daily as directed. (FOR ICD-9 250.00, 250.01). 07/24/20   Fayette Pho, MD  Blood Glucose Monitoring Suppl (ACCU-CHEK NANO SMARTVIEW) W/DEVICE KIT 1 Device by Does not apply route 4 (four) times daily -  before meals and at bedtime. 03/09/15   Pincus Large, DO  diclofenac (VOLTAREN) 75 MG EC tablet Take 1 tablet (75 mg total) by mouth 2 (two) times daily. Patient not taking: Reported on 03/17/2022 07/29/21   York Spaniel, MD  ezetimibe (ZETIA) 10 MG tablet Take 1 tablet (10 mg total) by mouth daily. Patient not taking: Reported on 04/07/2022 03/18/22   Fayette Pho, MD  glucose blood test strip Use to check blood sugar once  daily or as directed. 05/14/20   Sherlyn Lick, MD  Insulin Syringe/Needle U-500 (BD INSULIN SYRINGE U-500) 31G X 0.5 ML MISC 1 Units by Does not apply route daily. Patient not taking: Reported on 03/17/2022 11/02/20   Sherlyn Lick, MD  LANTUS SOLOSTAR 100 UNIT/ML Solostar Pen INJECT 20 UNITS INTO THE SKIN EVERY DAY Patient not taking: Reported on 03/17/2022 11/16/20   Sherlyn Lick, MD  lidocaine (LIDODERM) 5 % Place 1 patch onto the skin daily. Remove & Discard patch within 12 hours or as directed by MD Patient not taking: Reported on 03/17/2022 09/21/21   Sponseller, Eugene Gavia, PA-C  metFORMIN (GLUMETZA) 1000 MG (MOD) 24 hr tablet Take 1 tablet (1,000 mg total) by mouth 2 (two) times daily. 03/18/22   Fayette Pho, MD  tiZANidine (ZANAFLEX) 4 MG tablet Take 1 tablet (4 mg total) by mouth every 6 (six) hours as needed for muscle spasms. 03/11/23   Achille Rich, PA-C      Allergies    Patient has no known allergies.    Review of Systems   Review of Systems  Physical Exam Updated Vital Signs BP (!) 164/109   Pulse 90   Temp 98.1 F (36.7 C)   Resp 17   Ht 1.829 m (6')   Wt 124.3 kg   LMP 04/17/2023   SpO2 96%   BMI 37.16 kg/m  Physical Exam Vitals and nursing note reviewed.  Constitutional:      Appearance: Normal appearance.  HENT:     Head: Normocephalic.     Right Ear: External ear normal.     Left Ear: External ear normal.     Nose: Nose normal.     Mouth/Throat:     Pharynx: Oropharynx is clear.  Eyes:     Extraocular Movements: Extraocular movements intact.     Pupils: Pupils are equal, round, and reactive to light.  Cardiovascular:     Rate and Rhythm: Normal rate.  Pulmonary:     Effort: Pulmonary effort is normal.  Musculoskeletal:     Cervical back: Normal range of motion and neck supple.     Comments: Right shoulder visually inspected with no obvious signs of trauma or swelling Right upper extremity visually inspected no signs of  trauma or swelling There is pain palpation over the right trapezius and throughout the right shoulder No obvious bony deformity Radial pulses are intact Sensation is intact Patient has full active range of motion of right shoulder There is no change in pain with lateral abduction or abduction of right shoulder Some pain with internal and external rotation of right shoulder  Skin:    General: Skin is warm and dry.     Capillary Refill: Capillary refill takes less than 2 seconds.  Neurological:     General: No focal deficit present.     Mental Status: She is alert.  Psychiatric:        Mood and Affect: Mood normal.     ED Results / Procedures / Treatments   Labs (all labs ordered are listed, but only abnormal results are displayed) Labs Reviewed - No data to display  EKG None  Radiology No results found.  Procedures Procedures    Medications Ordered in ED Medications - No data to display  ED Course/ Medical Decision Making/ A&P                             Medical Decision Making  47 year old female presents today complaining of atraumatic right upper extremity pain.  Differential diagnosis includes but is not limited to trauma, infection, cervical etiology including nerve impingement, vascular insufficiency, orthopedic etiologies such as rotator cuff etiology, frozen shoulder. Patient had previous x-[name] did not show any evidence of acute abnormality There is no pain relief with holding her arm over her head.  There is some increased pain with sitting her head to the contralateral side.  No increased pain with axial loading.  Doubt cervical etiology She does have pain with palpation of the musculature around the right shoulder. Plan sling for comfort.  We have discussed conservative therapies such as cold and warm therapy and continued acetaminophen. She is referred to orthopedic surgery for follow-up as she has had pain for 2 months.        Final Clinical  Impression(s) / ED Diagnoses Final diagnoses:  Chronic right shoulder pain    Rx / DC Orders ED Discharge Orders     None         Margarita Grizzle, MD 05/03/23 1001

## 2023-05-03 NOTE — Discharge Instructions (Addendum)
Use use sling as needed for pain You can continue to use acetaminophen and ibuprofen. You should alternate warm and cold therapy.  Please take the sling off several times a day and move the shoulder around. Call Dr. Aundria Rud office for appointment for follow-up.

## 2023-05-11 DIAGNOSIS — M25511 Pain in right shoulder: Secondary | ICD-10-CM | POA: Diagnosis not present

## 2023-05-11 DIAGNOSIS — M542 Cervicalgia: Secondary | ICD-10-CM | POA: Diagnosis not present

## 2023-05-25 DIAGNOSIS — I10 Essential (primary) hypertension: Secondary | ICD-10-CM | POA: Diagnosis not present

## 2023-05-25 DIAGNOSIS — E1165 Type 2 diabetes mellitus with hyperglycemia: Secondary | ICD-10-CM | POA: Diagnosis not present

## 2023-05-25 DIAGNOSIS — E782 Mixed hyperlipidemia: Secondary | ICD-10-CM | POA: Diagnosis not present

## 2023-06-15 ENCOUNTER — Ambulatory Visit: Payer: Medicaid Other | Admitting: Physical Therapy

## 2023-06-15 DIAGNOSIS — M542 Cervicalgia: Secondary | ICD-10-CM | POA: Diagnosis not present

## 2023-06-15 DIAGNOSIS — M25511 Pain in right shoulder: Secondary | ICD-10-CM | POA: Diagnosis not present

## 2023-06-21 DIAGNOSIS — I10 Essential (primary) hypertension: Secondary | ICD-10-CM | POA: Diagnosis not present

## 2023-06-21 DIAGNOSIS — E1165 Type 2 diabetes mellitus with hyperglycemia: Secondary | ICD-10-CM | POA: Diagnosis not present

## 2023-06-21 DIAGNOSIS — E782 Mixed hyperlipidemia: Secondary | ICD-10-CM | POA: Diagnosis not present

## 2023-06-22 DIAGNOSIS — I10 Essential (primary) hypertension: Secondary | ICD-10-CM | POA: Diagnosis not present

## 2023-06-22 DIAGNOSIS — E782 Mixed hyperlipidemia: Secondary | ICD-10-CM | POA: Diagnosis not present

## 2023-06-22 DIAGNOSIS — E1165 Type 2 diabetes mellitus with hyperglycemia: Secondary | ICD-10-CM | POA: Diagnosis not present

## 2023-06-27 NOTE — Therapy (Addendum)
OUTPATIENT PHYSICAL THERAPY EVALUATION  DISCHARGE   Patient Name: Darlene Bennett MRN: [id number] DOB:1976/08/14, 47 y.o., female Today's Date: 06/29/2023   END OF SESSION:  PT End of Session - 06/29/23 1004     Visit Number 1    Number of Visits 9    Date for PT Re-Evaluation 08/24/23    Authorization Type [employer]    PT Start Time 1015    PT Stop Time 1100    PT Time Calculation (min) 45 min    Activity Tolerance Patient tolerated treatment well    Behavior During Therapy [facility] for tasks assessed/performed             Past Medical History:  Diagnosis Date   Blister of breast with infection, left, initial encounter 07/09/2020   Contraceptive management 05/25/2015   S/p BTL 2007 but got pregnant. Then received depo for a while. Copper IUD placed 08/17/2020   Encounter for insertion of copper intrauterine contraceptive device (IUD) 08/16/2020   Epidermoid cyst 08/04/2016   Gestational diabetes    insulin   Gestational diabetes mellitus, antepartum 02/14/2013   History of gestational diabetes    Hyperglycemia    Hypertension    preeclampsia with previous pregnancy   Knee pain, right 09/16/2020   Scalp cyst    Slurred speech    Stroke (HCC)    8/21, speech difficulties   Past Surgical History:  Procedure Laterality Date   CYST EXCISION N/A 09/15/2016   Procedure: EXCISION POSTERIOR SCALP CYST;  Surgeon: Berna Bue, MD;  Location: [facility];  Service: General;  Laterality: N/A;   TUBAL LIGATION     2007   Patient Active Problem List   Diagnosis Date Noted   Missed menses 03/10/2022   Long term (current) use of insulin (HCC) 01/10/2022   Personal history of transient ischemic attack (TIA), and cerebral infarction without residual deficits 01/10/2022   Urinary tract infection without hematuria 10/19/2021   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 (HCC)    Morbid obesity (HCC) 05/11/2020   Chronic back pain  10/31/2018   Right sciatic nerve pain 10/31/2018   Type 2 diabetes mellitus (HCC) 04/24/2015   Microalbuminuria due to type 2 diabetes mellitus (HCC) 03/25/2015   Hyperlipemia 03/09/2015   Tobacco use disorder 03/09/2015   Essential hypertension 11/15/2012   Gastroesophageal reflux disease 05/23/2012   Vitamin D deficiency 05/23/2012    PCP: Jackie Plum, MD  REFERRING PROVIDER: Dion Saucier D, PA  REFERRING DIAG: Cervicalgia  THERAPY DIAG:  Cervicalgia  Pain in right arm  Muscle weakness (generalized)  Abnormal posture  Rationale for Evaluation and Treatment: Rehabilitation  ONSET DATE: 4 months   SUBJECTIVE:  SUBJECTIVE STATEMENT: Patient reports pain that has been going on for about 4 months now, the pain will go from her right neck to the shoulder and down the right arm. If she sits up for too long she last to lean to the left to ease the pain. She also has difficulty going to sleep because of the pain, she used to lying on her right side. She cannot remember a mechanism of injury. She did have a shot in her shoulder that did not help with her pain. Denies any numbness or tingling in the right arm/hand.  Hand dominance: Left  PERTINENT HISTORY:  See PMH above  PAIN:  Are you having pain? Yes:  NPRS scale: 7/10 Pain location: Right side of neck, shoulder, and right arm Pain description: Sharp, dull Aggravating factors: Sit up straight, sleeping on right side, using the right arm repetitively  Relieving factors: Leaning over to the left  PRECAUTIONS: None  RED FLAGS: None    WEIGHT BEARING RESTRICTIONS: No  FALLS:  Has patient fallen in last 6 months? No  PLOF: Independent  PATIENT GOALS: Pain relief   OBJECTIVE:  PATIENT SURVEYS:  FOTO 17%  functional status  COGNITION: Overall cognitive status: Within functional limits for tasks assessed  SENSATION: Appears intact  POSTURE:   Rounded shoulder and forward head posture  PALPATION: Tender to palpation right cervical paraspinals, upper trap, infraspinatus   CERVICAL ROM:   Active ROM A/PROM (deg) eval  Flexion 35  Extension 20  Right lateral flexion 10  Left lateral flexion 10  Right rotation 30  Left rotation 40   (Blank rows = not tested)  UPPER EXTREMITY ROM:  Active ROM Right eval Left eval  Shoulder flexion 90 145  Shoulder extension    Shoulder abduction    Shoulder adduction    Shoulder extension    Shoulder internal rotation    Shoulder external rotation    Elbow flexion    Elbow extension    Wrist flexion    Wrist extension    Wrist ulnar deviation    Wrist radial deviation    Wrist pronation    Wrist supination     (Blank rows = not tested)  UPPER EXTREMITY MMT:  MMT Right eval Left eval  Shoulder flexion 3- 4  Shoulder extension    Shoulder abduction    Shoulder adduction    Shoulder extension    Shoulder internal rotation    Shoulder external rotation 4 5  Middle trapezius 3- 3  Lower trapezius    Elbow flexion    Elbow extension    Wrist flexion    Wrist extension    Wrist ulnar deviation    Wrist radial deviation    Wrist pronation    Wrist supination    Grip strength     (Blank rows = not tested)  CERVICAL SPECIAL TESTS:  Spurling's positive on right  FUNCTIONAL TESTS:  DNF endurance: unable   TODAY'S TREATMENT:    [facility] Adult PT Treatment:                                                DATE: 06/29/2023 Therapeutic Exercise: Seated upper trap stretch Supine chin tuck with small towel roll under neck Supine dowel flexion Trigger Point Dry Needling Treatment: Pre-treatment instruction: Patient instructed on dry needling rationale, procedures, and possible side  effects including pain during treatment  (achy,cramping feeling), bruising, drop of blood, lightheadedness, nausea, sweating. Patient Consent Given: Yes Education handout provided: No Muscles treated: Right upper trap and cervical paraspinals  Needle size and number: .30x39mm x 3 Electrical stimulation performed: No Parameters: N/A Treatment response/outcome: Twitch response elicited and Palpable decrease in muscle tension Post-treatment instructions: Patient instructed to expect possible mild to moderate muscle soreness later today and/or tomorrow. Patient instructed in methods to reduce muscle soreness and to continue prescribed HEP. If patient was dry needled over the lung field, patient was instructed on signs and symptoms of pneumothorax and, however unlikely, to see immediate medical attention should they occur. Patient was also educated on signs and symptoms of infection and to seek medical attention should they occur. Patient verbalized understanding of these instructions and education.  PATIENT EDUCATION:  Education details: Exam findings, POC, HEP Person educated: Patient Education method: Explanation, Demonstration, Tactile cues, Verbal cues, and Handouts Education comprehension: verbalized understanding, returned demonstration, verbal cues required, tactile cues required, and needs further education  HOME EXERCISE PROGRAM: Access Code: [email or web address]    ASSESSMENT: CLINICAL IMPRESSION: Patient is a 47 y.o. female who was seen today for physical therapy evaluation and treatment for chronic right sided neck, shoulder and arm pain with no apparent mechanism. Her current symptoms do seem radicular in nature and she demonstrates limitations in her cervical motion, right shoulder motion, postural and shoulder strength, and increased muscular tension of the right neck and shoulder. Performed TPDN this visit for the right upper trap and cervical paraspinals with good therapeutic benefit.    OBJECTIVE IMPAIRMENTS: decreased activity  tolerance, decreased ROM, decreased strength, postural dysfunction, and pain.   ACTIVITY LIMITATIONS: carrying, lifting, sitting, sleeping, bathing, dressing, reach over head, and hygiene/grooming  PARTICIPATION LIMITATIONS: meal prep, cleaning, shopping, and community activity  PERSONAL FACTORS: Fitness, Past/current experiences, and Time since onset of injury/illness/exacerbation are also affecting patient's functional outcome.   REHAB POTENTIAL: Good  CLINICAL DECISION MAKING: Stable/uncomplicated  EVALUATION COMPLEXITY: Low   GOALS: Goals reviewed with patient? Yes  SHORT TERM GOALS: Target date: 07/27/2023  Patient will be I with initial HEP in order to progress with therapy. Baseline: HEP provided at eval Goal status: INITIAL  2.  Patient will report right sided neck and arm pain </= 4/10 in order to reduce functional limitations Baseline: 7/10 Goal status: INITIAL  3.  Patient will demonstrate cervical rotation >/= 60 deg bilaterally in order to indicate reduced muscle tension and improve driving ability Baseline: see limitations above Goal status: INITIAL  LONG TERM GOALS: Target date: 08/24/2023  Patient will be I with final HEP to maintain progress from PT. Baseline: HEP provided at eval Goal status: INITIAL  2.  Patient will report >/= 44% status on FOTO to indicate improved functional ability. Baseline: 17% functional status Goal status: INITIAL  3.  Patient will demonstrate periscapular strength >/= 4-/5 MMT and DNF endurance >/= 15 seconds in order to improve postural control and reduce pain with sitting Baseline: see limitations above Goal status: INITIAL  4.  Patient will demonstrate right shoulder flexion AROM >/= 140 deg in order to improve overhead reach and ability to perform self care tasks Baseline: 90 deg Goal status: INITIAL   PLAN: PT FREQUENCY: 1x/week  PT DURATION: 8 weeks  PLANNED INTERVENTIONS: Therapeutic exercises, Therapeutic  activity, Neuromuscular re-education, Balance training, Gait training, Patient/Family education, Self Care, Joint mobilization, Aquatic Therapy, Dry Needling, Spinal manipulation, Spinal mobilization, Cryotherapy, Moist heat, Manual therapy, and Re-evaluation  PLAN FOR NEXT SESSION: Review HEP and progress PRN, manual/TPDN for right cervical paraspinals/upper trap/posterior shoulder region, cervical stretching, trial suboccipital release, right shoulder AAROM>AROM, DNF endurance and postural strengthening   Rosana Hoes, PT, DPT, LAT, ATC 06/29/23  1:01 PM Phone: [phone] Fax: [phone]    Check all possible CPT codes: 01027 - PT Re-evaluation, 97110- Therapeutic Exercise, [phone]- Neuro Re-education, 97140 - Manual Therapy, 97530 - Therapeutic Activities, and 97535 - Self Care    Check all conditions that are expected to impact treatment: {Conditions expected to impact treatment:Morbid obesity, Diabetes mellitus, Musculoskeletal disorders, and Social determinants of health   If treatment provided at initial evaluation, no treatment charged due to lack of authorization.      PHYSICAL THERAPY DISCHARGE SUMMARY  Visits from Start of Care: 1  Current functional level related to goals / functional outcomes: See above   Remaining deficits: See above   Education / Equipment: HEP   Patient agrees to discharge. Patient goals were not met. Patient is being discharged due to not returning since the last visit.  Rosana Hoes, PT, DPT, LAT, ATC 09/25/23  8:41 AM Phone: [phone] Fax: [phone]

## 2023-06-29 ENCOUNTER — Other Ambulatory Visit: Payer: Self-pay

## 2023-06-29 ENCOUNTER — Ambulatory Visit: Payer: Medicaid Other | Attending: Internal Medicine | Admitting: Physical Therapy

## 2023-06-29 ENCOUNTER — Encounter: Payer: Self-pay | Admitting: Physical Therapy

## 2023-06-29 DIAGNOSIS — M542 Cervicalgia: Secondary | ICD-10-CM | POA: Diagnosis present

## 2023-06-29 DIAGNOSIS — M6281 Muscle weakness (generalized): Secondary | ICD-10-CM | POA: Diagnosis present

## 2023-06-29 DIAGNOSIS — R293 Abnormal posture: Secondary | ICD-10-CM | POA: Insufficient documentation

## 2023-06-29 DIAGNOSIS — M79601 Pain in right arm: Secondary | ICD-10-CM | POA: Insufficient documentation

## 2023-06-29 NOTE — Patient Instructions (Signed)
Access Code: [email or web address] URL: [email or web address] Date: 06/29/2023 Prepared by: Rosana Hoes  Exercises - Seated Cervical Sidebending Stretch  - 2 x daily - 3 reps - 15 seconds hold - Seated Scapular Retraction  - 2 x daily - 10 reps - 5 seconds hold - Supine Passive Cervical Retraction  - 2 x daily - 10 reps - 5 seconds hold - Supine Shoulder Flexion Extension AAROM with Dowel  - 2 x daily - 10 reps - 5 seconds hold

## 2023-07-17 ENCOUNTER — Ambulatory Visit: Payer: Medicaid Other | Admitting: Physical Therapy

## 2023-07-24 ENCOUNTER — Ambulatory Visit: Payer: Medicaid Other | Admitting: Physical Therapy

## 2023-07-27 DIAGNOSIS — M25511 Pain in right shoulder: Secondary | ICD-10-CM | POA: Diagnosis not present

## 2023-07-27 DIAGNOSIS — M542 Cervicalgia: Secondary | ICD-10-CM | POA: Diagnosis not present

## 2023-07-31 ENCOUNTER — Ambulatory Visit: Payer: Medicaid Other | Attending: Internal Medicine

## 2023-08-08 ENCOUNTER — Telehealth: Payer: Self-pay

## 2023-08-08 ENCOUNTER — Ambulatory Visit: Payer: Medicaid Other | Attending: Internal Medicine

## 2023-08-08 NOTE — Telephone Encounter (Signed)
PT called and left voicemail for patient regarding missed visit.  Explained that due to attendance policy with two straight no shows her last visit would be cancelled and that she can call back to schedule a new appointment, and that if she missed another future appointment she would be discharged and would need a new referral.  Eloy End, PT  08/08/23 4:20 PM

## 2023-08-15 ENCOUNTER — Ambulatory Visit: Payer: Medicaid Other | Admitting: Physical Therapy

## 2023-12-28 DIAGNOSIS — I10 Essential (primary) hypertension: Secondary | ICD-10-CM | POA: Diagnosis not present

## 2023-12-28 DIAGNOSIS — Z0000 Encounter for general adult medical examination without abnormal findings: Secondary | ICD-10-CM | POA: Diagnosis not present

## 2024-01-02 ENCOUNTER — Other Ambulatory Visit: Payer: Self-pay | Admitting: Physician Assistant

## 2024-01-02 DIAGNOSIS — Z1231 Encounter for screening mammogram for malignant neoplasm of breast: Secondary | ICD-10-CM

## 2024-01-17 ENCOUNTER — Ambulatory Visit
Admission: RE | Admit: 2024-01-17 | Discharge: 2024-01-17 | Disposition: A | Discharge status: Home / Self Care | Payer: Medicaid Other | Source: Ambulatory Visit | Attending: Physician Assistant | Admitting: Physician Assistant

## 2024-01-17 ENCOUNTER — Ambulatory Visit: Payer: Medicaid Other

## 2024-01-17 DIAGNOSIS — Z1231 Encounter for screening mammogram for malignant neoplasm of breast: Secondary | ICD-10-CM

## 2024-01-19 ENCOUNTER — Other Ambulatory Visit: Payer: Self-pay | Admitting: Physician Assistant

## 2024-01-19 DIAGNOSIS — R928 Other abnormal and inconclusive findings on diagnostic imaging of breast: Secondary | ICD-10-CM

## 2024-01-31 ENCOUNTER — Ambulatory Visit: Admission: RE | Admit: 2024-01-31 | Payer: Medicaid Other | Source: Ambulatory Visit

## 2024-01-31 ENCOUNTER — Ambulatory Visit
Admission: RE | Admit: 2024-01-31 | Discharge: 2024-01-31 | Disposition: A | Discharge status: Home / Self Care | Payer: Medicaid Other | Source: Ambulatory Visit | Attending: Physician Assistant | Admitting: Physician Assistant

## 2024-01-31 ENCOUNTER — Other Ambulatory Visit: Payer: Self-pay | Admitting: Physician Assistant

## 2024-01-31 DIAGNOSIS — R928 Other abnormal and inconclusive findings on diagnostic imaging of breast: Secondary | ICD-10-CM

## 2024-01-31 DIAGNOSIS — N6489 Other specified disorders of breast: Secondary | ICD-10-CM

## 2024-02-08 ENCOUNTER — Ambulatory Visit
Admission: RE | Admit: 2024-02-08 | Discharge: 2024-02-08 | Disposition: A | Discharge status: Home / Self Care | Payer: Medicaid Other | Source: Ambulatory Visit | Attending: Physician Assistant | Admitting: Physician Assistant

## 2024-02-08 ENCOUNTER — Ambulatory Visit
Admission: RE | Admit: 2024-02-08 | Discharge: 2024-02-08 | Disposition: A | Discharge status: Home / Self Care | Source: Ambulatory Visit | Attending: Physician Assistant | Admitting: Physician Assistant

## 2024-02-08 DIAGNOSIS — N6489 Other specified disorders of breast: Secondary | ICD-10-CM

## 2024-02-08 HISTORY — PX: BREAST BIOPSY: SHX20

## 2024-02-09 LAB — SURGICAL PATHOLOGY

## 2024-03-04 ENCOUNTER — Other Ambulatory Visit: Payer: Self-pay | Admitting: General Surgery

## 2024-03-04 DIAGNOSIS — N6489 Other specified disorders of breast: Secondary | ICD-10-CM

## 2024-03-06 ENCOUNTER — Other Ambulatory Visit: Payer: Self-pay | Admitting: General Surgery

## 2024-03-06 DIAGNOSIS — N6489 Other specified disorders of breast: Secondary | ICD-10-CM

## 2024-03-18 ENCOUNTER — Encounter (HOSPITAL_BASED_OUTPATIENT_CLINIC_OR_DEPARTMENT_OTHER): Payer: Self-pay | Admitting: General Surgery

## 2024-03-18 ENCOUNTER — Other Ambulatory Visit: Payer: Self-pay

## 2024-03-18 NOTE — Progress Notes (Signed)
   03/18/24 1335  Pre-op Phone Call  Surgery Date Verified 03/26/24  Arrival Time Verified 0700  Surgery Location Verified [facility] [place]  Medical History Reviewed Yes  Is the patient taking a GLP-1 receptor agonist? No  Does the patient have diabetes? Type II  Does the patient use a Continuous Blood Glucose Monitor? No  Is the patient on an insulin pump? No  Has the diabetes coordinator been notified? No  Do you have a history of heart problems? No  Does patient have other implanted devices? No  Patient Teaching Enhanced Recovery;Pre / Post Procedure  Patient educated about smoking cessation 24 hours prior to surgery. (S)  Yes  Patient verbalizes understanding of bowel prep? N/A  THA/TKA patients only:  By your surgery date, will you have been taking narcotics for 90 days or greater? No  Med Rec Completed Yes  Recent  Lab Work, EKG, CXR? No  NPO (Including gum & candy) After midnight  Allowed clear liquids Water;Gatorade  (diabetics please choose diet or no sugar options)  Patient instructed to stop clear liquids including Carb loading drink at: 0530  Stop Solids, Milk, Candy, and Gum STARTING AT MIDNIGHT  Did patient view EMMI videos? No  Responsible adult to drive and be with you for 24 hours? Yes  Name & Phone Number for Ride/Caregiver husband  No Jewelry, money, nail polish or make-up.  No lotions, powders, perfumes. No shaving  48 hrs. prior to surgery. Yes  Contacts, Dentures & Glasses Will Have to be Removed Before OR. Yes  Please bring your ID and Insurance Card the morning of your surgery. (Surgery Centers Only) Yes  Bring any papers or x-rays with you that your surgeon gave you. Yes  Instructed to contact the location of procedure/ provider if they or anyone in their household develops symptoms or tests positive for COVID-19, has close contact with someone who tests positive for COVID, or has known exposure to any contagious illness. Yes  Call this number the morning of surgery  with  any problems that may cancel your surgery. [phone]  Covid-19 Assessment  Have you had a positive COVID-19 test within the previous 90 days? No  COVID Testing Guidance Proceed with the additional questions.  Patient's surgery required a COVID-19 test (cardiothoracic, complex ENT, and bronchoscopies/ EBUS) No  Have you been unmasked and in close contact with anyone with COVID-19 or COVID-19 symptoms within the past 10 days? No  Do you or anyone in your household currently have any COVID-19 symptoms? No   Left a message with Dr. Mills [place] office about ASA hold or to continue taking.

## 2024-03-20 ENCOUNTER — Encounter (HOSPITAL_BASED_OUTPATIENT_CLINIC_OR_DEPARTMENT_OTHER)
Admission: RE | Admit: 2024-03-20 | Discharge: 2024-03-20 | Disposition: A | Discharge status: Home / Self Care | Source: Ambulatory Visit | Attending: General Surgery | Admitting: General Surgery

## 2024-03-20 DIAGNOSIS — Z01812 Encounter for preprocedural laboratory examination: Secondary | ICD-10-CM | POA: Insufficient documentation

## 2024-03-20 LAB — BASIC METABOLIC PANEL WITH GFR
Anion gap: 10 (ref 5–15)
BUN: 10 mg/dL (ref 6–20)
CO2: 24 mmol/L (ref 22–32)
Calcium: 9.4 mg/dL (ref 8.9–10.3)
Chloride: 101 mmol/L (ref 98–111)
Creatinine, Ser: 0.96 mg/dL (ref 0.44–1.00)
GFR, Estimated: >60 mL/min (ref >60)
Glucose, Bld: 127 mg/dL — ABNORMAL HIGH (ref 70–99)
Potassium: 3.8 mmol/L (ref 3.5–5.1)
Sodium: 135 mmol/L (ref 135–145)

## 2024-03-20 MED ORDER — CHLORHEXIDINE GLUCONATE CLOTH 2 % EX PADS: 6.0000 | MEDICATED_PAD | Freq: Once | CUTANEOUS | Status: DC

## 2024-03-20 NOTE — Progress Notes (Signed)

## 2024-03-25 ENCOUNTER — Ambulatory Visit
Admission: RE | Admit: 2024-03-25 | Discharge: 2024-03-25 | Disposition: A | Discharge status: Home / Self Care | Source: Ambulatory Visit | Attending: General Surgery | Admitting: General Surgery

## 2024-03-25 DIAGNOSIS — N6489 Other specified disorders of breast: Secondary | ICD-10-CM

## 2024-03-25 HISTORY — PX: BREAST BIOPSY: SHX20

## 2024-03-25 NOTE — H&P (Signed)
 REFERRING PHYSICIAN:  Osei-Bonsu   PROVIDER:  Eppie Hasting, MD   Care Team: Patient Care Team: Tretha Fu, MD as PCP - General (Internal Medicine) Eppie Hasting, MD as Consulting Provider (Surgical Oncology)    MRN: [id number] DOB: [birth year] DATE OF ENCOUNTER: 03/04/2024   Subjective    Chief Complaint: Right breast complex sclerosing lesion       History of Present Illness: Darlene Bennett is a 48 y.o. female who is seen today as an office consultation at the request of Dr. Sherlene Diss for evaluation of Right breast complex sclerosing lesion     Patient presents with an abnormal mammogram March 2025.  The patient had screening detected architectural distortion on the right.  She subsequently underwent diagnostic imaging which did not resolve this and continued to show the distortion.  She underwent core needle biopsy.  Pathology showed complex sclerosing lesion and radial scar with fibrocystic changes.  She is recommended for excision.   The patient has not ever had any breast biopsies before.  She has no family history of any malignancy.   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  FINDINGS: Additional views including spot compression demonstrate architectural distortion in the upper central right breast.  IMPRESSION: Architectural distortion in the right breast is suspicious for malignancy.  RECOMMENDATION: Recommend stereotactic guided right breast biopsy.  I have discussed the findings and recommendations with the patient. If applicable, a reminder letter will be sent to the patient regarding the next appointment.  BI-RADS CATEGORY  4: Suspicious.     Pathology core needle biopsy: 02/08/24 1. Breast, right, needle core biopsy, upper central :       COMPLEX SCLEROSING LESION/RADIAL SCAR WITH FIBROCYSTIC  CHANGES       FIBROCYSTIC CHANGES INCLUDING CYSTIC DILATATION OF DUCTS, APOCRINE METAPLASIA,       ADENOSIS, SCLEROSING ADENOSIS AND USUAL DUCT HYPERPLASIA       MICROCALCIFICATIONS PRESENT WITHIN ADENOSIS       NEGATIVE FOR ATYPIA AND CARCINOMA    Review of Systems: A complete review of systems was obtained from the patient.  I have reviewed this information and discussed as appropriate with the patient.  See HPI as well for other ROS. ROS - o/w neg       Medical History: Past Medical History      Past Medical History:  Diagnosis Date   Diabetes mellitus without complication (CMS/HHS-HCC)     GERD (gastroesophageal reflux disease)     Hypertension          Problem List     Patient Active Problem List  Diagnosis   Complex sclerosing lesion of right breast        Past Surgical History       Past Surgical History:  Procedure Laterality Date   EXCISION POSTERIOR SCALP CYST   09/15/2016    Dr. Lanell Pinta        Allergies  No Known Allergies     Medications Ordered Prior to Encounter        Current Outpatient Medications on File Prior to Visit  Medication Sig Dispense Refill   atenoloL-chlorthalidone (TENORETIC) 50-25 mg tablet Take 1 tablet by mouth once daily       aspirin  81 MG EC tablet Take 81 mg by mouth once daily       metFORMIN  (GLUMETZA ) 1000 MG (MOD)  ER tablet Take 1,000 mg by mouth daily with dinner        No current facility-administered medications on file prior to visit.        Family History       Family History  Problem Relation Age of Onset   Diabetes Mother     Diabetes Father          Tobacco Use History  Social History        Tobacco Use  Smoking Status Every Day   Types: Cigarettes  Smokeless Tobacco Never        Social History  Social History         Socioeconomic History   Marital status: Unknown  Tobacco Use   Smoking status: Every Day      Types: Cigarettes   Smokeless tobacco: Never  Substance and Sexual Activity    Alcohol use: Never   Drug use: Never        Objective:         Vitals:    03/04/24 0856  BP: (!) 152/84  Pulse: 71  Temp: 36.7 C (98 F)  SpO2: 98%  Weight: (!) 119.2 kg (262 lb 12.8 oz)  Height: 182.9 cm (6')  PainSc: 0-No pain    Body mass index is 35.64 kg/m.   Gen:  No acute distress.  Well nourished and well groomed.   Neurological: Alert and oriented to person, place, and time. Coordination normal.  Head: Normocephalic and atraumatic.  Eyes: Conjunctivae are normal. Pupils are equal, round, and reactive to light. No scleral icterus.  Neck: Normal range of motion. Neck supple. No tracheal deviation or thyromegaly present.  Cardiovascular: Normal rate, regular rhythm, normal heart sounds and intact distal pulses.  Exam reveals no gallop and no friction rub.  No murmur heard. Breast: Breasts are relatively symmetric.  Positive ptosis.  No palpable masses.  No nipple retraction or nipple discharge.  No contour abnormalities.  No lymphadenopathy present.  No skin dimpling. Respiratory: Effort normal.  No respiratory distress. No chest wall tenderness. Breath sounds normal.  No wheezes, rales or rhonchi.  GI: Soft. Bowel sounds are normal. The abdomen is soft and nontender.  There is no rebound and no guarding.  Musculoskeletal: Normal range of motion. Extremities are nontender.  Lymphadenopathy: No cervical, preauricular, postauricular or axillary adenopathy is present Skin: Skin is warm and dry. No rash noted. No diaphoresis. No erythema. No pallor. No clubbing, cyanosis, or edema.   Psychiatric: Normal mood and affect. Behavior is normal. Judgment and thought content normal.      Labs None available.    Assessment and Plan:        ICD-10-CM    1. Complex sclerosing lesion of right breast  N64.89         Assessment & Plan Breast lesion with atypical biopsy Mammogram and subsequent biopsy of a breast lesion yielded concordant results, but with excision  recommended. To obtain a definitive diagnosis and address the potential for early-stage cancer, a seed localized excisional biopsy is planned. The procedure carries low risk, with possible complications including bleeding, infection, or seroma formation. If cancer is detected, it is likely stage zero, potentially necessitating further surgery, radiation, or antihormone therapy. She was informed about the procedure, associated risks, and post-operative care, and consented to proceed. - Schedule seed localized excisional biopsy - Instruct to visit radiology 1-2 days pre-operatively for marker placement - Perform surgery under general anesthesia - Utilize a probe to locate and  excise the lesion - X-ray excised tissue to confirm removal of clip and seed - Close incision with absorbable sutures and apply breast binder - Advise against heavy lifting or strenuous activity for one week post-operatively - Prescribe narcotic analgesics for immediate post-operative pain, transitioning to acetaminophen  and ibuprofen  - Advise against driving for 2-4 days post-operatively until off narcotics and able to drive safely - Instruct to skip showering the day after surgery, then resume - Arrange for pathology results review approximately two business days post-operatively

## 2024-03-26 ENCOUNTER — Ambulatory Visit
Admission: RE | Admit: 2024-03-26 | Discharge: 2024-03-26 | Disposition: A | Discharge status: Home / Self Care | Source: Ambulatory Visit | Attending: General Surgery | Admitting: General Surgery

## 2024-03-26 ENCOUNTER — Other Ambulatory Visit: Payer: Self-pay

## 2024-03-26 ENCOUNTER — Ambulatory Visit (HOSPITAL_BASED_OUTPATIENT_CLINIC_OR_DEPARTMENT_OTHER)
Admission: RE | Admit: 2024-03-26 | Discharge: 2024-03-26 | Disposition: A | Discharge status: Home / Self Care | Attending: General Surgery | Admitting: General Surgery

## 2024-03-26 ENCOUNTER — Ambulatory Visit (HOSPITAL_BASED_OUTPATIENT_CLINIC_OR_DEPARTMENT_OTHER): Admitting: Certified Registered"

## 2024-03-26 ENCOUNTER — Encounter (HOSPITAL_BASED_OUTPATIENT_CLINIC_OR_DEPARTMENT_OTHER)
Admission: RE | Disposition: A | Discharge status: Home / Self Care | Payer: Self-pay | Source: Home / Self Care | Attending: General Surgery

## 2024-03-26 ENCOUNTER — Encounter (HOSPITAL_BASED_OUTPATIENT_CLINIC_OR_DEPARTMENT_OTHER): Payer: Self-pay | Admitting: General Surgery

## 2024-03-26 DIAGNOSIS — E119 Type 2 diabetes mellitus without complications: Secondary | ICD-10-CM | POA: Insufficient documentation

## 2024-03-26 DIAGNOSIS — N6489 Other specified disorders of breast: Secondary | ICD-10-CM | POA: Diagnosis present

## 2024-03-26 DIAGNOSIS — Z794 Long term (current) use of insulin: Secondary | ICD-10-CM | POA: Diagnosis not present

## 2024-03-26 DIAGNOSIS — Z79899 Other long term (current) drug therapy: Secondary | ICD-10-CM | POA: Insufficient documentation

## 2024-03-26 DIAGNOSIS — N6081 Other benign mammary dysplasias of right breast: Secondary | ICD-10-CM | POA: Diagnosis not present

## 2024-03-26 DIAGNOSIS — Z7984 Long term (current) use of oral hypoglycemic drugs: Secondary | ICD-10-CM | POA: Diagnosis not present

## 2024-03-26 DIAGNOSIS — I10 Essential (primary) hypertension: Secondary | ICD-10-CM | POA: Diagnosis not present

## 2024-03-26 DIAGNOSIS — F17210 Nicotine dependence, cigarettes, uncomplicated: Secondary | ICD-10-CM | POA: Insufficient documentation

## 2024-03-26 DIAGNOSIS — Z01818 Encounter for other preprocedural examination: Secondary | ICD-10-CM

## 2024-03-26 HISTORY — PX: RADIOACTIVE SEED GUIDED EXCISIONAL BREAST BIOPSY: SHX6490

## 2024-03-26 HISTORY — PX: RADIOACTIVE SEED GUIDED AXILLARY SENTINEL LYMPH NODE: SHX6735

## 2024-03-26 LAB — POCT PREGNANCY, URINE: Preg Test, Ur: NEGATIVE

## 2024-03-26 LAB — GLUCOSE, CAPILLARY
Glucose-Capillary: 156 mg/dL — ABNORMAL HIGH (ref 70–99)
Glucose-Capillary: 144 mg/dL — ABNORMAL HIGH (ref 70–99)

## 2024-03-26 SURGERY — RADIOACTIVE SEED GUIDED AXILLARY SENTINEL LYMPH NODE BIOPSY
Anesthesia: General | Site: Breast | Laterality: Right

## 2024-03-26 MED ORDER — LIDOCAINE HCL (CARDIAC) PF 100 MG/5ML IV SOSY
PREFILLED_SYRINGE | INTRAVENOUS | Status: DC | PRN
  Administered 2024-03-26: 100 mg via INTRAVENOUS

## 2024-03-26 MED ORDER — FENTANYL CITRATE (PF) 100 MCG/2ML IJ SOLN
INTRAMUSCULAR | Status: DC | PRN
  Administered 2024-03-26: 100 ug via INTRAVENOUS

## 2024-03-26 MED ORDER — BUPIVACAINE HCL (PF) 0.25 % IJ SOLN
INTRAMUSCULAR | Status: AC
  Filled 2024-03-26: qty 90

## 2024-03-26 MED ORDER — GLYCOPYRROLATE 0.2 MG/ML IJ SOLN
INTRAMUSCULAR | Status: DC | PRN
Start: 2024-03-26 — End: 2024-03-26
  Administered 2024-03-26: .2 mg via INTRAVENOUS

## 2024-03-26 MED ORDER — CEFAZOLIN SODIUM-DEXTROSE 3-4 GM/150ML-% IV SOLN: 3.0000 g | INTRAVENOUS | Status: DC

## 2024-03-26 MED ORDER — PROPOFOL 10 MG/ML IV BOLUS
INTRAVENOUS | Status: DC | PRN
  Administered 2024-03-26: 200 mg via INTRAVENOUS

## 2024-03-26 MED ORDER — PHENYLEPHRINE HCL (PRESSORS) 10 MG/ML IV SOLN
INTRAVENOUS | Status: DC | PRN
  Administered 2024-03-26 (×2): 160 ug via INTRAVENOUS

## 2024-03-26 MED ORDER — DEXAMETHASONE SODIUM PHOSPHATE 10 MG/ML IJ SOLN
INTRAMUSCULAR | Status: DC | PRN
  Administered 2024-03-26: 5 mg via INTRAVENOUS

## 2024-03-26 MED ORDER — ONDANSETRON HCL 4 MG/2ML IJ SOLN
INTRAMUSCULAR | Status: DC | PRN
  Administered 2024-03-26: 4 mg via INTRAVENOUS

## 2024-03-26 MED ORDER — MIDAZOLAM HCL 2 MG/2ML IJ SOLN
INTRAMUSCULAR | Status: DC | PRN
  Administered 2024-03-26: 2 mg via INTRAVENOUS

## 2024-03-26 MED ORDER — FENTANYL CITRATE (PF) 100 MCG/2ML IJ SOLN
INTRAMUSCULAR | Status: AC
Start: 2024-03-26 — End: ?
  Filled 2024-03-26: qty 2

## 2024-03-26 MED ORDER — LIDOCAINE HCL (PF) 1 % IJ SOLN
INTRAMUSCULAR | Status: DC | PRN
  Administered 2024-03-26: 30 mL

## 2024-03-26 MED ORDER — MIDAZOLAM HCL 2 MG/2ML IJ SOLN
INTRAMUSCULAR | Status: AC
  Filled 2024-03-26: qty 2

## 2024-03-26 MED ORDER — PROPOFOL 500 MG/50ML IV EMUL
INTRAVENOUS | Status: DC | PRN
  Administered 2024-03-26: 35 ug/kg/min via INTRAVENOUS

## 2024-03-26 MED ORDER — DEXAMETHASONE SODIUM PHOSPHATE 10 MG/ML IJ SOLN
INTRAMUSCULAR | Status: AC
Start: 2024-03-26 — End: ?
  Filled 2024-03-26: qty 1

## 2024-03-26 MED ORDER — ONDANSETRON HCL 4 MG/2ML IJ SOLN
INTRAMUSCULAR | Status: AC
  Filled 2024-03-26: qty 2

## 2024-03-26 MED ORDER — PROPOFOL 10 MG/ML IV BOLUS
INTRAVENOUS | Status: AC
Start: 2024-03-26 — End: ?
  Filled 2024-03-26: qty 20

## 2024-03-26 MED ORDER — CEFAZOLIN SODIUM-DEXTROSE 2-3 GM-%(50ML) IV SOLR
INTRAVENOUS | Status: DC | PRN
  Administered 2024-03-26: 3 g via INTRAVENOUS

## 2024-03-26 MED ORDER — LIDOCAINE 2% (20 MG/ML) 5 ML SYRINGE
INTRAMUSCULAR | Status: AC
  Filled 2024-03-26: qty 5

## 2024-03-26 MED ORDER — CEFAZOLIN SODIUM-DEXTROSE 3-4 GM/150ML-% IV SOLN
INTRAVENOUS | Status: AC
Start: 2024-03-26 — End: ?
  Filled 2024-03-26: qty 150

## 2024-03-26 MED ORDER — LACTATED RINGERS IV SOLN: INTRAVENOUS | Status: DC | PRN

## 2024-03-26 MED ORDER — GLYCOPYRROLATE PF 0.2 MG/ML IJ SOSY
PREFILLED_SYRINGE | INTRAMUSCULAR | Status: AC
  Filled 2024-03-26: qty 1

## 2024-03-26 MED ORDER — LACTATED RINGERS IV SOLN: INTRAVENOUS | Status: DC

## 2024-03-26 MED ORDER — BUPIVACAINE-EPINEPHRINE (PF) 0.25% -1:200000 IJ SOLN
INTRAMUSCULAR | Status: AC
  Filled 2024-03-26: qty 90

## 2024-03-26 MED ORDER — OXYCODONE HCL 5 MG PO TABS: 5.0000 mg | ORAL_TABLET | Freq: Four times a day (QID) | ORAL | 0 refills | Status: AC | PRN

## 2024-03-26 MED ORDER — LIDOCAINE HCL (PF) 1 % IJ SOLN
INTRAMUSCULAR | Status: AC
  Filled 2024-03-26: qty 90

## 2024-03-26 MED ORDER — ACETAMINOPHEN 500 MG PO TABS
1000.0000 mg | ORAL_TABLET | ORAL | Status: AC
  Administered 2024-03-26: 1000 mg via ORAL

## 2024-03-26 MED ORDER — 0.9 % SODIUM CHLORIDE (POUR BTL) OPTIME
TOPICAL | Status: DC | PRN
  Administered 2024-03-26: 1000 mL

## 2024-03-26 MED ORDER — PROPOFOL 10 MG/ML IV BOLUS
INTRAVENOUS | Status: AC
  Filled 2024-03-26: qty 20

## 2024-03-26 MED ORDER — ACETAMINOPHEN 500 MG PO TABS
ORAL_TABLET | ORAL | Status: AC
  Filled 2024-03-26: qty 2

## 2024-03-26 SURGICAL SUPPLY — 42 items
BLADE SURG 10 STRL SS (BLADE) ×1 IMPLANT
BLADE SURG 15 STRL LF DISP TIS (BLADE) IMPLANT
CANISTER SUC SOCK COL 7IN (MISCELLANEOUS) IMPLANT
CANISTER SUCT 1200ML W/VALVE (MISCELLANEOUS) ×1 IMPLANT
CHLORAPREP W/TINT 26 (MISCELLANEOUS) ×1 IMPLANT
CLIP TI LARGE 6 (CLIP) ×1 IMPLANT
COVER BACK TABLE 60X90IN (DRAPES) ×1 IMPLANT
COVER MAYO STAND STRL (DRAPES) ×1 IMPLANT
COVER PROBE CYLINDRICAL 5X96 (MISCELLANEOUS) ×1 IMPLANT
DERMABOND ADVANCED .7 DNX12 (GAUZE/BANDAGES/DRESSINGS) ×1 IMPLANT
DRAPE LAPAROSCOPIC ABDOMINAL (DRAPES) ×1 IMPLANT
DRAPE UTILITY XL STRL (DRAPES) ×1 IMPLANT
ELECT COATED BLADE 2.86 ST (ELECTRODE) ×1 IMPLANT
ELECTRODE REM PT RTRN 9FT ADLT (ELECTROSURGICAL) ×1 IMPLANT
GAUZE SPONGE 4X4 12PLY STRL LF (GAUZE/BANDAGES/DRESSINGS) ×1 IMPLANT
GLOVE BIO SURGEON STRL SZ 6 (GLOVE) ×1 IMPLANT
GLOVE BIOGEL PI IND STRL 6.5 (GLOVE) ×1 IMPLANT
GOWN STRL REUS W/ TWL LRG LVL3 (GOWN DISPOSABLE) ×1 IMPLANT
GOWN STRL REUS W/ TWL XL LVL3 (GOWN DISPOSABLE) ×1 IMPLANT
KIT MARKER MARGIN INK (KITS) ×1 IMPLANT
LIGHT WAVEGUIDE WIDE FLAT (MISCELLANEOUS) IMPLANT
NDL HYPO 25X1 1.5 SAFETY (NEEDLE) ×1 IMPLANT
NEEDLE HYPO 25X1 1.5 SAFETY (NEEDLE) ×1 IMPLANT
NS IRRIG 1000ML POUR BTL (IV SOLUTION) ×1 IMPLANT
PACK BASIN DAY SURGERY FS (CUSTOM PROCEDURE TRAY) ×1 IMPLANT
PENCIL SMOKE EVACUATOR (MISCELLANEOUS) ×1 IMPLANT
SLEEVE SCD COMPRESS KNEE MED (STOCKING) ×1 IMPLANT
SPIKE FLUID TRANSFER (MISCELLANEOUS) IMPLANT
SPONGE T-LAP 18X18 ~~LOC~~+RFID (SPONGE) ×1 IMPLANT
STAPLER SKIN PROX WIDE 3.9 (STAPLE) IMPLANT
STRIP CLOSURE SKIN 1/2X4 (GAUZE/BANDAGES/DRESSINGS) ×1 IMPLANT
SUT MON AB 4-0 PC3 18 (SUTURE) ×1 IMPLANT
SUT SILK 2 0 SH (SUTURE) IMPLANT
SUT VIC AB 2-0 SH 18 (SUTURE) IMPLANT
SUT VIC AB 3-0 SH 27X BRD (SUTURE) ×1 IMPLANT
SUT VICRYL 3-0 CR8 SH (SUTURE) IMPLANT
SYR BULB EAR ULCER 3OZ GRN STR (SYRINGE) ×1 IMPLANT
SYR CONTROL 10ML LL (SYRINGE) ×1 IMPLANT
TOWEL GREEN STERILE FF (TOWEL DISPOSABLE) ×1 IMPLANT
TRAY FAXITRON CT DISP (TRAY / TRAY PROCEDURE) ×1 IMPLANT
TUBE CONNECTING 20X1/4 (TUBING) ×1 IMPLANT
YANKAUER SUCT BULB TIP NO VENT (SUCTIONS) ×1 IMPLANT

## 2024-03-26 NOTE — Interval H&P Note (Signed)
 History and Physical Interval Note:  03/26/2024 8:43 AM  Darlene Bennett  has presented today for surgery, with the diagnosis of RIGHT BREAST COMPLEX SCLEROSING LESION.  The various methods of treatment have been discussed with the patient and family. After consideration of risks, benefits and other options for treatment, the patient has consented to  Procedure(s) with comments: EXCISION, MASS, BREAST, USING RADIOLOGICAL MARKER (Right) - RIGHT BREAST SEED LOCALIZED EXCISIONAL BIOPSY as a surgical intervention.  The patient's history has been reviewed, patient examined, no change in status, stable for surgery.  I have reviewed the patient's chart and labs.  Questions were answered to the patient's satisfaction.     Lockie Rima

## 2024-03-26 NOTE — Anesthesia Procedure Notes (Signed)
 Procedure Name: LMA Insertion Date/Time: 03/26/2024 9:10 AM  Performed by: Raymona Caldwell, CRNAPre-anesthesia Checklist: Patient identified, Emergency Drugs available, Suction available and Patient being monitored Patient Re-evaluated:Patient Re-evaluated prior to induction Oxygen Delivery Method: Circle system utilized Preoxygenation: Pre-oxygenation with 100% oxygen Induction Type: IV induction Ventilation: Mask ventilation without difficulty LMA: LMA inserted LMA Size: 4.0 Number of attempts: 1 Airway Equipment and Method: Bite block Placement Confirmation: positive ETCO2, CO2 detector and breath sounds checked- equal and bilateral Tube secured with: Tape Dental Injury: Teeth and Oropharynx as per pre-operative assessment

## 2024-03-26 NOTE — Op Note (Signed)
 Right Breast Radioactive seed localized excisional biopsy  Indications: This patient presents with history of abnormal right mammogram with discordant core needle biopsy.  (Complex sclerosing lesion)  Pre-operative Diagnosis: abnormal right mammogram    Post-operative Diagnosis: abnormal right mammogram  Surgeon: Lockie Rima   Anesthesia: General endotracheal anesthesia  ASA Class: 3  Procedure Details  The patient was seen in the Holding Room. The risks, benefits, complications, treatment options, and expected outcomes were discussed with the patient. The possibilities of bleeding, infection, the need for additional procedures, failure to diagnose a condition, and creating a complication requiring transfusion or operation were discussed with the patient. The patient concurred with the proposed plan, giving informed consent.  The site of surgery properly noted/marked. The patient was taken to Operating Room # 8, identified, and the procedure verified as right Breast seed localized excisional biopsy. A Time Out was held and the above information confirmed.  The right breast and chest were prepped and draped in standard fashion. A superior circumareolar incision was made near the previously placed radioactive seed.  Dissection was carried down around the point of maximum signal intensity. The cautery was used to perform the dissection.   The specimen was inked with the margin marker paint kit.    Specimen radiography confirmed inclusion of the mammographic lesion, the clip, and the seed.  The background signal in the breast was zero.  Hemostasis was achieved with cautery.  Local was infiltrated into the surrounding skin and tissue.  The wound was irrigated and closed with 3-0 vicryl interrupted deep dermal sutures and 4-0 monocryl running subcuticular suture.      Sterile dressings were applied. At the end of the operation, all sponge, instrument, and needle counts were correct.  Findings: Seed,  clip in specimen.    Estimated Blood Loss:  min         Specimens: right breast tissue with seed         Complications:  None; patient tolerated the procedure well.         Disposition: PACU - hemodynamically stable.         Condition: stable

## 2024-03-26 NOTE — Anesthesia Preprocedure Evaluation (Signed)
 Anesthesia Evaluation  Patient identified by MRN, date of birth, ID band Patient awake    Reviewed: Allergy & Precautions, NPO status , Patient's Chart, lab work & pertinent test results  Airway Mallampati: I  TM Distance: >3 FB Neck ROM: Full    Dental  (+) Dental Advisory Given, Missing,    Pulmonary Current Smoker and Patient abstained from smoking.   breath sounds clear to auscultation       Cardiovascular hypertension, Pt. on medications  Rhythm:Regular Rate:Normal     Neuro/Psych  PSYCHIATRIC DISORDERS       Neuromuscular disease CVA, No Residual Symptoms    GI/Hepatic Neg liver ROS,GERD  ,,  Endo/Other  diabetes, Type 2, Insulin  Dependent, Oral Hypoglycemic Agents    Renal/GU Renal disease     Musculoskeletal negative musculoskeletal ROS (+)    Abdominal  (+) + obese  Peds  Hematology negative hematology ROS (+)   Anesthesia Other Findings   Reproductive/Obstetrics negative OB ROS                             Lab Results  Component Value Date   WBC 7.1 10/25/2021   HGB 12.7 10/25/2021   HCT 38.3 10/25/2021   MCV 89.5 10/25/2021   PLT 376 10/25/2021   Lab Results  Component Value Date   CREATININE 0.96 03/20/2024   BUN 10 03/20/2024   NA 135 03/20/2024   K 3.8 03/20/2024   CL 101 03/20/2024   CO2 24 03/20/2024   Lab Results  Component Value Date   INR 1.0 07/20/2020    09/2016 EKG: normal sinus rhythm.   Anesthesia Physical Anesthesia Plan  ASA: 3  Anesthesia Plan: General   Post-op Pain Management: Tylenol  PO (pre-op)* and Minimal or no pain anticipated   Induction: Intravenous  PONV Risk Score and Plan: 2 and Ondansetron , Dexamethasone , Treatment may vary due to age or medical condition and Midazolam   Airway Management Planned: LMA  Additional Equipment: None  Intra-op Plan:   Post-operative Plan: Extubation in OR  Informed Consent: I have reviewed  the patients History and Physical, chart, labs and discussed the procedure including the risks, benefits and alternatives for the proposed anesthesia with the patient or authorized representative who has indicated his/her understanding and acceptance.     Dental advisory given  Plan Discussed with: CRNA  Anesthesia Plan Comments:         Anesthesia Quick Evaluation

## 2024-03-26 NOTE — Transfer of Care (Signed)
 Immediate Anesthesia Transfer of Care Note  Patient: Darlene Bennett  Procedure(s) Performed: EXCISION, MASS, BREAST, USING RADIOLOGICAL MARKER (Right: Breast)  Patient Location: PACU  Anesthesia Type:General  Level of Consciousness: awake, alert , and oriented  Airway & Oxygen Therapy: Patient Spontanous Breathing and Patient connected to face mask oxygen  Post-op Assessment: Report given to RN and Post -op Vital signs reviewed and stable  Post vital signs: Reviewed and stable  Last Vitals:  Vitals Value Taken Time  BP    Temp    Pulse    Resp    SpO2      Last Pain:  Vitals:   03/26/24 0656  TempSrc: Temporal  PainSc: 0-No pain      Patients Stated Pain Goal: 3 (03/26/24 0656)  Complications: No notable events documented.

## 2024-03-26 NOTE — Discharge Instructions (Signed)

## 2024-03-27 ENCOUNTER — Encounter (HOSPITAL_BASED_OUTPATIENT_CLINIC_OR_DEPARTMENT_OTHER): Payer: Self-pay | Admitting: General Surgery

## 2024-03-27 LAB — SURGICAL PATHOLOGY

## 2024-03-27 NOTE — Anesthesia Postprocedure Evaluation (Signed)
 Anesthesia Post Note  Patient: Doctor, hospital  Procedure(s) Performed: EXCISION, MASS, BREAST, USING RADIOLOGICAL MARKER (Right: Breast)     Patient location during evaluation: PACU Anesthesia Type: General Level of consciousness: sedated and patient cooperative Pain management: pain level controlled Vital Signs Assessment: post-procedure vital signs reviewed and stable Respiratory status: spontaneous breathing Cardiovascular status: stable Anesthetic complications: no   No notable events documented.  Last Vitals:  Vitals:   03/26/24 1015 03/26/24 1035  BP: (!) 143/86 (!) 150/96  Pulse: (!) 55 64  Resp: 18 16  Temp:  (!) 36.3 C  SpO2: 98% 98%    Last Pain:  Vitals:   03/26/24 1035  TempSrc: Temporal  PainSc: 0-No pain                 Gorman Laughter

## 2024-05-30 ENCOUNTER — Encounter (HOSPITAL_BASED_OUTPATIENT_CLINIC_OR_DEPARTMENT_OTHER): Payer: Self-pay | Admitting: General Surgery

## 2024-05-30 NOTE — OR Nursing (Signed)
 Late entry:  Due to a error with the procedure names and radioactive seeds, this procedure was documented incorrectly the day of surgery.  I reviewed the operative note dictated by the surgeon and corrected the OR record to match the completed procedure.  Berwyn Eagles, RN.

## 2024-08-15 NOTE — Progress Notes (Deleted)
 Cardiology Office Note:    Date:  08/15/2024   ID:  Darlene Bennett, DOB [birth year], MRN [id number]  PCP:  Darlene Rosina SAILOR, PA   [facility] Providers Cardiologist:  None { Click to update primary MD,subspecialty MD or APP then REFRESH:1}    Referring MD: Darlene Rosina SAILOR, PA   No chief complaint on file. ***  History of Present Illness:    Darlene Bennett is a 48 y.o. female seen at the request of Rosina Rosalea PA for evaluation of uncontrolled HTN. She has a history of HTN, gestational DM and prior CVA. Echo and event monitor at the time of her CVA in 2021 were normal.   Past Medical History:  Diagnosis Date   Blister of breast with infection, left, initial encounter 07/09/2020   Contraceptive management 05/25/2015   S/p BTL 2007 but got pregnant. Then received depo for a while. Copper  IUD placed 08/17/2020   Encounter for insertion of copper  intrauterine contraceptive device (IUD) 08/16/2020   Epidermoid cyst 08/04/2016   Gestational diabetes    insulin    Gestational diabetes mellitus, antepartum 02/14/2013   History of gestational diabetes    Hyperglycemia    Hypertension    preeclampsia with previous pregnancy   Knee pain, right 09/16/2020   Scalp cyst    Slurred speech    Stroke (HCC)    8/21, speech difficulties    Past Surgical History:  Procedure Laterality Date   BREAST BIOPSY Right 02/08/2024   MM RT BREAST BX W LOC DEV 1ST LESION IMAGE BX SPEC STEREO GUIDE 02/08/2024 [facility]   BREAST BIOPSY  03/25/2024   MM RT RADIOACTIVE SEED LOC MAMMO GUIDE 03/25/2024 [facility]   CYST EXCISION N/A 09/15/2016   Procedure: EXCISION POSTERIOR SCALP CYST;  Surgeon: Mitzie DELENA Freund, MD;  Location: [facility];  Service: General;  Laterality: N/A;   RADIOACTIVE SEED GUIDED AXILLARY SENTINEL LYMPH NODE Right 03/26/2024   Procedure: RADIOACTIVE SEED GUIDED BREAST BIOPSY;  Surgeon: Aron Shoulders, MD;  Location: [facility];  Service:  General;  Laterality: Right;  RIGHT BREAST SEED LOCALIZED EXCISIONAL BIOPSY   TUBAL LIGATION     2007    Current Medications: No outpatient medications have been marked as taking for the 08/20/24 encounter (Appointment) with [place], Kambry Takacs M, MD.     Allergies:   Patient has no known allergies.   Social History   Socioeconomic History   Marital status: Married    Spouse name: Koren   Number of children: 4   Years of education: Not on file   Highest education level: Not on file  Occupational History   Occupation: Part time    Comment: Nurse Aide  Tobacco Use   Smoking status: Some Days    Current packs/day: 0.00    Average packs/day: 0.3 packs/day for 22.0 years (5.5 ttl pk-yrs)    Types: Cigarettes    Start date: 05/1999    Last attempt to quit: 05/2021    Years since quitting: 3.2    Passive exposure: Current   Smokeless tobacco: Never   Tobacco comments:    working on it now; has cut back from 0.5, quit 05/2021  Vaping Use   Vaping status: Never Used  Substance and Sexual Activity   Alcohol use: No    Alcohol/week: 0.0 standard drinks of alcohol   Drug use: No   Sexual activity: Yes    Birth control/protection: Injection  Other Topics Concern   Not on file  Social History Narrative   Lives with husband and 2 kids   Left handed   Drinks 1 cup of caffeine daily   Social Drivers of Corporate investment banker Strain: Not on file  Food Insecurity: Not on file  Transportation Needs: Not on file  Physical Activity: Not on file  Stress: Not on file  Social Connections: Not on file     Family History: The patient's ***family history includes Diabetes in her father and mother; Heart disease in her mother; Hypertension in her father and mother. There is no history of Colon cancer, Esophageal cancer, Rectal cancer, or Stomach cancer.  ROS:   Please see the history of present illness.    *** All other systems reviewed and are negative.  EKGs/Labs/Other Studies  Reviewed:    The following studies were reviewed today: Echo 07/22/20: IMPRESSIONS     1. Left ventricular ejection fraction, by estimation, is 60 to 65%. The  left ventricle has normal function. The left ventricle has no regional  wall motion abnormalities. Left ventricular diastolic parameters were  normal.   2. Right ventricular systolic function is normal. The right ventricular  size is normal. There is normal pulmonary artery systolic pressure.   3. The mitral valve is normal in structure. No evidence of mitral valve  regurgitation. No evidence of mitral stenosis.   4. The aortic valve is normal in structure. Aortic valve regurgitation is  not visualized. No aortic stenosis is present.   5. The inferior vena cava is normal in size with greater than 50%  respiratory variability, suggesting right atrial pressure of 3 mmHg.   Event monitor 11/03/20: Study Highlights  Sinus rhythm No afib Baseline artifact limits interpretation at times      Recent Labs: 03/20/2024: BUN 10; Creatinine, Ser 0.96; Potassium 3.8; Sodium 135  Recent Lipid Panel    Component Value Date/Time   CHOL 279 (H) 07/21/2020 0556   CHOL 221 (H) 10/24/2019 1407   TRIG 288 (H) 07/21/2020 0556   HDL 29 (L) 07/21/2020 0556   HDL 35 (L) 10/24/2019 1407   CHOLHDL 9.6 07/21/2020 0556   VLDL 58 (H) 07/21/2020 0556   LDLCALC 192 (H) 07/21/2020 0556   LDLCALC 164 (H) 10/24/2019 1407     Risk Assessment/Calculations:   {Does this patient have ATRIAL FIBRILLATION?:[phone]}  No BP recorded.  {Refresh Note OR Click here to enter BP  :1}***         Physical Exam:    VS:  There were no vitals taken for this visit.    Wt Readings from Last 3 Encounters:  03/26/24 268 lb 11.9 oz (121.9 kg)  05/03/23 274 lb (124.3 kg)  05/02/23 274 lb (124.3 kg)     GEN: *** Well nourished, well developed in no acute distress HEENT: Normal NECK: No JVD; No carotid bruits LYMPHATICS: No lymphadenopathy CARDIAC:  ***RRR, no murmurs, rubs, gallops RESPIRATORY:  Clear to auscultation without rales, wheezing or rhonchi  ABDOMEN: Soft, non-tender, non-distended MUSCULOSKELETAL:  No edema; No deformity  SKIN: Warm and dry NEUROLOGIC:  Alert and oriented x 3 PSYCHIATRIC:  Normal affect   ASSESSMENT:    No diagnosis found. PLAN:    In order of problems listed above:  ***      {Are you ordering a CV Procedure (e.g. stress test, cath, DCCV, TEE, etc)?   Press F2        :[id number]}    Medication Adjustments/Labs and Tests Ordered: Current medicines are reviewed at length  with the patient today.  Concerns regarding medicines are outlined above.  No orders of the defined types were placed in this encounter.  No orders of the defined types were placed in this encounter.   There are no Patient Instructions on file for this visit.   Signed, Mistina Coatney [place], MD  08/15/2024 7:55 AM    [facility]

## 2024-08-20 ENCOUNTER — Ambulatory Visit: Attending: Cardiology | Admitting: Cardiology

## 2024-08-21 ENCOUNTER — Encounter: Payer: Self-pay | Admitting: Cardiology

## 2025-01-09 ENCOUNTER — Ambulatory Visit: Admitting: Cardiology

## 2025-01-09 NOTE — Progress Notes (Unsigned)
" °  Cardiology Office Note:  .   Date:  01/09/2025  ID:  Darlene Bennett, DOB March 05, 1976, MRN [id number] PCP: Rosalea Rosina SAILOR, PA  [facility] Providers Cardiologist:  Newman Lawrence, MD PCP: Rosalea Rosina SAILOR, PA  No chief complaint on file.    Darlene Bennett is a 49 y.o. female with *** Discussed the use of AI scribe software for clinical note transcription with the patient, who gave verbal consent to proceed.  History of Present Illness       There were no vitals filed for this visit.    ROS      Studies Reviewed: .        *** Labs 03/2024: Cr 0.96   Risk Assessment/Calculations:   {Does this patient have ATRIAL FIBRILLATION?:[phone]}    Physical Exam   VISIT DIAGNOSES: No diagnosis found.   Darlene Bennett is a 49 y.o. female with *** Assessment and Plan Assessment & Plan       {Are you ordering a CV Procedure (e.g. stress test, cath, DCCV, TEE, etc)?   Press F2        :[id number]}    No orders of the defined types were placed in this encounter.    F/u in ***  Signed, Newman JINNY Lawrence, MD  "
